# Patient Record
Sex: Male | Born: 1960 | Race: White | Hispanic: No | Marital: Married | Smoking: Former smoker
Health system: Southern US, Community
[De-identification: ages and names within clinical notes are randomized; demographics above are authoritative.]

## PROBLEM LIST (undated history)

## (undated) DIAGNOSIS — M48061 Spinal stenosis, lumbar region without neurogenic claudication: Secondary | ICD-10-CM

## (undated) DIAGNOSIS — I5189 Other ill-defined heart diseases: Secondary | ICD-10-CM

## (undated) DIAGNOSIS — R918 Other nonspecific abnormal finding of lung field: Secondary | ICD-10-CM

## (undated) DIAGNOSIS — I700 Atherosclerosis of aorta: Secondary | ICD-10-CM

## (undated) DIAGNOSIS — J439 Emphysema, unspecified: Secondary | ICD-10-CM

## (undated) DIAGNOSIS — R7303 Prediabetes: Secondary | ICD-10-CM

## (undated) DIAGNOSIS — M47816 Spondylosis without myelopathy or radiculopathy, lumbar region: Secondary | ICD-10-CM

## (undated) DIAGNOSIS — I10 Essential (primary) hypertension: Secondary | ICD-10-CM

## (undated) DIAGNOSIS — E785 Hyperlipidemia, unspecified: Secondary | ICD-10-CM

## (undated) DIAGNOSIS — I2510 Atherosclerotic heart disease of native coronary artery without angina pectoris: Secondary | ICD-10-CM

## (undated) HISTORY — DX: Essential (primary) hypertension: I10

---

## 2009-10-25 ENCOUNTER — Encounter: Payer: Self-pay | Admitting: Cardiovascular Disease

## 2009-11-17 ENCOUNTER — Ambulatory Visit: Payer: Self-pay | Admitting: Cardiovascular Disease

## 2009-11-17 DIAGNOSIS — I10 Essential (primary) hypertension: Secondary | ICD-10-CM | POA: Insufficient documentation

## 2009-11-17 DIAGNOSIS — R011 Cardiac murmur, unspecified: Secondary | ICD-10-CM

## 2010-02-20 NOTE — Letter (Signed)
Summary: [facility]   Imported By: Marylou Mccoy 12/29/2009 12:23:04  _____________________________________________________________________  External Attachment:    Type:   Image     Comment:   External Document

## 2010-02-20 NOTE — Assessment & Plan Note (Signed)
Summary: NP6/AMD   Visit Type:  Initial Consult Primary Provider:  Archinal,Ginette  CC:  Was told has a heart murmur. Denies chest pain or shortness of breath..  History of Present Illness: very pleasant 50 year old gentleman with a long history of smoking for 30 years, hypertension, who presents by referral from Dr. Andrey Spearman for murmur.  He reports that he is active, works at [employer], has no symptoms of shortness of breath or chest discomfort. Growing up, he has had no problems with activities and keeping up with other people. He denies any lower extremity edema, lightheadedness or dizziness.  He has been on blood pressure medication for low has been out of medication for some time. He was recently started on lisinopril 10 mg daily. he reports that his blood pressures at home have been in the 140/80 range. This number tends to drift downward turn the latter part of the day with systolics in the 130s.   EKG shows normal sinus rhythm with rate of 79 beats per minute, no significant ST or T wave changes  Current Medications (verified): 1)  Lisinopril 10 Mg Tabs (Lisinopril) .... One Tablet Once Daily  Allergies (verified): No Known Drug Allergies  Past History:  Past Medical History: Last updated: 11/27/2009 Hypertension  Family History: Last updated: 27-Nov-2009 Father: lung cancer; deceased Mother: emphysema. liver disease; deceased age 24  Siblings: Hypertension  Social History: Last updated: 2009-11-27 Married  Retired--Air Warehouse manager (desk job) Works at [employer] in the [employer]. Tobacco Use - Yes. smokes 1 1/2 PPD, has smoked for 30 to 4 yrs. Alcohol Use - no Drug Use - no Regular Exercise - yes  Risk Factors: Exercise: yes (11-27-2009)  Risk Factors: Smoking Status: current (11-27-2009)  Review of Systems  The patient denies fever, weight loss, weight gain, vision loss, decreased hearing, hoarseness, chest pain, syncope, dyspnea on  exertion, peripheral edema, prolonged cough, abdominal pain, incontinence, muscle weakness, depression, and enlarged lymph nodes.    Vital Signs:  Patient profile:   50 year old male Height:      63 inches Weight:      154 pounds BMI:     27.38 Pulse rate:   79 / minute BP sitting:   161 / 95  (left arm) Cuff size:   regular  Vitals Entered By: Bishop Dublin, CMA (November 17, 2009 2:39 PM)  Physical Exam  General:  Well developed, well nourished, in no acute distress. Head:  normocephalic and atraumatic Neck:  Neck supple, no JVD. No masses, thyromegaly or abnormal cervical nodes. Lungs:  Clear bilaterally to auscultation and percussion. Heart:  Non-displaced PMI, chest non-tender; regular rate and rhythm, S1, S2 with II-III/VI SEM RSB radiating to LSB and carotids, no rubs or gallops. Carotid upstroke normal, no bruit. Normal abdominal aortic size, no bruits. Femorals normal pulses, no bruits. Pedals normal pulses. No edema, no varicosities. Abdomen:  Bowel sounds positive; abdomen soft and non-tender without masses Msk:  Back normal, normal gait. Muscle strength and tone normal. Pulses:  pulses normal in all 4 extremities Extremities:  No clubbing or cyanosis. Neurologic:  Alert and oriented x 3. Skin:  Intact without lesions or rashes. Psych:  Normal affect.   Impression & Recommendations:  Problem # 1:  UNDIAGNOSED CARDIAC MURMURS (ICD-785.2) etiology of murmur is uncertain though characteristic of an aortic valve pathology. Unable to definitively exclude pulmonary valve or tricuspid valve regurgitation.   we have indicated that he may benefit from an echocardiogram to evaluate  his cardiac function, and his valves. He is out of network with [facility] and we had mentioned that we could set him up with an echocardiogram with an alternate group in [place] that is under [employer] called [facility]. He would like to call us back when he would like to have this  scheduled. He is indicated that he has followup with Dr. Andrey Spearman in the first week of November and would like to meet with her first before scheduling any testing. At anypoint we could set up the echocardiogram for him.  The results of the echocardiogram would help determine any further course of management. He is relatively asymptomatic and the echocardiogram can be done at any time.   His updated medication list for this problem includes:    Lisinopril 10 Mg Tabs (Lisinopril) ..... One tablet once daily  Orders: EKG w/ Interpretation (93000)  Problem # 2:  HYPERTENSION, BENIGN (ICD-401.1) Blood pressure is elevated today there he reports improved numbers at home. I suspect he might need an additional dose increased to have asked him to write down his numbers for Dr. Andrey Spearman before his visit with her in 2 weeks' time.  His updated medication list for this problem includes:    Lisinopril 10 Mg Tabs (Lisinopril) ..... One tablet once daily

## 2010-08-07 ENCOUNTER — Encounter: Payer: Self-pay | Admitting: Cardiovascular Disease

## 2011-09-17 ENCOUNTER — Ambulatory Visit: Payer: Self-pay | Admitting: Family Medicine

## 2013-06-11 ENCOUNTER — Ambulatory Visit: Payer: Self-pay | Admitting: Podiatry

## 2013-06-29 ENCOUNTER — Encounter: Payer: Self-pay | Admitting: Podiatry

## 2013-06-29 ENCOUNTER — Ambulatory Visit: Admitting: Podiatry

## 2013-06-29 ENCOUNTER — Encounter (INDEPENDENT_AMBULATORY_CARE_PROVIDER_SITE_OTHER): Payer: Self-pay

## 2013-06-29 VITALS — BP 109/71 | HR 93 | Resp 16 | Ht 63.0 in | Wt 140.0 lb

## 2013-06-29 DIAGNOSIS — B351 Tinea unguium: Secondary | ICD-10-CM

## 2013-06-29 DIAGNOSIS — M79609 Pain in unspecified limb: Secondary | ICD-10-CM

## 2013-06-29 DIAGNOSIS — B079 Viral wart, unspecified: Secondary | ICD-10-CM

## 2013-06-29 MED ORDER — TERBINAFINE HCL 250 MG PO TABS: 250.0000 mg | ORAL_TABLET | Freq: Every day | ORAL | Status: DC

## 2013-06-29 NOTE — Progress Notes (Signed)
Subjective:     Patient ID: Dean Obrien, male   DOB: 1960-07-06, 53 y.o.   MRN: [id number]  Foot Pain   patient presents with severe calluses underneath the right foot and yellow discolored nailbeds 1-5 both feet that can become painful. He states that the lesions are very difficult for him to walk with  and he tries to trim he only gets several weeks of relief   Review of Systems  All other systems reviewed and are negative.      Objective:   Physical Exam  Nursing note and vitals reviewed. Cardiovascular: Intact distal pulses.   Musculoskeletal: Normal range of motion.  Neurological: He is alert.  Skin: Skin is warm.   neurovascular status is intact range of motion adequate of the subtalar and midtarsal joint and no equinus condition is noted. Patient is found to have a severe lesion in the right arch around the right second metatarsal and underneath the left second metatarsal with a 1 in the arch been deepest and most painful. Nailbeds are thick yellow with discomfort upon pressure     Assessment:     Severe lesions which may be verruca in nature or porokeratosis. Mycotic nail infection 1-5 both feet    Plan:     H&P reviewed and deep debridement accomplished of lesions with chemical applied to the right lesion arch. Debridement nailbeds 1-5 both feet and placed on Lamisil 250 mg daily with blood work been ordered for patient

## 2013-06-29 NOTE — Progress Notes (Signed)
   Subjective:    Patient ID: Dean Obrien, male    DOB: [birth year], 53 y.o.   MRN: [id number]  HPI Comments: i have a callus on both of my feet and i need my toenails trimmed. i have had the callus for 2 years. They do hurt. i went to dr cline last year and he scraped the dead skin off and that was it. i have tried shoe inserts, callus pads, and i will pick the skin off. It hurts to walk. My toenails do not hurt. They have yellow fungus. Dr cline would not do anything for my toenails and would not trim them. i trim my toenails.  Foot Pain      Review of Systems  All other systems reviewed and are negative.      Objective:   Physical Exam        Assessment & Plan:

## 2013-06-30 LAB — HEPATIC FUNCTION PANEL
ALBUMIN: 4.3 g/dL (ref 3.5–5.5)
ALT: 13 IU/L (ref 0–44)
AST: 12 IU/L (ref 0–40)
Alkaline Phosphatase: 61 IU/L (ref 39–117)
Bilirubin, Direct: 0.1 mg/dL (ref 0.00–0.40)
TOTAL PROTEIN: 6.7 g/dL (ref 6.0–8.5)
Total Bilirubin: 0.2 mg/dL (ref 0.0–1.2)

## 2013-06-30 LAB — CBC WITH DIFFERENTIAL/PLATELET
BASOS: 0 %
Basophils Absolute: 0 10*3/uL (ref 0.0–0.2)
EOS ABS: 0.3 10*3/uL (ref 0.0–0.4)
Eos: 2 %
HEMATOCRIT: 42.4 % (ref 37.5–51.0)
HEMOGLOBIN: 15 g/dL (ref 12.6–17.7)
Immature Grans (Abs): 0 10*3/uL (ref 0.0–0.1)
Immature Granulocytes: 0 %
LYMPHS ABS: 2.4 10*3/uL (ref 0.7–3.1)
LYMPHS: 20 %
MCH: 31.4 pg (ref 26.6–33.0)
MCHC: 35.4 g/dL (ref 31.5–35.7)
MCV: 89 fL (ref 79–97)
MONOCYTES: 9 %
Monocytes Absolute: 1.1 10*3/uL — ABNORMAL HIGH (ref 0.1–0.9)
NEUTROS ABS: 8.1 10*3/uL — AB (ref 1.4–7.0)
Neutrophils Relative %: 69 %
RBC: 4.77 x10E6/uL (ref 4.14–5.80)
RDW: 13.1 % (ref 12.3–15.4)
WBC: 12 10*3/uL — ABNORMAL HIGH (ref 3.4–10.8)

## 2013-09-10 ENCOUNTER — Ambulatory Visit: Payer: Self-pay | Admitting: Family Medicine

## 2013-12-30 ENCOUNTER — Ambulatory Visit: Payer: Self-pay | Admitting: Gastroenterology

## 2014-08-22 ENCOUNTER — Other Ambulatory Visit: Payer: Self-pay

## 2014-08-23 MED ORDER — LORATADINE 10 MG PO TABS: 10.0000 mg | ORAL_TABLET | Freq: Every day | ORAL | Status: DC

## 2014-08-29 NOTE — Telephone Encounter (Signed)
I have tried calling this patient several times and no response.

## 2018-09-09 DIAGNOSIS — I350 Nonrheumatic aortic (valve) stenosis: Secondary | ICD-10-CM

## 2018-09-09 HISTORY — DX: Nonrheumatic aortic (valve) stenosis: I35.0

## 2019-09-08 ENCOUNTER — Emergency Department

## 2019-09-08 ENCOUNTER — Other Ambulatory Visit: Payer: Self-pay

## 2019-09-08 DIAGNOSIS — I350 Nonrheumatic aortic (valve) stenosis: Secondary | ICD-10-CM | POA: Diagnosis not present

## 2019-09-08 DIAGNOSIS — I214 Non-ST elevation (NSTEMI) myocardial infarction: Secondary | ICD-10-CM | POA: Insufficient documentation

## 2019-09-08 DIAGNOSIS — E876 Hypokalemia: Secondary | ICD-10-CM | POA: Diagnosis not present

## 2019-09-08 DIAGNOSIS — Z79899 Other long term (current) drug therapy: Secondary | ICD-10-CM | POA: Diagnosis not present

## 2019-09-08 DIAGNOSIS — R55 Syncope and collapse: Secondary | ICD-10-CM | POA: Diagnosis present

## 2019-09-08 DIAGNOSIS — F17200 Nicotine dependence, unspecified, uncomplicated: Secondary | ICD-10-CM | POA: Insufficient documentation

## 2019-09-08 DIAGNOSIS — Z20822 Contact with and (suspected) exposure to covid-19: Secondary | ICD-10-CM | POA: Insufficient documentation

## 2019-09-08 DIAGNOSIS — I248 Other forms of acute ischemic heart disease: Secondary | ICD-10-CM | POA: Diagnosis not present

## 2019-09-08 DIAGNOSIS — I10 Essential (primary) hypertension: Secondary | ICD-10-CM | POA: Diagnosis not present

## 2019-09-08 LAB — BASIC METABOLIC PANEL
Anion gap: 11 (ref 5–15)
BUN: 12 mg/dL (ref 6–20)
CO2: 25 mmol/L (ref 22–32)
Calcium: 9.2 mg/dL (ref 8.9–10.3)
Chloride: 101 mmol/L (ref 98–111)
Creatinine, Ser: 0.86 mg/dL (ref 0.61–1.24)
GFR calc Af Amer: >60 mL/min (ref >60)
GFR calc non Af Amer: >60 mL/min (ref >60)
Glucose, Bld: 104 mg/dL — ABNORMAL HIGH (ref 70–99)
Potassium: 3.2 mmol/L — ABNORMAL LOW (ref 3.5–5.1)
Sodium: 137 mmol/L (ref 135–145)

## 2019-09-08 LAB — CBC
HCT: 43.7 % (ref 39.0–52.0)
Hemoglobin: 14.9 g/dL (ref 13.0–17.0)
MCH: 31.3 pg (ref 26.0–34.0)
MCHC: 34.1 g/dL (ref 30.0–36.0)
MCV: 91.8 fL (ref 80.0–100.0)
Platelets: 180 10*3/uL (ref 150–400)
RBC: 4.76 MIL/uL (ref 4.22–5.81)
RDW: 13.1 % (ref 11.5–15.5)
WBC: 13.9 10*3/uL — ABNORMAL HIGH (ref 4.0–10.5)
nRBC: 0 % (ref 0.0–0.2)

## 2019-09-08 LAB — TROPONIN I (HIGH SENSITIVITY): Troponin I (High Sensitivity): 48 ng/L — ABNORMAL HIGH (ref <18)

## 2019-09-08 NOTE — ED Triage Notes (Addendum)
Pt arrives via [facility] from home. Pt reports he was outside in the yard cranking a chainsaw when he started to feel dizzy and nauseous. Pt reports the next thing he remembers is being woke up by EMS. Pt unsure if he fell but states he was on the floor upon awakening. Pt denies CP. Pt in NAD, skin warm and dry, speech clear. Pt arrives with 18 gauge to the [facility]

## 2019-09-09 ENCOUNTER — Observation Stay
Admission: EM | Admit: 2019-09-09 | Discharge: 2019-09-10 | Disposition: A | Discharge status: Home / Self Care | Attending: Internal Medicine | Admitting: Internal Medicine

## 2019-09-09 ENCOUNTER — Observation Stay (HOSPITAL_BASED_OUTPATIENT_CLINIC_OR_DEPARTMENT_OTHER)
Admit: 2019-09-09 | Discharge: 2019-09-09 | Disposition: A | Discharge status: Home / Self Care | Attending: Cardiovascular Disease | Admitting: Cardiovascular Disease

## 2019-09-09 ENCOUNTER — Other Ambulatory Visit: Payer: Self-pay

## 2019-09-09 DIAGNOSIS — Z87898 Personal history of other specified conditions: Secondary | ICD-10-CM | POA: Diagnosis present

## 2019-09-09 DIAGNOSIS — R55 Syncope and collapse: Secondary | ICD-10-CM | POA: Diagnosis not present

## 2019-09-09 DIAGNOSIS — I2489 Other forms of acute ischemic heart disease: Secondary | ICD-10-CM | POA: Diagnosis present

## 2019-09-09 DIAGNOSIS — I350 Nonrheumatic aortic (valve) stenosis: Secondary | ICD-10-CM

## 2019-09-09 DIAGNOSIS — E876 Hypokalemia: Secondary | ICD-10-CM

## 2019-09-09 DIAGNOSIS — I10 Essential (primary) hypertension: Secondary | ICD-10-CM | POA: Diagnosis present

## 2019-09-09 DIAGNOSIS — I248 Other forms of acute ischemic heart disease: Secondary | ICD-10-CM | POA: Diagnosis not present

## 2019-09-09 DIAGNOSIS — F17200 Nicotine dependence, unspecified, uncomplicated: Secondary | ICD-10-CM | POA: Diagnosis present

## 2019-09-09 DIAGNOSIS — I214 Non-ST elevation (NSTEMI) myocardial infarction: Secondary | ICD-10-CM

## 2019-09-09 DIAGNOSIS — I340 Nonrheumatic mitral (valve) insufficiency: Secondary | ICD-10-CM

## 2019-09-09 HISTORY — DX: Nonrheumatic aortic (valve) stenosis: I35.0

## 2019-09-09 HISTORY — DX: Non-ST elevation (NSTEMI) myocardial infarction: I21.4

## 2019-09-09 LAB — BASIC METABOLIC PANEL
Anion gap: 11 (ref 5–15)
BUN: 12 mg/dL (ref 6–20)
CO2: 23 mmol/L (ref 22–32)
Calcium: 9.2 mg/dL (ref 8.9–10.3)
Chloride: 104 mmol/L (ref 98–111)
Creatinine, Ser: 0.6 mg/dL — ABNORMAL LOW (ref 0.61–1.24)
GFR calc Af Amer: >60 mL/min (ref >60)
GFR calc non Af Amer: >60 mL/min (ref >60)
Glucose, Bld: 117 mg/dL — ABNORMAL HIGH (ref 70–99)
Potassium: 3.8 mmol/L (ref 3.5–5.1)
Sodium: 138 mmol/L (ref 135–145)

## 2019-09-09 LAB — ECHOCARDIOGRAM COMPLETE
AR max vel: 0.41 cm2
AV Area VTI: 0.42 cm2
AV Area mean vel: 0.39 cm2
AV Mean grad: 84.7 mmHg
AV Peak grad: 121.6 mmHg
Ao pk vel: 5.51 m/s
Area-P 1/2: 3.13 cm2
S' Lateral: 2.89 cm
Weight: 2081.14 oz

## 2019-09-09 LAB — URINALYSIS, COMPLETE (UACMP) WITH MICROSCOPIC
Bacteria, UA: NONE SEEN
Bilirubin Urine: NEGATIVE
Glucose, UA: NEGATIVE mg/dL
Ketones, ur: NEGATIVE mg/dL
Leukocytes,Ua: NEGATIVE
Nitrite: NEGATIVE
Protein, ur: NEGATIVE mg/dL
Specific Gravity, Urine: 1.004 — ABNORMAL LOW (ref 1.005–1.030)
Squamous Epithelial / HPF: NONE SEEN (ref 0–5)
WBC, UA: NONE SEEN WBC/hpf (ref 0–5)
pH: 7 (ref 5.0–8.0)

## 2019-09-09 LAB — CBC
HCT: 47.2 % (ref 39.0–52.0)
Hemoglobin: 16.2 g/dL (ref 13.0–17.0)
MCH: 31.3 pg (ref 26.0–34.0)
MCHC: 34.3 g/dL (ref 30.0–36.0)
MCV: 91.1 fL (ref 80.0–100.0)
Platelets: 199 10*3/uL (ref 150–400)
RBC: 5.18 MIL/uL (ref 4.22–5.81)
RDW: 13.2 % (ref 11.5–15.5)
WBC: 14.9 10*3/uL — ABNORMAL HIGH (ref 4.0–10.5)
nRBC: 0 % (ref 0.0–0.2)

## 2019-09-09 LAB — LIPID PANEL
Cholesterol: 146 mg/dL (ref 0–200)
HDL: 50 mg/dL (ref >40)
LDL Cholesterol: 87 mg/dL (ref 0–99)
Total CHOL/HDL Ratio: 2.9 RATIO
Triglycerides: 46 mg/dL (ref <150)
VLDL: 9 mg/dL (ref 0–40)

## 2019-09-09 LAB — HEPATIC FUNCTION PANEL
ALT: 15 U/L (ref 0–44)
AST: 16 U/L (ref 15–41)
Albumin: 3 g/dL — ABNORMAL LOW (ref 3.5–5.0)
Alkaline Phosphatase: 37 U/L — ABNORMAL LOW (ref 38–126)
Bilirubin, Direct: 0.1 mg/dL (ref 0.0–0.2)
Indirect Bilirubin: 0.5 mg/dL (ref 0.3–0.9)
Total Bilirubin: 0.6 mg/dL (ref 0.3–1.2)
Total Protein: 5.3 g/dL — ABNORMAL LOW (ref 6.5–8.1)

## 2019-09-09 LAB — CK: Total CK: 100 U/L (ref 49–397)

## 2019-09-09 LAB — SARS CORONAVIRUS 2 BY RT PCR (HOSPITAL ORDER, PERFORMED IN ~~LOC~~ HOSPITAL LAB): SARS Coronavirus 2: NEGATIVE

## 2019-09-09 LAB — TROPONIN I (HIGH SENSITIVITY)
Troponin I (High Sensitivity): 132 ng/L (ref <18)
Troponin I (High Sensitivity): 165 ng/L (ref <18)

## 2019-09-09 LAB — PROTIME-INR
INR: 1.1 (ref 0.8–1.2)
Prothrombin Time: 14 seconds (ref 11.4–15.2)

## 2019-09-09 LAB — TSH: TSH: 1.224 u[IU]/mL (ref 0.350–4.500)

## 2019-09-09 LAB — HEPARIN LEVEL (UNFRACTIONATED)
Heparin Unfractionated: <0.1 IU/mL — ABNORMAL LOW (ref 0.30–0.70)
Heparin Unfractionated: 0.2 IU/mL — ABNORMAL LOW (ref 0.30–0.70)

## 2019-09-09 LAB — HEMOGLOBIN A1C
Hgb A1c MFr Bld: 5.8 % — ABNORMAL HIGH (ref 4.8–5.6)
Mean Plasma Glucose: 119.76 mg/dL

## 2019-09-09 LAB — HIV ANTIBODY (ROUTINE TESTING W REFLEX): HIV Screen 4th Generation wRfx: NONREACTIVE

## 2019-09-09 LAB — APTT: aPTT: 31 seconds (ref 24–36)

## 2019-09-09 LAB — MAGNESIUM: Magnesium: 1.5 mg/dL — ABNORMAL LOW (ref 1.7–2.4)

## 2019-09-09 MED ORDER — HEPARIN BOLUS VIA INFUSION
2000.0000 [IU] | Freq: Once | INTRAVENOUS | Status: AC
  Administered 2019-09-09: 2000 [IU] via INTRAVENOUS
  Filled 2019-09-09: qty 2000

## 2019-09-09 MED ORDER — SODIUM CHLORIDE 0.9 % IV SOLN: 250.0000 mL | INTRAVENOUS | Status: DC | PRN

## 2019-09-09 MED ORDER — METOPROLOL TARTRATE 25 MG PO TABS
12.5000 mg | ORAL_TABLET | Freq: Two times a day (BID) | ORAL | Status: DC
  Administered 2019-09-09 – 2019-09-10 (×4): 12.5 mg via ORAL
  Filled 2019-09-09 (×5): qty 1

## 2019-09-09 MED ORDER — SODIUM CHLORIDE 0.9 % WEIGHT BASED INFUSION: 10.0000 mL/h | INTRAVENOUS | Status: DC

## 2019-09-09 MED ORDER — ONDANSETRON HCL 4 MG/2ML IJ SOLN: 4.0000 mg | Freq: Four times a day (QID) | INTRAMUSCULAR | Status: DC | PRN

## 2019-09-09 MED ORDER — HEPARIN (PORCINE) 25000 UT/250ML-% IV SOLN
1200.0000 [IU]/h | INTRAVENOUS | Status: DC
  Administered 2019-09-09: 800 [IU]/h via INTRAVENOUS
  Administered 2019-09-09: 1050 [IU]/h via INTRAVENOUS
  Filled 2019-09-09: qty 250

## 2019-09-09 MED ORDER — ASPIRIN 81 MG PO CHEW
81.0000 mg | CHEWABLE_TABLET | ORAL | Status: AC
  Administered 2019-09-10: 81 mg via ORAL
  Filled 2019-09-09: qty 1

## 2019-09-09 MED ORDER — HEPARIN (PORCINE) 25000 UT/250ML-% IV SOLN
INTRAVENOUS | Status: AC
  Administered 2019-09-09: 800 [IU]/h
  Filled 2019-09-09: qty 250

## 2019-09-09 MED ORDER — ACETAMINOPHEN 325 MG PO TABS: 650.0000 mg | ORAL_TABLET | ORAL | Status: DC | PRN

## 2019-09-09 MED ORDER — HEPARIN BOLUS VIA INFUSION
2000.0000 [IU] | Freq: Once | INTRAVENOUS | Status: AC
  Administered 2019-09-10: 2000 [IU] via INTRAVENOUS
  Filled 2019-09-09: qty 2000

## 2019-09-09 MED ORDER — SODIUM CHLORIDE 0.9% FLUSH
3.0000 mL | Freq: Two times a day (BID) | INTRAVENOUS | Status: DC
  Administered 2019-09-10: 3 mL via INTRAVENOUS

## 2019-09-09 MED ORDER — SODIUM CHLORIDE 0.9% FLUSH: 3.0000 mL | INTRAVENOUS | Status: DC | PRN

## 2019-09-09 MED ORDER — ASPIRIN 81 MG PO CHEW
CHEWABLE_TABLET | ORAL | Status: AC
  Administered 2019-09-09: 324 mg
  Filled 2019-09-09: qty 4

## 2019-09-09 MED ORDER — NICOTINE 21 MG/24HR TD PT24
21.0000 mg | MEDICATED_PATCH | Freq: Every day | TRANSDERMAL | Status: DC
  Administered 2019-09-09 – 2019-09-10 (×2): 21 mg via TRANSDERMAL
  Filled 2019-09-09 (×2): qty 1

## 2019-09-09 MED ORDER — ATORVASTATIN CALCIUM 20 MG PO TABS
40.0000 mg | ORAL_TABLET | Freq: Every day | ORAL | Status: DC
  Administered 2019-09-09: 40 mg via ORAL
  Filled 2019-09-09: qty 2

## 2019-09-09 MED ORDER — ASPIRIN EC 81 MG PO TBEC: 81.0000 mg | DELAYED_RELEASE_TABLET | Freq: Every day | ORAL | Status: DC

## 2019-09-09 MED ORDER — NITROGLYCERIN 0.4 MG SL SUBL: 0.4000 mg | SUBLINGUAL_TABLET | SUBLINGUAL | Status: DC | PRN

## 2019-09-09 MED ORDER — SODIUM CHLORIDE 0.9 % WEIGHT BASED INFUSION
10.0000 mL/h | INTRAVENOUS | Status: DC
  Administered 2019-09-10: 10 mL/h via INTRAVENOUS

## 2019-09-09 MED ORDER — POTASSIUM CHLORIDE CRYS ER 20 MEQ PO TBCR
40.0000 meq | EXTENDED_RELEASE_TABLET | Freq: Two times a day (BID) | ORAL | Status: AC
  Administered 2019-09-09 (×2): 40 meq via ORAL
  Filled 2019-09-09 (×2): qty 2

## 2019-09-09 MED ORDER — HEPARIN BOLUS VIA INFUSION
4000.0000 [IU] | Freq: Once | INTRAVENOUS | Status: AC
  Administered 2019-09-09: 4000 [IU] via INTRAVENOUS
  Filled 2019-09-09: qty 4000

## 2019-09-09 NOTE — ED Provider Notes (Signed)
-----------------------------------------   3:26 AM on 09/09/2019 -----------------------------------------  Patient was seen during computer downtime.  Please refer to paper H&P.  Heparin bolus and drip were initiated and patient was admitted to hospitalist services.   Paulette Blanch, MD 09/09/19 [phone]

## 2019-09-09 NOTE — H&P (Signed)
History and Physical    Dean Obrien PVV:[id number] DOB: Mar 03, 1960 DOA: 09/09/2019  PCP: Steele Sizer, MD   Patient coming from: Home  I have personally briefly reviewed patient's old medical records in [place]  Chief Complaint: Chest pain  HPI: Dean Obrien is a 59 y.o. male with medical history significant for hypertension and nicotine dependence, presenting to the ED after he developed chest pain while outside doing yard work.  The chest pain resolved with rest but when he went inside he had a syncopal episode, awakening to feel a little dizzy but without recurrence of the chest pain and decided to come in to the emergency room to be evaluated.  The pain was described as retrosternal tightness, nonradiating with no associated nausea vomiting or diaphoresis of low intensity.  Chest pain-free on arrival ED Course: On arrival, vitals were within normal limits.  Initial EKG showed no acute ST-T wave changes.  Troponin 48/132/165.  Blood work otherwise unremarkable except for slightly elevated WBC of 13,000.  Chest x-ray and head CT unremarkable.  Patient started on heparin infusion.  Hospitalist consulted for admission  Review of Systems: As per HPI otherwise all other systems on review of systems negative.    Past Medical History:  Diagnosis Date  . Hypertension     History reviewed. No pertinent surgical history.   reports that he has been smoking. He has a 52.50 pack-year smoking history. He does not have any smokeless tobacco history on file. He reports that he does not drink alcohol and does not use drugs.  Allergies  Allergen Reactions  . Benadryl [Diphenhydramine]     Racing heart, syncope    Family History  Problem Relation Age of Onset  . Emphysema Mother   . Liver disease Mother   . Lung cancer Father   . Hypertension Sister   . Hypertension Brother       Prior to Admission medications   Medication Sig Start Date End Date Taking? Authorizing  Provider  lisinopril (PRINIVIL,ZESTRIL) 10 MG tablet Take 10 mg by mouth daily.   Patient not taking: Reported on 09/09/2019    [provider]  loratadine (CLARITIN) 10 MG tablet Take 1 tablet (10 mg total) by mouth daily. Patient not taking: Reported on 09/09/2019 08/23/14   Steele Sizer, MD  terbinafine (LAMISIL) 250 MG tablet Take 1 tablet (250 mg total) by mouth daily. Patient not taking: Reported on 09/09/2019 06/29/13   Wallene Huh, DPM    Physical Exam: Vitals:   09/09/19 0115 09/09/19 0145 09/09/19 0230 09/09/19 0330  BP:      Pulse: 73 76 67 68  Resp: (!) 26 16 14 16   Temp:      TempSrc:      SpO2: 96% 93% 94% 93%  Weight:      Height:         Vitals:   09/09/19 0115 09/09/19 0145 09/09/19 0230 09/09/19 0330  BP:      Pulse: 73 76 67 68  Resp: (!) 26 16 14 16   Temp:      TempSrc:      SpO2: 96% 93% 94% 93%  Weight:      Height:          Constitutional: Alert and oriented x 3 . Not in any apparent distress HEENT:      Head: Normocephalic and atraumatic.         Eyes: PERLA, EOMI, Conjunctivae are normal. Sclera is non-icteric.  Mouth/Throat: Mucous membranes are moist.       Neck: Supple with no signs of meningismus. Cardiovascular: Regular rate and rhythm. 3/6 murmur, no gallops, or rubs. 2+ symmetrical distal pulses are present . No JVD. No LE edema Respiratory: Respiratory effort normal .Lungs sounds clear bilaterally. No wheezes, crackles, or rhonchi.  Gastrointestinal: Soft, non tender, and non distended with positive bowel sounds. No rebound or guarding. Genitourinary: No CVA tenderness. Musculoskeletal: Nontender with normal range of motion in all extremities. No cyanosis, or erythema of extremities. Neurologic: Normal speech and language. Face is symmetric. Moving all extremities. No gross focal neurologic deficits . Skin: Skin is warm, dry.  No rash or ulcers Psychiatric: Mood and affect are normal Speech and behavior are  normal   Labs on Admission: I have personally reviewed following labs and imaging studies  CBC: Recent Labs  Lab 09/08/19 1755  WBC 13.9*  HGB 14.9  HCT 43.7  MCV 91.8  PLT 277   Basic Metabolic Panel: Recent Labs  Lab 09/08/19 1755  NA 137  K 3.2*  CL 101  CO2 25  GLUCOSE 104*  BUN 12  CREATININE 0.86  CALCIUM 9.2   GFR: Estimated Creatinine Clearance: 75.4 mL/min (by C-G formula based on SCr of 0.86 mg/dL). Liver Function Tests: No results for input(s): AST, ALT, ALKPHOS, BILITOT, PROT, ALBUMIN in the last 168 hours. No results for input(s): LIPASE, AMYLASE in the last 168 hours. No results for input(s): AMMONIA in the last 168 hours. Coagulation Profile: No results for input(s): INR, PROTIME in the last 168 hours. Cardiac Enzymes: Recent Labs  Lab 09/09/19 0018  CKTOTAL 100   BNP (last 3 results) No results for input(s): PROBNP in the last 8760 hours. HbA1C: No results for input(s): HGBA1C in the last 72 hours. CBG: No results for input(s): GLUCAP in the last 168 hours. Lipid Profile: No results for input(s): CHOL, HDL, LDLCALC, TRIG, CHOLHDL, LDLDIRECT in the last 72 hours. Thyroid Function Tests: No results for input(s): TSH, T4TOTAL, FREET4, T3FREE, THYROIDAB in the last 72 hours. Anemia Panel: No results for input(s): VITAMINB12, FOLATE, FERRITIN, TIBC, IRON, RETICCTPCT in the last 72 hours. Urine analysis:    Component Value Date/Time   COLORURINE STRAW (A) 09/09/2019 0010   APPEARANCEUR CLEAR (A) 09/09/2019 0010   LABSPEC 1.004 (L) 09/09/2019 0010   PHURINE 7.0 09/09/2019 0010   GLUCOSEU NEGATIVE 09/09/2019 0010   HGBUR MODERATE (A) 09/09/2019 0010   BILIRUBINUR NEGATIVE 09/09/2019 0010   KETONESUR NEGATIVE 09/09/2019 0010   PROTEINUR NEGATIVE 09/09/2019 0010   NITRITE NEGATIVE 09/09/2019 0010   LEUKOCYTESUR NEGATIVE 09/09/2019 0010    Radiological Exams on Admission: DG Chest 2 View  Result Date: 09/08/2019 CLINICAL DATA:  Syncope.  EXAM: CHEST - 2 VIEW COMPARISON:  September 17, 2011 FINDINGS: Mild, diffuse chronic appearing increased lung markings are seen without evidence of acute infiltrate, pleural effusion or pneumothorax. The heart size and mediastinal contours are within normal limits. The visualized skeletal structures are unremarkable. IMPRESSION: No active cardiopulmonary disease. Electronically Signed   By: Virgina Norfolk M.D.   On: 09/08/2019 18:59   CT Head Wo Contrast  Result Date: 09/09/2019 CLINICAL DATA:  Altered mental status.  Dizziness and nausea. EXAM: CT HEAD WITHOUT CONTRAST TECHNIQUE: Contiguous axial images were obtained from the base of the skull through the vertex without intravenous contrast. COMPARISON:  None. FINDINGS: Brain: No intracranial hemorrhage, mass effect, or midline shift. No hydrocephalus. The basilar cisterns are patent. No evidence of territorial infarct  or acute ischemia. No extra-axial or intracranial fluid collection. Vascular: No hyperdense vessel or unexpected calcification. Skull: No fracture or focal lesion. Sinuses/Orbits: Minimal opacification of a few left ethmoid air cells. The remaining paranasal sinuses are clear. Included orbits are unremarkable. Mastoid air cells are well aerated. Other: None. IMPRESSION: Negative noncontrast head CT. Electronically Signed   By: Keith Rake M.D.   On: 09/09/2019 00:11    EKG: Independently reviewed. Interpretation : Normal sinus rhythm  Assessment/Plan 59 year old male with a history of hypertension and nicotine dependence presenting with brief episode of chest pain followed with syncopal episode.  Troponin elevation consistent with NSTEMI    NSTEMI (non-ST elevated myocardial infarction) (HCC) -Troponin 48>>132>>165.  Initial EKG with no ST-T wave changes with repeat EKG showing minimal anterior ST elevation but with no chest pain at the time -Patient had exertional chest pain followed by a syncopal episode -Received aspirin in the  emergency room -Continue heparin infusion, aspirin, metoprolol and atorvastatin -Cardiology consult    Syncope and collapse -Concern for arrhythmia related to above -Head CT was negative -Cardiac monitoring for arrhythmias.  Neurologic checks -Echocardiogram in the a.m.    HYPERTENSION, BENIGN -Patient started on metoprolol  Nicotine dependence -Nicotine patch    DVT prophylaxis: Heparin infusion Code Status: full code  Family Communication:  none  Disposition Plan: Back to previous home environment Consults called: Cardiology Status: Observation    Athena Masse MD [facility]     09/09/2019, 4:09 AM

## 2019-09-09 NOTE — ED Notes (Signed)
Pt has not reported any chest pain, n/v/d, since LOC. Pt reports he felt a little dizzy immediately after LOC, but has "felt fine since"

## 2019-09-09 NOTE — Progress Notes (Signed)
ANTICOAGULATION CONSULT NOTE -  Pharmacy Consult for Heparin Indication: chest pain/ACS  Allergies  Allergen Reactions   Benadryl [Diphenhydramine]     Racing heart, syncope    Patient Measurements: Height: 5\' 3"  (160 cm) Weight: 59 kg (130 lb 1.1 oz) IBW/kg (Calculated) : 56.9 HEPARIN DW (KG): 65.8  Vital Signs: Temp: 98.2 F (36.8 C) (08/19 2040) Temp Source: Oral (08/19 2040) BP: 129/88 (08/19 2040) Pulse Rate: 73 (08/19 2040)  Labs: Recent Labs    09/08/19 1755 09/09/19 0018 09/09/19 0120 09/09/19 0310 09/09/19 1323 09/09/19 1350 09/09/19 2100  HGB 14.9  --  16.2  --   --   --   --   HCT 43.7  --  47.2  --   --   --   --   PLT 180  --  199  --   --   --   --   APTT  --   --  31  --   --   --   --   LABPROT  --   --   --   --   --  14.0  --   INR  --   --   --   --   --  1.1  --   HEPARINUNFRC  --   --   --   --   --  <0.10* 0.20*  CREATININE 0.86  --   --   --  0.60*  --   --   CKTOTAL  --  100  --   --   --   --   --   TROPONINIHS 48* 132*  --  165*  --   --   --     Estimated Creatinine Clearance: 81 mL/min (A) (by C-G formula based on SCr of 0.6 mg/dL (L)).   Medical History: Past Medical History:  Diagnosis Date   Hypertension     Medications/Assessment: Heparin initiated for ACS.  No anticoagulants per PTA med list.  Baseline labs ordered.  Hepain bolus 4000 units x 1 then infusion at 800 units/hr Check HL ~ 6 hours after heparin started  Goal of Therapy:  Heparin level 0.3-0.7 units/ml Monitor platelets by anticoagulation protocol: Yes   Plan:  0819 2100 HL 0.20 subtherapeutic Will give 2000 unit bolus and increase rate to 1200 units/hr  Will recheck HL in 6 hours per protocol  Ena Dawley, PharmD Clinical Pharmacist 09/09/2019 11:16 PM

## 2019-09-09 NOTE — Progress Notes (Signed)
ANTICOAGULATION CONSULT NOTE - Initial Consult  Pharmacy Consult for Heparin Indication: chest pain/ACS  Allergies  Allergen Reactions  . Benadryl [Diphenhydramine]     Racing heart, syncope    Patient Measurements: Height: 5\' 3"  (160 cm) Weight: 59 kg (130 lb 1.1 oz) IBW/kg (Calculated) : 56.9 HEPARIN DW (KG): 65.8  Vital Signs: Temp: 98.2 F (36.8 C) (08/18 2228) Temp Source: Oral (08/18 1751) BP: 131/86 (08/18 2228) Pulse Rate: 68 (08/19 0330)  Labs: Recent Labs    09/08/19 1755 09/09/19 0018 09/09/19 0120 09/09/19 0310  HGB 14.9  --   --   --   HCT 43.7  --   --   --   PLT 180  --   --   --   APTT  --   --  31  --   CREATININE 0.86  --   --   --   CKTOTAL  --  100  --   --   TROPONINIHS 48* 132*  --  165*    Estimated Creatinine Clearance: 75.4 mL/min (by C-G formula based on SCr of 0.86 mg/dL).   Medical History: Past Medical History:  Diagnosis Date  . Hypertension     Medications:  (Not in a hospital admission)   Assessment: Heparin initiated for ACS.  No anticoagulants per PTA med list.  Baseline labs ordered.  Goal of Therapy:  Heparin level 0.3-0.7 units/ml Monitor platelets by anticoagulation protocol: Yes   Plan:  Hepain bolus 4000 units x 1 then infusion at 800 units/hr Check HL ~ 6 hours after heparin started  Hart Robinsons A 09/09/2019,4:22 AM

## 2019-09-09 NOTE — ED Notes (Signed)
Messaged admitting provider to ask when she wanted another troponin level drawn

## 2019-09-09 NOTE — ED Notes (Signed)
Blue top, purple top, and covid swab sent to lab

## 2019-09-09 NOTE — H&P (View-Only) (Signed)
Cardiology Consultation:   Patient ID: Dean Obrien MRN: [id number]; DOB: December 03, 1960  Admit date: 09/09/2019 Date of Consult: 09/09/2019  Primary Care Provider: Patient, No Pcp Per Primary Cardiologist: [facility], Dr. Rockey Situ Primary Electrophysiologist:  None    Patient Profile:   Dean Obrien is a 59 y.o. male with a hx of HTN, tobacco use (at least 30 years), murmur, and who is being seen today for the evaluation of chest pain with syncopal episode at the request of Dr. Damita Dunnings.  History of Present Illness:   Dean Obrien is a 59 yo male with PMH as above.  He denies any family history of early cardiac death.  He reports a strong family history of hypertension.   He was previously evaluated by [facility] cardiology/Dr. Rockey Situ 11/17/2009 after referral from Dr. Oran Rein for cardiac murmur.  During his clinic visit, a murmur was noted on exam and classified as 0-6/2 systolic ejection murmur in the right upper sternal border and radiating to left sternal border and carotids.  It was thought that this murmur most likely represented aortic valve pathology; however, exam is unable to exclude pulmonary or tricuspid valve disease.  Echo was recommended for further evaluation.    It was noted he was out of network with [facility].  Our clinic attempted to thus set him up with an echo with an alternate group in [place] under [facility], called [facility].  Documentation states he requested to call [facility] back when ready to have this echo scheduled.  He wanted to discuss obtaining an echo with Dr. Oran Rein before proceeding forward.  On review of EMR, it appears as if the echo was never obtained.   At the time of his evaluation in 2011, he denied recent or past symptoms of valvular disease, including SOB/DOE and CP.  He was working on HTN / blood pressure control; however, he has been out of medications for some time.  Clinic BP 161/95 with home SBP 130-140s. He was maintained on  lisinopril 10mg , though it was noted that he would likely need increased dosing and recommended that he monitor his blood pressure at home for now.  Today, 09/09/2019, he presents to [facility] after an episode of chest pain, dyspnea, dizziness, and subsequent syncopal event.  Yesterday, 8/18, he was outside in his yard and doing yard work for some time. He remembers it was warm but otherwise felt fine. He was using his chain saw and reports he was vigorously cranking the chainsaw to start it, which he now believes triggered the subsequent symptoms / event. After several cranks of the chainsaw, he suddenly became extremely dizzy with acute SOB/dyspnea and nauseousness. He reports brief CP that felt like a burning or tightness in the center of his chest. This lasted approximately 10 seconds before he stopped using the chainsaw and came inside.    While inside, he was very short of breath and nauseated but denies further chest pain. Due to the nausea, he remembers coming down to the ground and being on his hands and knees before he lost consciousness. He does not feel that he hit his head, given he was already down on the floor.  The syncopal event was unwitnessed; however, he states his family was in a nearby room.  They peaked in on him soon after the event, he feels, and they noticed that he was unconscious.  His daughter tells him she estimates that he was out for 1 to 2 minutes at the most, based on their knowing the  time he came in from outside.   When he regained consciousness, his family was speaking with EMS.  He felt short of breath/nausea and also noted diaphoresis after regaining consciousness but denies any further CP, dizziness, or nausea and has not had any recurrent symptoms since that time.  He has not had an episode like this in the past. He reports a h/o rare palpitations with rare PACs and PVCs noted on telemetry. He denies any h/o pnd, orthopnea, edema, weight gain, or early satiety.     In the  ED, vitals stable. EKG without acute ST/T changes. Labs showed elevated HS Tn 48  132  165, Cr 0.86, K 3.2, WBC 13.9. UA showed moderate Hgb without bacteria or WBC seen. Head CT and CXR without acute finding. He was started on IV heparin and cardiology consulted.    No current chest pain, dizziness, DOE/SOB, nausea, or diaphoresis at the time of my exam.    Heart Pathway Score:     Past Medical History:  Diagnosis Date  . Hypertension     History reviewed. No pertinent surgical history.   Home Medications:  Prior to Admission medications   Medication Sig Start Date End Date Taking? Authorizing Provider  lisinopril (PRINIVIL,ZESTRIL) 10 MG tablet Take 10 mg by mouth daily.   Patient not taking: Reported on 09/09/2019    [provider]  loratadine (CLARITIN) 10 MG tablet Take 1 tablet (10 mg total) by mouth daily. Patient not taking: Reported on 09/09/2019 08/23/14   Steele Sizer, MD  terbinafine (LAMISIL) 250 MG tablet Take 1 tablet (250 mg total) by mouth daily. Patient not taking: Reported on 09/09/2019 06/29/13   Wallene Huh, DPM    Inpatient Medications: Scheduled Meds: . [START ON 09/10/2019] aspirin EC  81 mg Oral Daily  . atorvastatin  40 mg Oral Daily  . metoprolol tartrate  12.5 mg Oral BID  . nicotine  21 mg Transdermal Daily   Continuous Infusions: . heparin 800 Units/hr (09/09/19 0424)   PRN Meds: acetaminophen, nitroGLYCERIN, ondansetron (ZOFRAN) IV  Allergies:    Allergies  Allergen Reactions  . Benadryl [Diphenhydramine]     Racing heart, syncope    Social History:   Social History   Socioeconomic History  . Marital status: Married    Spouse name: Not on file  . Number of children: Not on file  . Years of education: Not on file  . Highest education level: Not on file  Occupational History  . Occupation: Social research officer, government (desk job)    Comment: Retired  . 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 Smoking status: Current Every  Day Smoker    Packs/day: 1.50    Years: 35.00    Pack years: 52.50  Substance and Sexual Activity  . Alcohol use: No  . Drug use: No  . Sexual activity: Not on file  Other Topics Concern  . Not on file  Social History Narrative   Pt gets regular exercise.   Social Determinants of Health   Financial Resource Strain:   . Difficulty of Paying Living Expenses: Not on file  Food Insecurity:   . Worried About Charity fundraiser in the Last Year: Not on file  . Ran Out of Food in the Last Year: Not on file  Transportation Needs:   . Lack of Transportation (Medical): Not on file  . Lack of Transportation (Non-Medical): Not on file  Physical Activity:   . Days of  Exercise per Week: Not on file  . Minutes of Exercise per Session: Not on file  Stress:   . Feeling of Stress : Not on file  Social Connections:   . Frequency of Communication with Friends and Family: Not on file  . Frequency of Social Gatherings with Friends and Family: Not on file  . Attends Religious Services: Not on file  . Active Member of Clubs or Organizations: Not on file  . Attends Archivist Meetings: Not on file  . Marital Status: Not on file  Intimate Partner Violence:   . Fear of Current or Ex-Partner: Not on file  . Emotionally Abused: Not on file  . Physically Abused: Not on file  . Sexually Abused: Not on file    Family History:    Family History  Problem Relation Age of Onset  . Emphysema Mother   . Liver disease Mother   . Lung cancer Father   . Hypertension Sister   . Hypertension Brother      ROS:  Please see the history of present illness.  Review of Systems  Constitutional: Positive for diaphoresis. Negative for malaise/fatigue.       After regaining consciousness. Briefly noted and now resolved.  Respiratory: Positive for shortness of breath.   Cardiovascular: Positive for chest pain and palpitations. Negative for orthopnea, leg swelling and PND.  Gastrointestinal: Positive  for nausea. Negative for vomiting.  Neurological: Positive for dizziness and loss of consciousness.  All other systems reviewed and are negative.   All other ROS reviewed and negative.     Physical Exam/Data:   Vitals:   09/09/19 0145 09/09/19 0230 09/09/19 0330 09/09/19 0421  BP:      Pulse: 76 67 68   Resp: 16 14 16    Temp:      TempSrc:      SpO2: 93% 94% 93%   Weight:    59 kg  Height:       No intake or output data in the 24 hours ending 09/09/19 0816 Last 3 Weights 09/09/2019 09/08/2019 06/29/2013  Weight (lbs) 130 lb 1.1 oz 145 lb 140 lb  Weight (kg) 59 kg 65.772 kg 63.504 kg     Body mass index is 23.04 kg/m.  General:  Well nourished, well developed, in no acute distress. HEENT: normal Neck: no JVD Vascular: systolic ejection murmur radiating into bilateral carotids; radial pulses 2+ bilaterally Cardiac:  normal S1, S2; RRR; 6-1/6 harsh systolic ejection murmur best appreciated in RUSB and radiating to LUSB and throughout the entire cardiac exam with further radiation up into the carotids Lungs:  clear to auscultation bilaterally, no wheezing, rhonchi or rales  Abd: soft, nontender, no hepatomegaly  Ext: no edema Musculoskeletal:  No deformities, BUE and BLE strength normal and equal Skin: warm and dry  Neuro:  No focal abnormalities noted Psych:  Normal affect   EKG:  The EKG was personally reviewed and demonstrates:  NSR, 92bpm, LVH. Subsequent EKGs NSR with ST elevation in the anterior leads likely 2/2 repolarization v ischemia.  Telemetry:  Telemetry was personally reviewed and demonstrates:  NSR, PACs, PVCs  Relevant CV Studies: Pending echo  Laboratory Data:  High Sensitivity Troponin:   Recent Labs  Lab 09/08/19 1755 09/09/19 0018 09/09/19 0310  TROPONINIHS 48* 132* 165*     Cardiac EnzymesNo results for input(s): TROPONINI in the last 168 hours. No results for input(s): TROPIPOC in the last 168 hours.  Chemistry Recent Labs  Lab 09/08/19 1755  NA 137  K 3.2*  CL 101  CO2 25  GLUCOSE 104*  BUN 12  CREATININE 0.86  CALCIUM 9.2  GFRNONAA >60  GFRAA >60  ANIONGAP 11    No results for input(s): PROT, ALBUMIN, AST, ALT, ALKPHOS, BILITOT in the last 168 hours. Hematology Recent Labs  Lab 09/08/19 1755  WBC 13.9*  RBC 4.76  HGB 14.9  HCT 43.7  MCV 91.8  MCH 31.3  MCHC 34.1  RDW 13.1  PLT 180   BNPNo results for input(s): BNP, PROBNP in the last 168 hours.  DDimer No results for input(s): DDIMER in the last 168 hours.   Radiology/Studies:  DG Chest 2 View  Result Date: 09/08/2019 CLINICAL DATA:  Syncope. EXAM: CHEST - 2 VIEW COMPARISON:  September 17, 2011 FINDINGS: Mild, diffuse chronic appearing increased lung markings are seen without evidence of acute infiltrate, pleural effusion or pneumothorax. The heart size and mediastinal contours are within normal limits. The visualized skeletal structures are unremarkable. IMPRESSION: No active cardiopulmonary disease. Electronically Signed   By: Virgina Norfolk M.D.   On: 09/08/2019 18:59   CT Head Wo Contrast  Result Date: 09/09/2019 CLINICAL DATA:  Altered mental status.  Dizziness and nausea. EXAM: CT HEAD WITHOUT CONTRAST TECHNIQUE: Contiguous axial images were obtained from the base of the skull through the vertex without intravenous contrast. COMPARISON:  None. FINDINGS: Brain: No intracranial hemorrhage, mass effect, or midline shift. No hydrocephalus. The basilar cisterns are patent. No evidence of territorial infarct or acute ischemia. No extra-axial or intracranial fluid collection. Vascular: No hyperdense vessel or unexpected calcification. Skull: No fracture or focal lesion. Sinuses/Orbits: Minimal opacification of a few left ethmoid air cells. The remaining paranasal sinuses are clear. Included orbits are unremarkable. Mastoid air cells are well aerated. Other: None. IMPRESSION: Negative noncontrast head CT. Electronically Signed   By: Keith Rake M.D.   On:  09/09/2019 00:11    Assessment and Plan:   Syncopal event, unwitnessed  --No current symptoms. Syncopal event 09/08/19 after cranking his chainsaw outside. Felt nausea, CP, dizziness, SOB/DOE. He went inside and was on the floor (due to nausea) when LOC for unknown amount of time but estimated as minutes. ED Head CT / CXR unremarkable. Labs significant for elevated HS Tn and hypokalemia.  --STAT echo recommended, due to harsh SEM appreciated on exam. Suspect etiology of syncope likely 2/2 aortic valve pathology, though also considered is acute cardiac ischemic event, arrhythmia, electrolyte abnormality, and/or vasovagal etiology. Continue to cycle HS Tn as below. Monitor on telemetry. Replete K+ with goal 4.0. --Further recommendations pending echo. If valvular dz has worsened, recommend consider R/LHC this admission to evaluate further for possible AVR/TAVR.  Harsh Systolic Ejection Murmur --No current CP/SOB. As above and 3-4/6, best appreciated RUSB.  Mummur first appreciated in 2011 and asx at that time. Echo not obtained due to being out of network. Suspect aortic valve dz that has worsened since that time given his syncopal event and sx as above. --Recommend stat echo. Pending echo, possible R/LHC to further evaluate his valvular disease and cors if indicated by echo findings at that time.   CP with elevated HS Tn --No current CP. Reports a central burning in his chest while cranking his chainsaw. He denies sx like this before in the past. CP has not recurred. --Etiology of CP unknown but suspect at least in part due to worsening valvular pathology. Cannot rule out cardiac ischemic event at this time and will continue to cycle HS  Tn until peaked and down-trending. Does have risk factors for CAD, including smoker, male, and HTN.  --Will obtain echo to further evaluate for valvular dz and assess his EF / rule out any acute structural abnormalities. Will order labs for further risk factor  stratification.  --Continue IV heparin and NPO status at this time with further recommendations regarding further ischemic evaluation if indicated pending echo. Continue current medications.  Hypokalemia --Replete with goal 4.0. Check Mg with goal 2.0.  Tobacco use --Cessation recommended.   HTN --Continue current medications.   Hemoglobin in UA --Defer to IM.   For questions or updates, please contact [place] Please consult [email or web address] for contact info under     Signed, Arvil Chaco, PA-C  09/09/2019 8:16 AM

## 2019-09-09 NOTE — ED Notes (Signed)
Pt is requesting breakfast - messaged admitting provider to see if pt can have dietary order

## 2019-09-09 NOTE — Progress Notes (Signed)
*  PRELIMINARY RESULTS* Echocardiogram 2D Echocardiogram has been performed.  Sherrie Sport 09/09/2019, 11:18 AM

## 2019-09-09 NOTE — Progress Notes (Signed)
ANTICOAGULATION CONSULT NOTE - Initial Consult  Pharmacy Consult for Heparin Indication: chest pain/ACS  Allergies  Allergen Reactions  . Benadryl [Diphenhydramine]     Racing heart, syncope    Patient Measurements: Height: 5\' 3"  (160 cm) Weight: 59 kg (130 lb 1.1 oz) IBW/kg (Calculated) : 56.9 HEPARIN DW (KG): 65.8  Vital Signs: BP: 120/72 (08/19 0822) Pulse Rate: 78 (08/19 0822)  Labs: Recent Labs    09/08/19 1755 09/09/19 0018 09/09/19 0120 09/09/19 0310  HGB 14.9  --   --   --   HCT 43.7  --   --   --   PLT 180  --   --   --   APTT  --   --  31  --   CREATININE 0.86  --   --   --   CKTOTAL  --  100  --   --   TROPONINIHS 48* 132*  --  165*    Estimated Creatinine Clearance: 75.4 mL/min (by C-G formula based on SCr of 0.86 mg/dL).   Medical History: Past Medical History:  Diagnosis Date  . Hypertension     Medications/Assessment: Heparin initiated for ACS.  No anticoagulants per PTA med list.  Baseline labs ordered.  Hepain bolus 4000 units x 1 then infusion at 800 units/hr Check HL ~ 6 hours after heparin started  Goal of Therapy:  Heparin level 0.3-0.7 units/ml Monitor platelets by anticoagulation protocol: Yes   Plan:  0819 1350 HL < 0.10 Will give 2000 unit bolus and increase rate to 1050 units/hr  Will recheck HL in 6 hours per protocol  Lu Duffel, PharmD, BCPS Clinical Pharmacist 09/09/2019 2:50 PM

## 2019-09-09 NOTE — Consult Note (Signed)
Cardiology Consultation:   Patient ID: Dean Obrien MRN: [id number]; DOB: 1960/11/08  Admit date: 09/09/2019 Date of Consult: 09/09/2019  Primary Care Provider: Patient, No Pcp Per Primary Cardiologist: [facility], Dr. Rockey Situ Primary Electrophysiologist:  None    Patient Profile:   Dean Obrien is a 59 y.o. male with a hx of HTN, tobacco use (at least 30 years), murmur, and who is being seen today for the evaluation of chest pain with syncopal episode at the request of Dr. Damita Dunnings.  History of Present Illness:   Dean Obrien is a 59 yo male with PMH as above.  He denies any family history of early cardiac death.  He reports a strong family history of hypertension.   He was previously evaluated by [facility] cardiology/Dr. Rockey Situ 11/17/2009 after referral from Dr. Oran Rein for cardiac murmur.  During his clinic visit, a murmur was noted on exam and classified as 4-0/9 systolic ejection murmur in the right upper sternal border and radiating to left sternal border and carotids.  It was thought that this murmur most likely represented aortic valve pathology; however, exam is unable to exclude pulmonary or tricuspid valve disease.  Echo was recommended for further evaluation.    It was noted he was out of network with [facility].  Our clinic attempted to thus set him up with an echo with an alternate group in [place] under [facility], called [facility].  Documentation states he requested to call [facility] back when ready to have this echo scheduled.  He wanted to discuss obtaining an echo with Dr. Oran Rein before proceeding forward.  On review of EMR, it appears as if the echo was never obtained.   At the time of his evaluation in 2011, he denied recent or past symptoms of valvular disease, including SOB/DOE and CP.  He was working on HTN / blood pressure control; however, he has been out of medications for some time.  Clinic BP 161/95 with home SBP 130-140s. He was maintained on  lisinopril 10mg , though it was noted that he would likely need increased dosing and recommended that he monitor his blood pressure at home for now.  Today, 09/09/2019, he presents to [facility] after an episode of chest pain, dyspnea, dizziness, and subsequent syncopal event.  Yesterday, 8/18, he was outside in his yard and doing yard work for some time. He remembers it was warm but otherwise felt fine. He was using his chain saw and reports he was vigorously cranking the chainsaw to start it, which he now believes triggered the subsequent symptoms / event. After several cranks of the chainsaw, he suddenly became extremely dizzy with acute SOB/dyspnea and nauseousness. He reports brief CP that felt like a burning or tightness in the center of his chest. This lasted approximately 10 seconds before he stopped using the chainsaw and came inside.    While inside, he was very short of breath and nauseated but denies further chest pain. Due to the nausea, he remembers coming down to the ground and being on his hands and knees before he lost consciousness. He does not feel that he hit his head, given he was already down on the floor.  The syncopal event was unwitnessed; however, he states his family was in a nearby room.  They peaked in on him soon after the event, he feels, and they noticed that he was unconscious.  His daughter tells him she estimates that he was out for 1 to 2 minutes at the most, based on their knowing the  time he came in from outside.   When he regained consciousness, his family was speaking with EMS.  He felt short of breath/nausea and also noted diaphoresis after regaining consciousness but denies any further CP, dizziness, or nausea and has not had any recurrent symptoms since that time.  He has not had an episode like this in the past. He reports a h/o rare palpitations with rare PACs and PVCs noted on telemetry. He denies any h/o pnd, orthopnea, edema, weight gain, or early satiety.     In the  ED, vitals stable. EKG without acute ST/T changes. Labs showed elevated HS Tn 48  132  165, Cr 0.86, K 3.2, WBC 13.9. UA showed moderate Hgb without bacteria or WBC seen. Head CT and CXR without acute finding. He was started on IV heparin and cardiology consulted.    No current chest pain, dizziness, DOE/SOB, nausea, or diaphoresis at the time of my exam.    Heart Pathway Score:     Past Medical History:  Diagnosis Date   Hypertension     History reviewed. No pertinent surgical history.   Home Medications:  Prior to Admission medications   Medication Sig Start Date End Date Taking? Authorizing Provider  lisinopril (PRINIVIL,ZESTRIL) 10 MG tablet Take 10 mg by mouth daily.   Patient not taking: Reported on 09/09/2019    [provider]  loratadine (CLARITIN) 10 MG tablet Take 1 tablet (10 mg total) by mouth daily. Patient not taking: Reported on 09/09/2019 08/23/14   Steele Sizer, MD  terbinafine (LAMISIL) 250 MG tablet Take 1 tablet (250 mg total) by mouth daily. Patient not taking: Reported on 09/09/2019 06/29/13   Wallene Huh, DPM    Inpatient Medications: Scheduled Meds:  [START ON 09/10/2019] aspirin EC  81 mg Oral Daily   atorvastatin  40 mg Oral Daily   metoprolol tartrate  12.5 mg Oral BID   nicotine  21 mg Transdermal Daily   Continuous Infusions:  heparin 800 Units/hr (09/09/19 0424)   PRN Meds: acetaminophen, nitroGLYCERIN, ondansetron (ZOFRAN) IV  Allergies:    Allergies  Allergen Reactions   Benadryl [Diphenhydramine]     Racing heart, syncope    Social History:   Social History   Socioeconomic History   Marital status: Married    Spouse name: Not on file   Number of children: Not on file   Years of education: Not on file   Highest education level: Not on file  Occupational History   Occupation: Social research officer, government (desk job)    Comment: Retired   Occupation: Public house manager in the [employer]  Tobacco Use   Smoking status: Current Every  Day Smoker    Packs/day: 1.50    Years: 35.00    Pack years: 52.50  Substance and Sexual Activity   Alcohol use: No   Drug use: No   Sexual activity: Not on file  Other Topics Concern   Not on file  Social History Narrative   Pt gets regular exercise.   Social Determinants of Health   Financial Resource Strain:    Difficulty of Paying Living Expenses: Not on file  Food Insecurity:    Worried About Charity fundraiser in the Last Year: Not on file   [employer] of Food in the Last Year: Not on file  Transportation Needs:    Lack of Transportation (Medical): Not on file   Lack of Transportation (Non-Medical): Not on file  Physical Activity:    Days of  Exercise per Week: Not on file   Minutes of Exercise per Session: Not on file  Stress:    Feeling of Stress : Not on file  Social Connections:    Frequency of Communication with Friends and Family: Not on file   Frequency of Social Gatherings with Friends and Family: Not on file   Attends Religious Services: Not on file   Active Member of Clubs or Organizations: Not on file   Attends Archivist Meetings: Not on file   Marital Status: Not on file  Intimate Partner Violence:    Fear of Current or Ex-Partner: Not on file   Emotionally Abused: Not on file   Physically Abused: Not on file   Sexually Abused: Not on file    Family History:    Family History  Problem Relation Age of Onset   Emphysema Mother    Liver disease Mother    Lung cancer Father    Hypertension Sister    Hypertension Brother      ROS:  Please see the history of present illness.  Review of Systems  Constitutional: Positive for diaphoresis. Negative for malaise/fatigue.       After regaining consciousness. Briefly noted and now resolved.  Respiratory: Positive for shortness of breath.   Cardiovascular: Positive for chest pain and palpitations. Negative for orthopnea, leg swelling and PND.  Gastrointestinal: Positive  for nausea. Negative for vomiting.  Neurological: Positive for dizziness and loss of consciousness.  All other systems reviewed and are negative.   All other ROS reviewed and negative.     Physical Exam/Data:   Vitals:   09/09/19 0145 09/09/19 0230 09/09/19 0330 09/09/19 0421  BP:      Pulse: 76 67 68   Resp: 16 14 16    Temp:      TempSrc:      SpO2: 93% 94% 93%   Weight:    59 kg  Height:       No intake or output data in the 24 hours ending 09/09/19 0816 Last 3 Weights 09/09/2019 09/08/2019 06/29/2013  Weight (lbs) 130 lb 1.1 oz 145 lb 140 lb  Weight (kg) 59 kg 65.772 kg 63.504 kg     Body mass index is 23.04 kg/m.  General:  Well nourished, well developed, in no acute distress. HEENT: normal Neck: no JVD Vascular: systolic ejection murmur radiating into bilateral carotids; radial pulses 2+ bilaterally Cardiac:  normal S1, S2; RRR; 3-8/7 harsh systolic ejection murmur best appreciated in RUSB and radiating to LUSB and throughout the entire cardiac exam with further radiation up into the carotids Lungs:  clear to auscultation bilaterally, no wheezing, rhonchi or rales  Abd: soft, nontender, no hepatomegaly  Ext: no edema Musculoskeletal:  No deformities, BUE and BLE strength normal and equal Skin: warm and dry  Neuro:  No focal abnormalities noted Psych:  Normal affect   EKG:  The EKG was personally reviewed and demonstrates:  NSR, 92bpm, LVH. Subsequent EKGs NSR with ST elevation in the anterior leads likely 2/2 repolarization v ischemia.  Telemetry:  Telemetry was personally reviewed and demonstrates:  NSR, PACs, PVCs  Relevant CV Studies: Pending echo  Laboratory Data:  High Sensitivity Troponin:   Recent Labs  Lab 09/08/19 1755 09/09/19 0018 09/09/19 0310  TROPONINIHS 48* 132* 165*     Cardiac EnzymesNo results for input(s): TROPONINI in the last 168 hours. No results for input(s): TROPIPOC in the last 168 hours.  Chemistry Recent Labs  Lab 09/08/19 1755  NA 137  K 3.2*  CL 101  CO2 25  GLUCOSE 104*  BUN 12  CREATININE 0.86  CALCIUM 9.2  GFRNONAA >60  GFRAA >60  ANIONGAP 11    No results for input(s): PROT, ALBUMIN, AST, ALT, ALKPHOS, BILITOT in the last 168 hours. Hematology Recent Labs  Lab 09/08/19 1755  WBC 13.9*  RBC 4.76  HGB 14.9  HCT 43.7  MCV 91.8  MCH 31.3  MCHC 34.1  RDW 13.1  PLT 180   BNPNo results for input(s): BNP, PROBNP in the last 168 hours.  DDimer No results for input(s): DDIMER in the last 168 hours.   Radiology/Studies:  DG Chest 2 View  Result Date: 09/08/2019 CLINICAL DATA:  Syncope. EXAM: CHEST - 2 VIEW COMPARISON:  September 17, 2011 FINDINGS: Mild, diffuse chronic appearing increased lung markings are seen without evidence of acute infiltrate, pleural effusion or pneumothorax. The heart size and mediastinal contours are within normal limits. The visualized skeletal structures are unremarkable. IMPRESSION: No active cardiopulmonary disease. Electronically Signed   By: Virgina Norfolk M.D.   On: 09/08/2019 18:59   CT Head Wo Contrast  Result Date: 09/09/2019 CLINICAL DATA:  Altered mental status.  Dizziness and nausea. EXAM: CT HEAD WITHOUT CONTRAST TECHNIQUE: Contiguous axial images were obtained from the base of the skull through the vertex without intravenous contrast. COMPARISON:  None. FINDINGS: Brain: No intracranial hemorrhage, mass effect, or midline shift. No hydrocephalus. The basilar cisterns are patent. No evidence of territorial infarct or acute ischemia. No extra-axial or intracranial fluid collection. Vascular: No hyperdense vessel or unexpected calcification. Skull: No fracture or focal lesion. Sinuses/Orbits: Minimal opacification of a few left ethmoid air cells. The remaining paranasal sinuses are clear. Included orbits are unremarkable. Mastoid air cells are well aerated. Other: None. IMPRESSION: Negative noncontrast head CT. Electronically Signed   By: Keith Rake M.D.   On:  09/09/2019 00:11    Assessment and Plan:   Syncopal event, unwitnessed  --No current symptoms. Syncopal event 09/08/19 after cranking his chainsaw outside. Felt nausea, CP, dizziness, SOB/DOE. He went inside and was on the floor (due to nausea) when LOC for unknown amount of time but estimated as minutes. ED Head CT / CXR unremarkable. Labs significant for elevated HS Tn and hypokalemia.  --STAT echo recommended, due to harsh SEM appreciated on exam. Suspect etiology of syncope likely 2/2 aortic valve pathology, though also considered is acute cardiac ischemic event, arrhythmia, electrolyte abnormality, and/or vasovagal etiology. Continue to cycle HS Tn as below. Monitor on telemetry. Replete K+ with goal 4.0. --Further recommendations pending echo. If valvular dz has worsened, recommend consider R/LHC this admission to evaluate further for possible AVR/TAVR.  Harsh Systolic Ejection Murmur --No current CP/SOB. As above and 3-4/6, best appreciated RUSB.  Mummur first appreciated in 2011 and asx at that time. Echo not obtained due to being out of network. Suspect aortic valve dz that has worsened since that time given his syncopal event and sx as above. --Recommend stat echo. Pending echo, possible R/LHC to further evaluate his valvular disease and cors if indicated by echo findings at that time.   CP with elevated HS Tn --No current CP. Reports a central burning in his chest while cranking his chainsaw. He denies sx like this before in the past. CP has not recurred. --Etiology of CP unknown but suspect at least in part due to worsening valvular pathology. Cannot rule out cardiac ischemic event at this time and will continue to cycle HS  Tn until peaked and down-trending. Does have risk factors for CAD, including smoker, male, and HTN.  --Will obtain echo to further evaluate for valvular dz and assess his EF / rule out any acute structural abnormalities. Will order labs for further risk factor  stratification.  --Continue IV heparin and NPO status at this time with further recommendations regarding further ischemic evaluation if indicated pending echo. Continue current medications.  Hypokalemia --Replete with goal 4.0. Check Mg with goal 2.0.  Tobacco use --Cessation recommended.   HTN --Continue current medications.   Hemoglobin in UA --Defer to IM.   For questions or updates, please contact [place] Please consult [email or web address] for contact info under     Signed, Arvil Chaco, PA-C  09/09/2019 8:16 AM

## 2019-09-09 NOTE — ED Notes (Signed)
Pt given meal tray from dietary

## 2019-09-10 ENCOUNTER — Other Ambulatory Visit: Payer: Self-pay

## 2019-09-10 ENCOUNTER — Encounter: Payer: Self-pay | Admitting: Internal Medicine

## 2019-09-10 ENCOUNTER — Encounter: Payer: Self-pay | Admitting: Cardiology

## 2019-09-10 ENCOUNTER — Encounter
Admission: EM | Disposition: A | Discharge status: Home / Self Care | Payer: Self-pay | Source: Home / Self Care | Attending: Emergency Medicine

## 2019-09-10 ENCOUNTER — Inpatient Hospital Stay (HOSPITAL_COMMUNITY)
Admission: AD | Admit: 2019-09-10 | Discharge: 2019-09-19 | DRG: 266 | Disposition: A | Discharge status: Home / Self Care | Source: Other Acute Inpatient Hospital | Attending: Cardiothoracic Surgery | Admitting: Cardiothoracic Surgery

## 2019-09-10 DIAGNOSIS — R110 Nausea: Secondary | ICD-10-CM | POA: Diagnosis not present

## 2019-09-10 DIAGNOSIS — Z79899 Other long term (current) drug therapy: Secondary | ICD-10-CM

## 2019-09-10 DIAGNOSIS — Z8249 Family history of ischemic heart disease and other diseases of the circulatory system: Secondary | ICD-10-CM

## 2019-09-10 DIAGNOSIS — E8770 Fluid overload, unspecified: Secondary | ICD-10-CM | POA: Diagnosis not present

## 2019-09-10 DIAGNOSIS — I4891 Unspecified atrial fibrillation: Secondary | ICD-10-CM | POA: Diagnosis not present

## 2019-09-10 DIAGNOSIS — Z888 Allergy status to other drugs, medicaments and biological substances status: Secondary | ICD-10-CM | POA: Diagnosis not present

## 2019-09-10 DIAGNOSIS — Z953 Presence of xenogenic heart valve: Secondary | ICD-10-CM

## 2019-09-10 DIAGNOSIS — R011 Cardiac murmur, unspecified: Secondary | ICD-10-CM | POA: Diagnosis present

## 2019-09-10 DIAGNOSIS — I350 Nonrheumatic aortic (valve) stenosis: Secondary | ICD-10-CM | POA: Diagnosis not present

## 2019-09-10 DIAGNOSIS — F17210 Nicotine dependence, cigarettes, uncomplicated: Secondary | ICD-10-CM | POA: Diagnosis present

## 2019-09-10 DIAGNOSIS — Z720 Tobacco use: Secondary | ICD-10-CM

## 2019-09-10 DIAGNOSIS — R911 Solitary pulmonary nodule: Secondary | ICD-10-CM | POA: Diagnosis present

## 2019-09-10 DIAGNOSIS — Z951 Presence of aortocoronary bypass graft: Secondary | ICD-10-CM

## 2019-09-10 DIAGNOSIS — I2510 Atherosclerotic heart disease of native coronary artery without angina pectoris: Secondary | ICD-10-CM | POA: Diagnosis present

## 2019-09-10 DIAGNOSIS — E785 Hyperlipidemia, unspecified: Secondary | ICD-10-CM

## 2019-09-10 DIAGNOSIS — I10 Essential (primary) hypertension: Secondary | ICD-10-CM | POA: Diagnosis present

## 2019-09-10 DIAGNOSIS — Z01810 Encounter for preprocedural cardiovascular examination: Secondary | ICD-10-CM | POA: Diagnosis not present

## 2019-09-10 DIAGNOSIS — I214 Non-ST elevation (NSTEMI) myocardial infarction: Secondary | ICD-10-CM | POA: Diagnosis present

## 2019-09-10 DIAGNOSIS — Z825 Family history of asthma and other chronic lower respiratory diseases: Secondary | ICD-10-CM

## 2019-09-10 DIAGNOSIS — Z9889 Other specified postprocedural states: Secondary | ICD-10-CM

## 2019-09-10 DIAGNOSIS — Z7982 Long term (current) use of aspirin: Secondary | ICD-10-CM | POA: Diagnosis not present

## 2019-09-10 DIAGNOSIS — J939 Pneumothorax, unspecified: Secondary | ICD-10-CM

## 2019-09-10 DIAGNOSIS — R55 Syncope and collapse: Secondary | ICD-10-CM | POA: Diagnosis present

## 2019-09-10 DIAGNOSIS — E876 Hypokalemia: Secondary | ICD-10-CM | POA: Diagnosis present

## 2019-09-10 DIAGNOSIS — I083 Combined rheumatic disorders of mitral, aortic and tricuspid valves: Secondary | ICD-10-CM | POA: Diagnosis present

## 2019-09-10 DIAGNOSIS — R7309 Other abnormal glucose: Secondary | ICD-10-CM | POA: Insufficient documentation

## 2019-09-10 DIAGNOSIS — R7303 Prediabetes: Secondary | ICD-10-CM | POA: Insufficient documentation

## 2019-09-10 DIAGNOSIS — F17200 Nicotine dependence, unspecified, uncomplicated: Secondary | ICD-10-CM | POA: Diagnosis present

## 2019-09-10 DIAGNOSIS — E119 Type 2 diabetes mellitus without complications: Secondary | ICD-10-CM | POA: Diagnosis present

## 2019-09-10 DIAGNOSIS — Z87898 Personal history of other specified conditions: Secondary | ICD-10-CM

## 2019-09-10 DIAGNOSIS — D62 Acute posthemorrhagic anemia: Secondary | ICD-10-CM | POA: Diagnosis not present

## 2019-09-10 DIAGNOSIS — J90 Pleural effusion, not elsewhere classified: Secondary | ICD-10-CM

## 2019-09-10 DIAGNOSIS — Z006 Encounter for examination for normal comparison and control in clinical research program: Secondary | ICD-10-CM

## 2019-09-10 HISTORY — PX: RIGHT/LEFT HEART CATH AND CORONARY ANGIOGRAPHY: CATH118266

## 2019-09-10 HISTORY — DX: Hyperlipidemia, unspecified: E78.5

## 2019-09-10 HISTORY — DX: Other ill-defined heart diseases: I51.89

## 2019-09-10 HISTORY — DX: Prediabetes: R73.03

## 2019-09-10 HISTORY — DX: Atherosclerotic heart disease of native coronary artery without angina pectoris: I25.10

## 2019-09-10 LAB — HEPARIN LEVEL (UNFRACTIONATED): Heparin Unfractionated: 0.5 IU/mL (ref 0.30–0.70)

## 2019-09-10 LAB — GLUCOSE, CAPILLARY: Glucose-Capillary: 83 mg/dL (ref 70–99)

## 2019-09-10 LAB — APTT: aPTT: 50 seconds — ABNORMAL HIGH (ref 24–36)

## 2019-09-10 SURGERY — RIGHT/LEFT HEART CATH AND CORONARY ANGIOGRAPHY
Anesthesia: Moderate Sedation

## 2019-09-10 MED ORDER — NITROGLYCERIN 0.4 MG SL SUBL: 0.4000 mg | SUBLINGUAL_TABLET | SUBLINGUAL | 12 refills | Status: DC | PRN

## 2019-09-10 MED ORDER — SODIUM CHLORIDE 0.9 % IV SOLN: 250.0000 mL | INTRAVENOUS | Status: DC | PRN

## 2019-09-10 MED ORDER — SODIUM CHLORIDE 0.9% FLUSH: 3.0000 mL | Freq: Two times a day (BID) | INTRAVENOUS | Status: DC

## 2019-09-10 MED ORDER — ATORVASTATIN CALCIUM 40 MG PO TABS
40.0000 mg | ORAL_TABLET | Freq: Every day | ORAL | 0 refills | Status: DC
Start: 2019-09-10 — End: 2019-10-01

## 2019-09-10 MED ORDER — HEPARIN SODIUM (PORCINE) 1000 UNIT/ML IJ SOLN
INTRAMUSCULAR | Status: AC
  Filled 2019-09-10: qty 1

## 2019-09-10 MED ORDER — MAGNESIUM SULFATE 2 GM/50ML IV SOLN
2.0000 g | Freq: Once | INTRAVENOUS | Status: AC
  Administered 2019-09-10: 2 g via INTRAVENOUS
  Filled 2019-09-10: qty 50

## 2019-09-10 MED ORDER — ACETAMINOPHEN 325 MG PO TABS: 650.0000 mg | ORAL_TABLET | ORAL | Status: DC | PRN

## 2019-09-10 MED ORDER — VERAPAMIL HCL 2.5 MG/ML IV SOLN
INTRAVENOUS | Status: AC
  Filled 2019-09-10: qty 2

## 2019-09-10 MED ORDER — ASPIRIN EC 81 MG PO TBEC
81.0000 mg | DELAYED_RELEASE_TABLET | Freq: Every day | ORAL | Status: DC
  Administered 2019-09-11 – 2019-09-13 (×3): 81 mg via ORAL
  Filled 2019-09-10 (×3): qty 1

## 2019-09-10 MED ORDER — MIDAZOLAM HCL 2 MG/2ML IJ SOLN
INTRAMUSCULAR | Status: AC
  Filled 2019-09-10: qty 2

## 2019-09-10 MED ORDER — METOPROLOL TARTRATE 25 MG PO TABS
12.5000 mg | ORAL_TABLET | Freq: Two times a day (BID) | ORAL | 0 refills | Status: DC
Start: 2019-09-10 — End: 2019-09-19

## 2019-09-10 MED ORDER — LIDOCAINE HCL (PF) 1 % IJ SOLN
INTRAMUSCULAR | Status: DC | PRN
  Administered 2019-09-10: 2 mL

## 2019-09-10 MED ORDER — HEPARIN (PORCINE) 25000 UT/250ML-% IV SOLN
1300.0000 [IU]/h | INTRAVENOUS | Status: DC
  Administered 2019-09-11 – 2019-09-13 (×3): 1300 [IU]/h via INTRAVENOUS
  Filled 2019-09-10 (×4): qty 250

## 2019-09-10 MED ORDER — HEPARIN SODIUM (PORCINE) 1000 UNIT/ML IJ SOLN
INTRAMUSCULAR | Status: DC | PRN
  Administered 2019-09-10: 3500 [IU] via INTRAVENOUS

## 2019-09-10 MED ORDER — ASPIRIN 81 MG PO TBEC: 81.0000 mg | DELAYED_RELEASE_TABLET | Freq: Every day | ORAL | 11 refills | Status: AC

## 2019-09-10 MED ORDER — MIDAZOLAM HCL 2 MG/2ML IJ SOLN
INTRAMUSCULAR | Status: DC | PRN
  Administered 2019-09-10: 1 mg via INTRAVENOUS

## 2019-09-10 MED ORDER — SODIUM CHLORIDE 0.9 % IV SOLN: INTRAVENOUS | Status: AC

## 2019-09-10 MED ORDER — NITROGLYCERIN 0.4 MG SL SUBL: 0.4000 mg | SUBLINGUAL_TABLET | SUBLINGUAL | Status: DC | PRN

## 2019-09-10 MED ORDER — VERAPAMIL HCL 2.5 MG/ML IV SOLN
INTRAVENOUS | Status: DC | PRN
  Administered 2019-09-10: 2.5 mg via INTRAVENOUS

## 2019-09-10 MED ORDER — HEPARIN (PORCINE) 25000 UT/250ML-% IV SOLN
1200.0000 [IU]/h | INTRAVENOUS | Status: DC
  Administered 2019-09-10: 1200 [IU]/h via INTRAVENOUS
  Filled 2019-09-10: qty 250

## 2019-09-10 MED ORDER — NICOTINE 21 MG/24HR TD PT24: 21.0000 mg | MEDICATED_PATCH | Freq: Every day | TRANSDERMAL | 0 refills | Status: DC

## 2019-09-10 MED ORDER — HEPARIN (PORCINE) IN NACL 1000-0.9 UT/500ML-% IV SOLN
INTRAVENOUS | Status: AC
  Filled 2019-09-10: qty 1000

## 2019-09-10 MED ORDER — FENTANYL CITRATE (PF) 100 MCG/2ML IJ SOLN
INTRAMUSCULAR | Status: AC
  Filled 2019-09-10: qty 2

## 2019-09-10 MED ORDER — IOHEXOL 300 MG/ML  SOLN
INTRAMUSCULAR | Status: DC | PRN
  Administered 2019-09-10: 50 mL

## 2019-09-10 MED ORDER — HEPARIN (PORCINE) IN NACL 1000-0.9 UT/500ML-% IV SOLN
INTRAVENOUS | Status: DC | PRN
  Administered 2019-09-10: 500 mL

## 2019-09-10 MED ORDER — ATORVASTATIN CALCIUM 40 MG PO TABS
40.0000 mg | ORAL_TABLET | Freq: Every day | ORAL | Status: DC
  Administered 2019-09-11 – 2019-09-19 (×8): 40 mg via ORAL
  Filled 2019-09-10 (×8): qty 1

## 2019-09-10 MED ORDER — SODIUM CHLORIDE 0.9% FLUSH: 3.0000 mL | INTRAVENOUS | Status: DC | PRN

## 2019-09-10 MED ORDER — LIDOCAINE HCL (PF) 1 % IJ SOLN
INTRAMUSCULAR | Status: AC
  Filled 2019-09-10: qty 30

## 2019-09-10 MED ORDER — FENTANYL CITRATE (PF) 100 MCG/2ML IJ SOLN
INTRAMUSCULAR | Status: DC | PRN
  Administered 2019-09-10: 25 ug via INTRAVENOUS

## 2019-09-10 MED ORDER — METOPROLOL TARTRATE 12.5 MG HALF TABLET
12.5000 mg | ORAL_TABLET | Freq: Two times a day (BID) | ORAL | Status: DC
  Administered 2019-09-11 – 2019-09-14 (×7): 12.5 mg via ORAL
  Filled 2019-09-10 (×6): qty 1

## 2019-09-10 MED ORDER — NICOTINE 21 MG/24HR TD PT24
21.0000 mg | MEDICATED_PATCH | Freq: Every day | TRANSDERMAL | Status: DC
  Administered 2019-09-11 – 2019-09-19 (×8): 21 mg via TRANSDERMAL
  Filled 2019-09-10 (×8): qty 1

## 2019-09-10 MED ORDER — HEPARIN (PORCINE) 25000 UT/250ML-% IV SOLN: 1200.0000 [IU]/h | INTRAVENOUS | Status: DC

## 2019-09-10 MED ORDER — ONDANSETRON HCL 4 MG/2ML IJ SOLN: 4.0000 mg | Freq: Four times a day (QID) | INTRAMUSCULAR | Status: DC | PRN

## 2019-09-10 SURGICAL SUPPLY — 10 items
CATH INFINITI 5FR JK (CATHETERS) ×2 IMPLANT
CATH INFINITI JR4 5F (CATHETERS) ×2 IMPLANT
CATH SWANZ 7F THERMO (CATHETERS) ×2 IMPLANT
DEVICE RAD TR BAND REGULAR (VASCULAR PRODUCTS) ×2 IMPLANT
GLIDESHEATH SLEND SS 6F .021 (SHEATH) ×2 IMPLANT
GLIDESHEATH SLENDER 7FR .021G (SHEATH) ×2 IMPLANT
GUIDEWIRE INQWIRE 1.5J.035X260 (WIRE) IMPLANT
INQWIRE 1.5J .035X260CM (WIRE) ×3
KIT MANI 3VAL PERCEP (MISCELLANEOUS) ×3 IMPLANT
PACK CARDIAC CATH (CUSTOM PROCEDURE TRAY) ×3 IMPLANT

## 2019-09-10 NOTE — Progress Notes (Signed)
Interval history  Patient seen and examined Arrived for single vessel CABG and AVR  Briefly,  Mr Gladden is a 59 y/o male with PMH HTN, HLD, pre DM, tobacco use noted to have critical/very severe AS (AVA 0.42cm^2, Vmax 5.5 m/sMG 33mm Hg) with L/RHC today showing normal CO, normal PA#, wedge, LHC showed pRCA 90%s, pLCx 20%s, mLAD 50%s.  He is hemodynamically stable, asymptomatic now H&P as noted today We will resume his medications- aspirin, statin, metoprolol and IV heparin (given NSTEMI) Will tentatively keep him NPO after midnight. Will confirm surgical plans with the team tomorrow am (tomorrow vs on Monday or Tuesday).  Renae Fickle, MD Cardiology fellow

## 2019-09-10 NOTE — Progress Notes (Signed)
Progress Note  Patient Name: Dean Obrien Date of Encounter: 09/10/2019  [facility] Cardiologist: NEW ([place])  Subjective   No chest pain, shortness of breath or palpitations.  Inpatient Medications    Scheduled Meds: . [MAR Hold] aspirin EC  81 mg Oral Daily  . [MAR Hold] atorvastatin  40 mg Oral Daily  . [MAR Hold] metoprolol tartrate  12.5 mg Oral BID  . [MAR Hold] nicotine  21 mg Transdermal Daily  . [MAR Hold] sodium chloride flush  3 mL Intravenous Q12H   Continuous Infusions: . sodium chloride    . sodium chloride 10 mL/hr (09/10/19 8315)  . heparin     PRN Meds: sodium chloride, [MAR Hold] acetaminophen, [MAR Hold] nitroGLYCERIN, [MAR Hold] ondansetron (ZOFRAN) IV, sodium chloride flush   Vital Signs    Vitals:   09/10/19 0945 09/10/19 1002 09/10/19 1015 09/10/19 1031  BP: 111/74 108/76 118/75 111/73  Pulse:  72 74 70  Resp: 16 18 20 20   Temp:      TempSrc:      SpO2: 94% 94% 95% 96%  Weight:      Height:        Intake/Output Summary (Last 24 hours) at 09/10/2019 1048 Last data filed at 09/10/2019 0751 Gross per 24 hour  Intake 300.78 ml  Output --  Net 300.78 ml   Last 3 Weights 09/10/2019 09/09/2019 09/08/2019  Weight (lbs) 143 lb 14.4 oz 130 lb 1.1 oz 145 lb  Weight (kg) 65.273 kg 59 kg 65.772 kg      Telemetry    Normal sinus rhythm with no significant arrhythmia.- Personally Reviewed  ECG    Not performed today- Personally Reviewed  Physical Exam   GEN: No acute distress.   Neck: No JVD Cardiac: RRR, no rubs, or gallops.  3 out of 6 crescendo decrescendo systolic murmur in the aortic area which is late peaking with very diminished S2. Respiratory: Clear to auscultation bilaterally. GI: Soft, nontender, non-distended  MS: No edema; No deformity. Neuro:  Nonfocal  Psych: Normal affect   Labs    High Sensitivity Troponin:   Recent Labs  Lab 09/08/19 1755 09/09/19 0018 09/09/19 0310  TROPONINIHS 48* 132* 165*       Chemistry Recent Labs  Lab 09/08/19 1755 09/09/19 0120 09/09/19 1323  NA 137  --  138  K 3.2*  --  3.8  CL 101  --  104  CO2 25  --  23  GLUCOSE 104*  --  117*  BUN 12  --  12  CREATININE 0.86  --  0.60*  CALCIUM 9.2  --  9.2  PROT  --  5.3*  --   ALBUMIN  --  3.0*  --   AST  --  16  --   ALT  --  15  --   ALKPHOS  --  37*  --   BILITOT  --  0.6  --   GFRNONAA >60  --  >60  GFRAA >60  --  >60  ANIONGAP 11  --  11     Hematology Recent Labs  Lab 09/08/19 1755 09/09/19 0120  WBC 13.9* 14.9*  RBC 4.76 5.18  HGB 14.9 16.2  HCT 43.7 47.2  MCV 91.8 91.1  MCH 31.3 31.3  MCHC 34.1 34.3  RDW 13.1 13.2  PLT 180 199    BNPNo results for input(s): BNP, PROBNP in the last 168 hours.   DDimer No results for input(s): DDIMER in the last  168 hours.   Radiology    DG Chest 2 View  Result Date: 09/08/2019 CLINICAL DATA:  Syncope. EXAM: CHEST - 2 VIEW COMPARISON:  September 17, 2011 FINDINGS: Mild, diffuse chronic appearing increased lung markings are seen without evidence of acute infiltrate, pleural effusion or pneumothorax. The heart size and mediastinal contours are within normal limits. The visualized skeletal structures are unremarkable. IMPRESSION: No active cardiopulmonary disease. Electronically Signed   By: Virgina Norfolk M.D.   On: 09/08/2019 18:59   CT Head Wo Contrast  Result Date: 09/09/2019 CLINICAL DATA:  Altered mental status.  Dizziness and nausea. EXAM: CT HEAD WITHOUT CONTRAST TECHNIQUE: Contiguous axial images were obtained from the base of the skull through the vertex without intravenous contrast. COMPARISON:  None. FINDINGS: Brain: No intracranial hemorrhage, mass effect, or midline shift. No hydrocephalus. The basilar cisterns are patent. No evidence of territorial infarct or acute ischemia. No extra-axial or intracranial fluid collection. Vascular: No hyperdense vessel or unexpected calcification. Skull: No fracture or focal lesion. Sinuses/Orbits:  Minimal opacification of a few left ethmoid air cells. The remaining paranasal sinuses are clear. Included orbits are unremarkable. Mastoid air cells are well aerated. Other: None. IMPRESSION: Negative noncontrast head CT. Electronically Signed   By: Keith Rake M.D.   On: 09/09/2019 00:11   CARDIAC CATHETERIZATION  Result Date: 09/10/2019  Prox Cx lesion is 20% stenosed.  Mid LAD lesion is 50% stenosed.  Prox RCA lesion is 90% stenosed.  1.  Significant one-vessel coronary artery disease with 90% proximal stenosis in the right coronary artery.  Moderate mid LAD stenosis.  Moderate to heavy calcifications are noted. 2.  Severely calcified aortic valve with restricted opening.  I did not attempt to cross the aortic valve. 3.  Right heart catheterization showed normal filling pressures, normal pulmonary pressure and normal cardiac output. Recommendations: The patient has critical aortic stenosis and one-vessel coronary artery disease with associated syncope.  Given the patient's relatively young age, I suspect that he has bicuspid aortic valve. Recommend transfer to [facility] for evaluation of AVR and one-vessel CABG. Resume heparin 8 hours after sheath pull.   ECHOCARDIOGRAM COMPLETE  Result Date: 09/09/2019    ECHOCARDIOGRAM REPORT   Patient Name:   Dean Obrien Date of Exam: 09/09/2019 Medical Rec #:  [id number]       Height:       63.0 in Accession #:    [id number]      Weight:       130.1 lb Date of Birth:  Jul 10, 1960        BSA:          1.611 m Patient Age:    59 years        BP:           120/72 mmHg Patient Gender: M               HR:           78 bpm. Exam Location:  [facility] Procedure: 2D Echo, Cardiac Doppler and Color Doppler Indications:     Syncope 780.2 severe aortic valve stenosis  History:         Patient has no prior history of Echocardiogram examinations.                  Risk Factors:Hypertension.  Sonographer:     Sherrie Sport RDCS (AE) Referring Phys:  [place] Diagnosing Phys:  Ida Rogue MD IMPRESSIONS  1. Left ventricular ejection fraction, by  estimation, is 55 to 60%. The left ventricle has normal function. The left ventricle has no regional wall motion abnormalities. There is moderate left ventricular hypertrophy. Left ventricular diastolic parameters are consistent with Grade I diastolic dysfunction (impaired relaxation).  2. Right ventricular systolic function is normal. The right ventricular size is normal. There is normal pulmonary artery systolic pressure. The estimated right ventricular systolic pressure is 19.4 mmHg.  3. Mild mitral valve regurgitation.  4. The aortic valve was not well visualized. Appears heavily calcified. Aortic valve regurgitation is not visualized. Critical aortic valve stenosis. Aortic valve area, by VTI measures 0.42 cm. Aortic valve mean gradient measures 84.7 mmHg. Aortic valve Vmax measures 5.51 m/s. FINDINGS  Left Ventricle: Left ventricular ejection fraction, by estimation, is 55 to 60%. The left ventricle has normal function. The left ventricle has no regional wall motion abnormalities. The left ventricular internal cavity size was normal in size. There is  moderate left ventricular hypertrophy. Left ventricular diastolic parameters are consistent with Grade I diastolic dysfunction (impaired relaxation). Right Ventricle: The right ventricular size is normal. No increase in right ventricular wall thickness. Right ventricular systolic function is normal. There is normal pulmonary artery systolic pressure. The tricuspid regurgitant velocity is 2.19 m/s, and  with an assumed right atrial pressure of 10 mmHg, the estimated right ventricular systolic pressure is 17.4 mmHg. Left Atrium: Left atrial size was normal in size. Right Atrium: Right atrial size was normal in size. Pericardium: There is no evidence of pericardial effusion. Mitral Valve: The mitral valve is normal in structure. Normal mobility of the mitral valve leaflets. Mild mitral valve  regurgitation. No evidence of mitral valve stenosis. Tricuspid Valve: The tricuspid valve is normal in structure. Tricuspid valve regurgitation is trivial. No evidence of tricuspid stenosis. Aortic Valve: The aortic valve was not well visualized. Aortic valve regurgitation is not visualized. Severe aortic stenosis is present. Aortic valve mean gradient measures 84.7 mmHg. Aortic valve peak gradient measures 121.6 mmHg. Aortic valve area, by VTI measures 0.42 cm. Pulmonic Valve: The pulmonic valve was normal in structure. Pulmonic valve regurgitation is not visualized. No evidence of pulmonic stenosis. Aorta: The aortic root is normal in size and structure. Venous: The inferior vena cava is normal in size with greater than 50% respiratory variability, suggesting right atrial pressure of 3 mmHg. IAS/Shunts: No atrial level shunt detected by color flow Doppler.  LEFT VENTRICLE PLAX 2D LVIDd:         4.22 cm  Diastology LVIDs:         2.89 cm  LV e' lateral:   4.79 cm/s LV PW:         1.27 cm  LV E/e' lateral: 15.8 LV IVS:        1.18 cm  LV e' medial:    5.55 cm/s LVOT diam:     2.00 cm  LV E/e' medial:  13.7 LV SV:         56 LV SV Index:   35 LVOT Area:     3.14 cm  RIGHT VENTRICLE RV Basal diam:  2.92 cm RV S prime:     16.50 cm/s TAPSE (M-mode): 4.4 cm LEFT ATRIUM             Index       RIGHT ATRIUM           Index LA diam:        3.00 cm 1.86 cm/m  RA Area:     14.20 cm LA  Vol [facility]):   51.9 ml 32.23 ml/m RA Volume:   33.90 ml  21.05 ml/m LA Vol (A4C):   62.1 ml 38.56 ml/m LA Biplane Vol: 56.5 ml 35.08 ml/m  AORTIC VALVE                    PULMONIC VALVE AV Area (Vmax):    0.41 cm     PV Vmax:        0.76 m/s AV Area (Vmean):   0.39 cm     PV Peak grad:   2.3 mmHg AV Area (VTI):     0.42 cm     RVOT Peak grad: 5 mmHg AV Vmax:           551.33 cm/s AV Vmean:          441.333 cm/s AV VTI:            1.330 m AV Peak Grad:      121.6 mmHg AV Mean Grad:      84.7 mmHg LVOT Vmax:         71.80 cm/s LVOT  Vmean:        54.200 cm/s LVOT VTI:          0.179 m LVOT/AV VTI ratio: 0.13  AORTA Ao Root diam: 2.80 cm MITRAL VALVE                TRICUSPID VALVE MV Area (PHT): 3.13 cm     TR Peak grad:   19.2 mmHg MV Decel Time: 242 msec     TR Vmax:        219.00 cm/s MV E velocity: 75.80 cm/s MV A velocity: 102.00 cm/s  SHUNTS MV E/A ratio:  0.74         Systemic VTI:  0.18 m                             Systemic Diam: 2.00 cm Ida Rogue MD Electronically signed by Ida Rogue MD Signature Date/Time: 09/09/2019/1:24:57 PM    Final     Cardiac Studies   Echocardiogram was done yesterday and was personally reviewed by me: 1. Left ventricular ejection fraction, by estimation, is 55 to 60%. The  left ventricle has normal function. The left ventricle has no regional  wall motion abnormalities. There is moderate left ventricular hypertrophy.  Left ventricular diastolic  parameters are consistent with Grade I diastolic dysfunction (impaired  relaxation).  2. Right ventricular systolic function is normal. The right ventricular  size is normal. There is normal pulmonary artery systolic pressure. The  estimated right ventricular systolic pressure is 08.6 mmHg.  3. Mild mitral valve regurgitation.  4. The aortic valve was not well visualized. Appears heavily calcified.  Aortic valve regurgitation is not visualized. Critical aortic valve  stenosis. Aortic valve area, by VTI measures 0.42 cm. Aortic valve mean  gradient measures 84.7 mmHg. Aortic  valve Vmax measures 5.51 m/s.   Right heart catheterization: Angiography was performed by me today:  1.  Significant one-vessel coronary artery disease with 90% proximal stenosis in the right coronary artery.  Moderate mid LAD stenosis.  Moderate to heavy calcifications are noted. 2.  Severely calcified aortic valve with restricted opening.  I did not attempt to cross the aortic valve. 3.  Right heart catheterization showed normal filling pressures, normal  pulmonary pressure and normal cardiac output.  Recommendations: The patient has critical aortic stenosis and one-vessel coronary  artery disease with associated syncope.  Given the patient's relatively young age, I suspect that he has bicuspid aortic valve. Recommend transfer to [facility] for evaluation of AVR and one-vessel CABG. Resume heparin 8 hours after sheath pull.   Patient Profile     59 y.o. 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  Troponin was mildly elevated.  Assessment & Plan    1.  Critical aortic stenosis: Valve area of 0.42 with a mean gradient of 84 mmHg.  This is the likely culprit for his syncopal episode although arrhythmia cannot be completely excluded. Given his relatively young age, he likely has bicuspid aortic valve. Cardiac catheterization was done today which showed normal filling pressures, normal pulmonary pressures and normal cardiac output.  Coronary angiography showed moderate mid LAD stenosis and significant proximal RCA stenosis. I think his best option of management is aortic valve replacement and one-vessel CABG.  This has to be done while the patient is hospitalized as he is at high risk for sudden cardiac death. Thus, we will transfer the patient to [facility] for further management of this.  The patient was accepted by our team (Dr. Meda Coffee) at [facility].  2.  Small non-ST elevation myocardial infarction: Could be due to supply demand ischemia but there is also a hazy appearing lesion in the proximal RCA which is also calcified.  Recommend resuming heparin 8 hours after sheath pull and the patient will need one-vessel CABG.  3.  Tobacco use: Smoking cessation is recommended.       For questions or updates, please contact [place] Please consult [email or web address] for contact info under        Signed, Kathlyn [place], MD    09/10/2019, 10:48 AM

## 2019-09-10 NOTE — Progress Notes (Signed)
ANTICOAGULATION CONSULT NOTE -  Pharmacy Consult for Heparin Indication: chest pain/ACS  Allergies  Allergen Reactions  . Benadryl [Diphenhydramine]     Racing heart, syncope    Vital Signs: Temp: 98.4 F (36.9 C) (08/20 2151) Temp Source: Oral (08/20 2151) BP: 141/97 (08/20 2151) Pulse Rate: 86 (08/20 2151)  Labs: Recent Labs    09/08/19 1755 09/09/19 0018 09/09/19 0120 09/09/19 0310 09/09/19 1323 09/09/19 1350 09/09/19 2100 09/10/19 0653  HGB 14.9  --  16.2  --   --   --   --   --   HCT 43.7  --  47.2  --   --   --   --   --   PLT 180  --  199  --   --   --   --   --   APTT  --   --  31  --   --   --   --   --   LABPROT  --   --   --   --   --  14.0  --   --   INR  --   --   --   --   --  1.1  --   --   HEPARINUNFRC  --   --   --   --   --  <0.10* 0.20* 0.50  CREATININE 0.86  --   --   --  0.60*  --   --   --   CKTOTAL  --  100  --   --   --   --   --   --   TROPONINIHS 48* 132*  --  165*  --   --   --   --     Estimated Creatinine Clearance: 81 mL/min (A) (by C-G formula based on SCr of 0.6 mg/dL (L)).   Medical History: Past Medical History:  Diagnosis Date  . Aortic stenosis, severe 09/09/2018  . CAD (coronary artery disease)   . Hypertension     Medications/Assessment: Heparin initiated for ACS.  No anticoagulants per PTA med list.  Trop 65 > 165. May transfer to [facility] for expedited surgery for work up on valve surgery. Plan for RHC and LHC 8/20.   8/19 2100 HL 0.2 subtherapeutic @ 1050 units/hr gave heparin bolus 2000 units + increase to 1200 units/hr.  8/20 0653 HL 0.5 therapeutic @ 1200 units.   Patient now transferred to [facility] for surgery next week, will continue heparin at current rate.   Goal of Therapy:  Heparin level 0.3-0.7 units/ml Monitor platelets by anticoagulation protocol: Yes   Plan:  Continue heparin at 1200 units/hr Recheck heparin level with am labs. CBC daily while on heparin.   Erin Hearing PharmD., BCPS Clinical  Pharmacist 09/10/2019 10:43 PM

## 2019-09-10 NOTE — Discharge Summary (Signed)
Physician Discharge Summary  Patient ID: Dean Obrien MRN: [id number] DOB/AGE: Jun 26, 1960 59 y.o.  Admit date: 09/09/2019 Discharge date: 09/10/2019  Admission Diagnoses: Syncope and collapse                                         Non-ST elevation MI                                         Hypertension                                         Nicotine dependence  Discharge Diagnoses:  Principal Problem:   Syncope and collapse Active Problems:   HYPERTENSION, BENIGN   NSTEMI (non-ST elevated myocardial infarction) ([place])   Nicotine dependence   Critical aortic valve stenosis   Demand ischemia [facility])   Discharged Condition: stable  Hospital Course: Dean Obrien is a 59 year old male with medical history significant for hypertension and nicotine dependence, who presented to the ED after he developed chest pain while outside doing yard work.  The chest pain resolved with rest but when he went inside he had a syncopal episode, awakening to feeling a little dizzy but without recurrence of the chest pain and decided to come in to the emergency room to be evaluated.  The pain was described as retrosternal tightness, nonradiating with no associated nausea vomiting or diaphoresis of low intensity.  Chest pain-free on arrival.  Patient is to have a bump in his troponin levels and was started on a heparin drip for possible non-ST elevation MI.  Patient was seen in consultation by cardiology and had a 2D echocardiogram which showed critical aortic valve stenosis, aortic valve area measures 0.42 cm2.patient noted to have an LVEF of 55 to 60% with no regional wall motion abnormalities.  Patient is status post cardiac catheterization which showed Significant one-vessel coronary artery disease with 90% proximal stenosis in the right coronary artery.  Moderate mid LAD stenosis. Given the fact that patient has critical aortic stenosis, one-vessel coronary artery disease with associated syncope, cardiologist  has recommended transfer to [facility] for evaluation of AVR and one-vessel CABG. Patient was seen and examined at bedside postprocedure and is agreeable to the plan you are. He will be transferred to [facility] to the services of Dr. Meda Coffee once bed becomes available.    Consults: cardiology  Significant Diagnostic Studies: angiography:   Treatments: anticoagulation: LMW heparin  Discharge Exam: Blood pressure 126/86, pulse 72, temperature 97.9 F (36.6 C), temperature source Oral, resp. rate 20, height 5\' 3"  (1.6 m), weight 65.3 kg, SpO2 97 %. General appearance: appears stated age Head: Normocephalic, without obvious abnormality, atraumatic Neck: no adenopathy, no carotid bruit, no JVD, supple, symmetrical, trachea midline and thyroid not enlarged, symmetric, no tenderness/mass/nodules Resp: clear to auscultation bilaterally GI: soft, non-tender; bowel sounds normal; no masses,  no organomegaly Extremities: extremities normal, atraumatic, no cyanosis or edema  Disposition: Discharge disposition: [place] Not Defined       Discharge Instructions    Diet - low sodium heart healthy   Complete by: As directed    Discharge instructions   Complete by: As directed    Patient is  being transferred to [facility] to the services of Dr Meda Coffee for further evaluation and treatment   Increase activity slowly   Complete by: As directed      Allergies as of 09/10/2019      Reactions   Benadryl [diphenhydramine]    Racing heart, syncope      Medication List    STOP taking these medications   lisinopril 10 MG tablet Commonly known as: ZESTRIL   loratadine 10 MG tablet Commonly known as: CLARITIN   terbinafine 250 MG tablet Commonly known as: LamISIL     TAKE these medications   aspirin 81 MG EC tablet Take 1 tablet (81 mg total) by mouth daily. Swallow whole. Start taking on: September 11, 2019   atorvastatin 40 MG tablet Commonly known as:  LIPITOR Take 1 tablet (40 mg total) by mouth daily.   metoprolol tartrate 25 MG tablet Commonly known as: LOPRESSOR Take 0.5 tablets (12.5 mg total) by mouth 2 (two) times daily.   nicotine 21 mg/24hr patch Commonly known as: NICODERM CQ - dosed in mg/24 hours Place 1 patch (21 mg total) onto the skin daily.   nitroGLYCERIN 0.4 MG SL tablet Commonly known as: NITROSTAT Place 1 tablet (0.4 mg total) under the tongue every 5 (five) minutes x 3 doses as needed for chest pain.        Signed: Gennie Dib 09/10/2019, 12:28 PM

## 2019-09-10 NOTE — H&P (Addendum)
Cardiology Admission History and Physical:   Patient ID: Dean Obrien MRN: [id number]; DOB: [birth year]   Admission date: (Not on file)  Primary Care Provider: Patient, No Pcp Per Primary Cardiologist: [facility], Dr. Rockey Situ Primary Electrophysiologist:  None   Chief Complaint: Critical Aortic Stenosis, Severe 1v CAD   Patient Profile:   Dean Obrien is a 59 y.o. male with h/o critical aortic stenosis (by 8/19 echo and [facility] 09/10/19) with suspicion for bicuspid aortic valve, 1-vessel CAD (R/LHC 09/10/2019: pRCA 90%s, pLCx 20%s, mLAD 50%s), syncope 09/10/19, hypertension, HLD, newly dx prediabetes, long history of tobacco use ~30 years), and transfer from [facility] to [facility] for evaluation of AVR and 1v CABG.  History of Present Illness:   Dean Obrien is a 59 yo male with PMH as above.    He denies any family history of early cardiac death.  He reports a strong family history of hypertension.    He was previously evaluated by [facility] cardiology/Dr. Rockey Situ 11/17/2009 after referral from Dr. Oran Rein for cardiac murmur.  During his 2011 clinic visit,  he denied recent or past symptoms of valvular disease, including SOB/DOE and CP.  He was working on HTN / blood pressure control. Clinic BP 161/95 (though, in the setting of running out of his lisinopril 10mg ) with home SBP 130-140s on his ACE.  A  2-3/6 SEM was noted in his RUSB and radiating to his LUSB & b/l carotids.  It was thought that this murmur most likely represented aortic valve pathology.  Echo was recommended for further evaluation but echo was never obtained as pt was out of network and also declined the offer to schedule him with another in - network group at that time and pending discussion with PCP.   Since that time, he reports that he has been doing well. He reports a h/o rare palpitations. No reported h/o CP, syncope, dizziness, SOB/DOE, pnd, orthopnea, edema, weight gain, or early satiety.  He reports he is usually  fairly active and eats well but continues to smoke.  On 8/18, he was outside in his yard. It was warm outside but he otherwise felt fine until he he started using his chain saw. He was vigorously cranking the chainsaw to start it when he suddenly became extremely dizzy with acute SOB/dyspnea and nauseousness. He then noted transient 10 second CP that felt like a burning or tightness in the center of his chest and self-resolved. He immediately went inside, where he was still very short of breath and nauseated, so got on his hands and knees on the ground when he believes he lost consciousness (unwitnessed). He is unsure if he hit his head at that time but does note he was low to the ground. Estimated length of LOC unknown; however, he states his family was in a nearby room, and his daughter reportedly told him she estimated LOC ~1 -2 minutes, based on when he came in from outside. When he regained consciousness, his family was already speaking with EMS that had arrived. He remembers he still felt SOB/nausea with some diaphoresis at that time but denied further CP, dizziness, or nausea.    In the [facility] ED 09/09/19, vitals were stable. EKG without acute ST/T changes. Labs showed mildly elevated HS Tn 48  132  165, stable renal function with Cr 0.86, hypokalemia with K 3.2, and leukocytosis with WBC 13.9. UA showed moderate Hgb without bacteria / WBC. Head CT /CXR unremarkable. He was started on IV heparin. When seen for  cardiology consultation, he denied recurrent sx, including CP.  K+ repletion initiated. Given the harsh 3/6 systolic murmur heard on exam and suspicion that syncopal event likely 2/2 worsening aortic valve dz, STAT echocardiogram was ordered to assess his aortic valve.   Echo 09/09/19 showed critical aortic stenosis with aortic valve area by VTI measured 0.42 cm.  Aortic valve mean gradient 84.7 mmHg.  Aortic valve V-max measured 5.51 m/s.  LVEF 55 to 60%, NRWMA, G1DD, PASP 29.2 mmHg, mild MR. Given  his critical aortic stenosis, he was scheduled for [facility] 09/10/2019 to evaluate candidacy for AVR versus TAVR.   R/LHC 09/10/19 at [facility] showed significant 1v CAD with pRCA 90%s, pLCx 20%s, and moderate mLAD 50%s.  He had a severely calcified aortic valve with restricted opening.  Crossing of the aortic valve was not attempted.  RHC showed normal filling pressures, normal pulmonary pressure, and normal cardiac output.    Given the critical aortic stenosis and 1v CAD with associated syncope, as well as the patient's relatively young age, bicuspid aortic valve was suspected.  Recommendation was to transfer to [facility] for evaluation of AVR and 1V CABG.  [facility] Cardiology team, [facility], and [facility] were notified of the plan for transfer.   Plan is to resume IV heparin 8 hours s/p sheath pull or approximately 6 PM tonight. Transfer orders placed and pended for [facility]. During [facility] admission, labs performed for further risk factor stratification noted elevated A1c 5.8 with recommendations for strict glycemic control and follow-up per PCP as below. Started on statin for elevated LDL and recommendation for follow-up labs as below.  Pending BMET today for reassessment of hypokalemia noted at presentation.   Past Medical History:  Diagnosis Date  . Aortic stenosis, severe 09/09/2018  . CAD (coronary artery disease)   . Hypertension     Past Surgical History:  Procedure Laterality Date  . RIGHT/LEFT HEART CATH AND CORONARY ANGIOGRAPHY N/A 09/10/2019   Procedure: RIGHT/LEFT HEART CATH AND CORONARY ANGIOGRAPHY;  Surgeon: Wellington Hampshire, MD;  Location: [place] CV LAB;  Service: Cardiovascular;  Laterality: N/A;     Medications Prior to Admission: Prior to Admission medications   Medication Sig Start Date End Date Taking? Authorizing Provider  aspirin EC 81 MG EC tablet Take 1 tablet (81 mg total) by mouth daily. Swallow whole. 09/11/19   Agbata, Royce Macadamia, MD  atorvastatin (LIPITOR) 40 MG  tablet Take 1 tablet (40 mg total) by mouth daily. 09/10/19 10/10/19  Agbata, Royce Macadamia, MD  lisinopril (PRINIVIL,ZESTRIL) 10 MG tablet Take 10 mg by mouth daily.   Patient not taking: Reported on 09/09/2019    [provider]  loratadine (CLARITIN) 10 MG tablet Take 1 tablet (10 mg total) by mouth daily. Patient not taking: Reported on 09/09/2019 08/23/14   Steele Sizer, MD  metoprolol tartrate (LOPRESSOR) 25 MG tablet Take 0.5 tablets (12.5 mg total) by mouth 2 (two) times daily. 09/10/19 10/10/19  Agbata, Royce Macadamia, MD  nicotine (NICODERM CQ - DOSED IN MG/24 HOURS) 21 mg/24hr patch Place 1 patch (21 mg total) onto the skin daily. 09/10/19   Collier Bullock, MD  nitroGLYCERIN (NITROSTAT) 0.4 MG SL tablet Place 1 tablet (0.4 mg total) under the tongue every 5 (five) minutes x 3 doses as needed for chest pain. 09/10/19 10/10/19  Agbata, Royce Macadamia, MD  terbinafine (LAMISIL) 250 MG tablet Take 1 tablet (250 mg total) by mouth daily. Patient not taking: Reported on 09/09/2019 06/29/13   Wallene Huh, DPM  Allergies:    Allergies  Allergen Reactions  . Benadryl [Diphenhydramine]     Racing heart, syncope    Social History:   Social History   Socioeconomic History  . Marital status: Married    Spouse name: Not on file  . Number of children: Not on file  . Years of education: Not on file  . Highest education level: Not on file  Occupational History  . Occupation: Social research officer, government (desk job)    Comment: Retired  . 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 Smoking status: Current Every Day Smoker    Packs/day: 1.50    Years: 35.00    Pack years: 52.50  . Smokeless tobacco: Never Used  Vaping Use  . Vaping Use: Never used  Substance and Sexual Activity  . Alcohol use: No  . Drug use: No  . Sexual activity: Yes    Partners: Female    Birth control/protection: None  Other Topics Concern  . Not on file  Social History Narrative   Pt gets regular exercise.   Social  Determinants of Health   Financial Resource Strain:   . Difficulty of Paying Living Expenses: Not on file  Food Insecurity:   . Worried About Charity fundraiser in the Last Year: Not on file  . Ran Out of Food in the Last Year: Not on file  Transportation Needs:   . Lack of Transportation (Medical): Not on file  . Lack of Transportation (Non-Medical): Not on file  Physical Activity:   . Days of Exercise per Week: Not on file  . Minutes of Exercise per Session: Not on file  Stress:   . Feeling of Stress : Not on file  Social Connections:   . Frequency of Communication with Friends and Family: Not on file  . Frequency of Social Gatherings with Friends and Family: Not on file  . Attends Religious Services: Not on file  . Active Member of Clubs or Organizations: Not on file  . Attends Archivist Meetings: Not on file  . Marital Status: Not on file  Intimate Partner Violence:   . Fear of Current or Ex-Partner: Not on file  . Emotionally Abused: Not on file  . Physically Abused: Not on file  . Sexually Abused: Not on file    Family History:   The patient's family history includes Emphysema in his mother; Hypertension in his brother and sister; Liver disease in his mother; Lung cancer in his father.    He denies any family history of early cardiac death.  He reports a strong family history of hypertension.    ROS:  Please see the history of present illness.   Today, he denies current chest pain, palpitations, dyspnea, pnd, orthopnea, n, v, dizziness, syncope, edema, weight gain, or early satiety.   Physical Exam/Data:  There were no vitals filed for this visit. No intake or output data in the 24 hours ending 09/10/19 1739 Last 3 Weights 09/10/2019 09/09/2019 09/08/2019  Weight (lbs) 143 lb 14.4 oz 130 lb 1.1 oz 145 lb  Weight (kg) 65.273 kg 59 kg 65.772 kg     There is no height or weight on file to calculate BMI.  Please refer to MD rounding / progress note from today  for physical exam.  EKG:  No new tracings. The ECG that was done 09/09/19 was personally reviewed and demonstrates NSR, 92bpm, LVH, no acute ST/T changes.  Relevant CV Studies:  Echocardiogram  09/09/19 1. Left ventricular ejection fraction, by estimation, is 55 to 60%. The  left ventricle has normal function. The left ventricle has no regional  wall motion abnormalities. There is moderate left ventricular hypertrophy.  Left ventricular diastolic  parameters are consistent with Grade I diastolic dysfunction (impaired  relaxation).  2. Right ventricular systolic function is normal. The right ventricular  size is normal. There is normal pulmonary artery systolic pressure. The  estimated right ventricular systolic pressure is 02.7 mmHg.  3. Mild mitral valve regurgitation.  4. The aortic valve was not well visualized. Appears heavily calcified.  Aortic valve regurgitation is not visualized. Critical aortic valve  stenosis. Aortic valve area, by VTI measures 0.42 cm. Aortic valve mean  gradient measures 84.7 mmHg. Aortic  valve Vmax measures 5.51 m/s.   Right / Left heart catheterization 09/10/19 1. Significant one-vessel coronary artery disease with 90% proximal stenosis in the right coronary artery. Moderate mid LAD stenosis. Moderate to heavy calcifications are noted. 2. Severely calcified aortic valve with restricted opening. I did not attempt to cross the aortic valve. 3. Right heart catheterization showed normal filling pressures, normal pulmonary pressure and normal cardiac output. --Recommendations: The patient has critical aortic stenosis and one-vessel coronary artery disease with associated syncope. Given the patient's relatively young age, I suspect that he has bicuspid aortic valve. Recommend transfer to [facility] for evaluation of AVR and one-vessel CABG. Resume heparin 8 hours after sheath pull.  Laboratory Data:  High Sensitivity Troponin:   Recent Labs  Lab  09/08/19 1755 09/09/19 0018 09/09/19 0310  TROPONINIHS 48* 132* 165*      Cardiac EnzymesNo results for input(s): TROPONINI in the last 168 hours. No results for input(s): TROPIPOC in the last 168 hours.  Chemistry Recent Labs  Lab 09/08/19 1755 09/09/19 1323  NA 137 138  K 3.2* 3.8  CL 101 104  CO2 25 23  GLUCOSE 104* 117*  BUN 12 12  CREATININE 0.86 0.60*  CALCIUM 9.2 9.2  GFRNONAA >60 >60  GFRAA >60 >60  ANIONGAP 11 11    Recent Labs  Lab 09/09/19 0120  PROT 5.3*  ALBUMIN 3.0*  AST 16  ALT 15  ALKPHOS 37*  BILITOT 0.6   Hematology Recent Labs  Lab 09/08/19 1755 09/09/19 0120  WBC 13.9* 14.9*  RBC 4.76 5.18  HGB 14.9 16.2  HCT 43.7 47.2  MCV 91.8 91.1  MCH 31.3 31.3  MCHC 34.1 34.3  RDW 13.1 13.2  PLT 180 199   BNPNo results for input(s): BNP, PROBNP in the last 168 hours.  DDimer No results for input(s): DDIMER in the last 168 hours.   Radiology/Studies:  CARDIAC CATHETERIZATION  Result Date: 09/10/2019  Prox Cx lesion is 20% stenosed.  Mid LAD lesion is 50% stenosed.  Prox RCA lesion is 90% stenosed.  1.  Significant one-vessel coronary artery disease with 90% proximal stenosis in the right coronary artery.  Moderate mid LAD stenosis.  Moderate to heavy calcifications are noted. 2.  Severely calcified aortic valve with restricted opening.  I did not attempt to cross the aortic valve. 3.  Right heart catheterization showed normal filling pressures, normal pulmonary pressure and normal cardiac output. Recommendations: The patient has critical aortic stenosis and one-vessel coronary artery disease with associated syncope.  Given the patient's relatively young age, I suspect that he has bicuspid aortic valve. Recommend transfer to [facility] for evaluation of AVR and one-vessel CABG. Resume heparin 8 hours after sheath pull.    Assessment and  Plan:   Critical Aortic Valve Stenosis --No current CP/SOB. SEM first appreciated in 2011. Asx at that time.  No echo  performed in 2011. Admitted to [facility] after 09/09/19 syncope as above. 8/19 echo with valve area of 0.42 with mean gradient 84 mmHg. 09/10/2019 L/RHC with nl filling and pulmonary pressures, normal CO.  Angiography with moderate mLAD stenosis and significant RCA stenosis. Given his young age /severity of AS, bicuspid aortic valve suspected with further evaluation during AVR and CABG.  --Transfer to [facility] recommended for AVR and one-vessel CABG. Recommendation was that AVR and CABG be performed this hospitalization given he is at high risk of sudden cardiac death. [facility] team at [facility] contacted and agreeable to plan with [facility] contacted for transfer. NPO after midnight with plan for AVR / 1v CAD tomorrow.   09/09/19 Syncopal Event --As in HPI and occurred 8/19. No h/o previous syncopal event. No recurrent syncope since 8/19. Echo / catheterization as above at [facility] and showing severe 1v CAD and critical AS.    --Critical AS thought likely etiology of 8/19 syncopal episode, though arrhythmia cannot completely be excluded. Continue to monitor electrolytes with K goal 4.0 and Mg goal 2.0. Recommend ongoing telemetry monitoring during pt hospitalization +/- outpatient cardiac monitoring with 2 week Zio-AT if no significant arrhythmia or ectopy seen this hospitalization.   Non-STEMI  Severe 1v CAD (09/10/19: pRCA 90%s, pLCx 20%s, mLAD 50%s) --No current CP. CP on 8/19 as in HPI and has not recurred.  --EKG NSR without acute ST/T changes.  --HS Tn elevated but not cycled until peaked with last Tn value 165. --Angiography with pRCA 90%s, pLCx 20%s, mLAD 50%s. --Etiology of HS Tn elevation likely 2/2 supply demand ischemia but cannot rule out elevation 2/2 cardiac ischemia given 8/20 angiography with hazy appearing lesion in the RCA, which was also noted as calcified. --Resume heparin 8 hours after sheath pull today (~6PM).  --Daily BMET, CBC.  --NPO after midnight tonight.  --Plan for 1v CABG as above.  Continue ASA, BB, statin.  Escalate GDMT as tolerated. Consider ACE/ARB before discharge. Ongoing risk factor modification with LDL goal <70 and control/tx of elevated A1c / monitoring per PCP.  HLD, LDL goal <70 --Total cholesterol 146 with LDL 87.  Baseline LFTs WNL.   --Started on atorvastatin 40 mg this admission. --Goal LDL below 70. Follow-up lipid and liver function labs recommended in 6 to 8 weeks.  Elevated A1C, pre DM2 --Hemoglobin A1c elevated 09/09/2019 at 5.8.  He denies a history of DM2 before this time.   --Recommend strict glycemic control for risk factor modification with follow-up treatment and management per PCP.  Given comorbid HTN, consider ACE/ARB before discharge per GDMT if Cr allows.  Hypokalemia --Most recent K 3.8. Noel Rodier order BMET for today as not yet obtained. Recommend maintain K goal 4.0, Mg goal 2.0 to reduce risk for arrhythmia.  Tobacco use --Complete cessation recommended. Continue current nicotine patch.  HTN --Continue current medications. BP currently well controlled.  Severity of Illness: The appropriate patient status for this patient is INPATIENT. Inpatient status is judged to be reasonable and necessary in order to provide the required intensity of service to ensure the patient's safety. The patient's presenting symptoms, physical exam findings, and initial radiographic and laboratory data in the context of their chronic comorbidities is felt to place them at high risk for further clinical deterioration. Furthermore, it is not anticipated that the patient Keesha Pellum be medically stable for discharge from the hospital within  2 midnights of admission. The following factors support the patient status of inpatient.   " The patient's presenting symptoms include critical aortic stenosis with recent syncopal event, severe one-vessel CAD with elevated troponin. " The worrisome physical exam findings include critical aortic stenosis, severe 1 vessel CAD. " The  initial radiographic and laboratory data are worrisome because of critical aortic stenosis, severe 1 vessel CAD. " The chronic co-morbidities include newly diagnosed pre DM 2/elevated A1c, hypertension, tobacco use, critical AS, severe one-vessel CAD.   * I certify that at the point of admission it is my clinical judgment that the patient Cyrena Kuchenbecker require inpatient hospital care spanning beyond 2 midnights from the point of admission due to high intensity of service, high risk for further deterioration and high frequency of surveillance required.*    For questions or updates, please contact [place] Please consult [email or web address] for contact info under        Signed, Arvil Chaco, PA-C  09/10/2019 5:39 PM    I have seen and examined this patient with Marrianne Mood.  Agree with above, note added to reflect my findings.  On exam, RRR, 6-2/9 systolic murmur.  Patient admitted to [place] after having dizziness, shortness of breath and nausea when cranking a chainsaw.  He also had chest pain.  He was found to have a mildly elevated troponin.  Echo showed severe aortic stenosis with a valve area of 0.42 cm.  His mean gradient was 84 mmHg.  He was also found to have single-vessel coronary artery disease.  He was is transferred to [facility] for AVR CABG.  We [facility] alert cardiac surgery to the transfer.  We Cardarius Senat continue on heparin.  Renezmae Canlas M. Adeena Bernabe MD 09/11/2019 9:31 AM

## 2019-09-10 NOTE — Progress Notes (Addendum)
ANTICOAGULATION CONSULT NOTE -  Pharmacy Consult for Heparin Indication: chest pain/ACS  Allergies  Allergen Reactions  . Benadryl [Diphenhydramine]     Racing heart, syncope    Patient Measurements: Height: 5\' 3"  (160 cm) Weight: 65.3 kg (143 lb 14.4 oz) (he had blue jeans on) IBW/kg (Calculated) : 56.9 HEPARIN DW (KG): 65.3  Vital Signs: Temp: 98.2 F (36.8 C) (08/20 0813) Temp Source: Oral (08/20 0813) BP: 111/74 (08/20 0945) Pulse Rate: 71 (08/20 0813)  Labs: Recent Labs    09/08/19 1755 09/09/19 0018 09/09/19 0120 09/09/19 0310 09/09/19 1323 09/09/19 1350 09/09/19 2100 09/10/19 0653  HGB 14.9  --  16.2  --   --   --   --   --   HCT 43.7  --  47.2  --   --   --   --   --   PLT 180  --  199  --   --   --   --   --   APTT  --   --  31  --   --   --   --   --   LABPROT  --   --   --   --   --  14.0  --   --   INR  --   --   --   --   --  1.1  --   --   HEPARINUNFRC  --   --   --   --   --  <0.10* 0.20* 0.50  CREATININE 0.86  --   --   --  0.60*  --   --   --   CKTOTAL  --  100  --   --   --   --   --   --   TROPONINIHS 48* 132*  --  165*  --   --   --   --     Estimated Creatinine Clearance: 81 mL/min (A) (by C-G formula based on SCr of 0.6 mg/dL (L)).   Medical History: Past Medical History:  Diagnosis Date  . Hypertension     Medications/Assessment: Heparin initiated for ACS.  No anticoagulants per PTA med list.  Trop 65 > 165. May transfer to [facility] for expedited surgery for work up on valve surgery. Plan for RHC and LHC 8/20.   8/19 2100 HL 0.2 subtherapeutic @ 1050 units/hr gave heparin bolus 2000 units + increase to 1200 units/hr.  8/20 0653 HL 0.5 therapeutic @ 1200 units.   Goal of Therapy:  Heparin level 0.3-0.7 units/ml Monitor platelets by anticoagulation protocol: Yes   Plan:  Heparin level was therapeutic. Plan to restart heparin infusion 8 hours post sheath removal.  Will continue rate at 1200 units/hr starting at 1730. Recheck  heparin level in 6 hours. CBC daily while on heparin. Cards recommend transfer to [facility] for evaluation of AVR and one-vessel CABG   Oswald Hillock, PharmD, BCPS Clinical Pharmacist 09/10/2019 10:01 AM

## 2019-09-10 NOTE — Progress Notes (Signed)
Patient returned from procedure  Radial and brachial site WNL-  Small amount of numbness noted.  Denies pain, 2+ pulse and cap refill.  Will continue to monitor numbness.   Instructed patient not to use right arm for anything heavy or for lifting

## 2019-09-10 NOTE — Interval H&P Note (Signed)
Cath Lab Visit (complete for each Cath Lab visit)  Clinical Evaluation Leading to the Procedure:   ACS: Yes.    Non-ACS:  n/a   History and Physical Interval Note:  09/10/2019 9:05 AM  Dean Obrien  has presented today for surgery, with the diagnosis of Right and left heart cardiac catheterization and coronary angiography with possible percutaneous intervention.  The various methods of treatment have been discussed with the patient and family. After consideration of risks, benefits and other options for treatment, the patient has consented to  Procedure(s): RIGHT/LEFT HEART CATH AND CORONARY ANGIOGRAPHY (N/A) as a surgical intervention.  The patient's history has been reviewed, patient examined, no change in status, stable for surgery.  I have reviewed the patient's chart and labs.  Questions were answered to the patient's satisfaction.     Kathlyn Sacramento

## 2019-09-10 NOTE — Progress Notes (Signed)
Patient has arrived from [facility], patient denies chest pain, shortness of breath and palpitations. Patient on heparin drip. Paged Cardiology awaiting for orders.

## 2019-09-10 NOTE — Progress Notes (Signed)
ANTICOAGULATION CONSULT NOTE -  Pharmacy Consult for Heparin Indication: chest pain/ACS  Allergies  Allergen Reactions  . Benadryl [Diphenhydramine]     Racing heart, syncope    Patient Measurements: Height: 5\' 3"  (160 cm) Weight: 65.3 kg (143 lb 14.4 oz) (he had blue jeans on) IBW/kg (Calculated) : 56.9 HEPARIN DW (KG): 65.3  Vital Signs: Temp: 98 F (36.7 C) (08/20 0515) Temp Source: Oral (08/20 0515) BP: 127/84 (08/20 0515) Pulse Rate: 76 (08/20 0515)  Labs: Recent Labs    09/08/19 1755 09/09/19 0018 09/09/19 0120 09/09/19 0310 09/09/19 1323 09/09/19 1350 09/09/19 2100 09/10/19 0653  HGB 14.9  --  16.2  --   --   --   --   --   HCT 43.7  --  47.2  --   --   --   --   --   PLT 180  --  199  --   --   --   --   --   APTT  --   --  31  --   --   --   --   --   LABPROT  --   --   --   --   --  14.0  --   --   INR  --   --   --   --   --  1.1  --   --   HEPARINUNFRC  --   --   --   --   --  <0.10* 0.20* 0.50  CREATININE 0.86  --   --   --  0.60*  --   --   --   CKTOTAL  --  100  --   --   --   --   --   --   TROPONINIHS 48* 132*  --  165*  --   --   --   --     Estimated Creatinine Clearance: 81 mL/min (A) (by C-G formula based on SCr of 0.6 mg/dL (L)).   Medical History: Past Medical History:  Diagnosis Date  . Hypertension     Medications/Assessment: Heparin initiated for ACS.  No anticoagulants per PTA med list.  Trop 65 > 165. May transfer to [facility] for expedited surgery for work up on valve surgery. Plan for [facility]HC and LHC 8/20.   8/19 2100 HL 0.2 subtherapeutic @ 1050 units/hr gave heparin bolus 2000 units + increase to 1200 units/hr.  8/20 0653 HL 0.5 therapeutic @ 1200 units.   Goal of Therapy:  Heparin level 0.3-0.7 units/ml Monitor platelets by anticoagulation protocol: Yes   Plan:  Heparin level is therapeutic. Will continue rate at 1200 units/hr. Recheck heparin level in 6 hours. CBC daily while on heparin.   Will recheck HL in 6 hours per  protocol  Oswald Hillock, PharmD, BCPS Clinical Pharmacist 09/10/2019 7:48 AM

## 2019-09-11 ENCOUNTER — Inpatient Hospital Stay (HOSPITAL_COMMUNITY)

## 2019-09-11 DIAGNOSIS — I350 Nonrheumatic aortic (valve) stenosis: Secondary | ICD-10-CM

## 2019-09-11 DIAGNOSIS — I2510 Atherosclerotic heart disease of native coronary artery without angina pectoris: Secondary | ICD-10-CM

## 2019-09-11 LAB — BASIC METABOLIC PANEL
Anion gap: 11 (ref 5–15)
BUN: 14 mg/dL (ref 6–20)
CO2: 22 mmol/L (ref 22–32)
Calcium: 9.1 mg/dL (ref 8.9–10.3)
Chloride: 102 mmol/L (ref 98–111)
Creatinine, Ser: 0.71 mg/dL (ref 0.61–1.24)
GFR calc Af Amer: >60 mL/min (ref >60)
GFR calc non Af Amer: >60 mL/min (ref >60)
Glucose, Bld: 95 mg/dL (ref 70–99)
Potassium: 3.7 mmol/L (ref 3.5–5.1)
Sodium: 135 mmol/L (ref 135–145)

## 2019-09-11 LAB — CBC
HCT: 45.6 % (ref 39.0–52.0)
Hemoglobin: 15.5 g/dL (ref 13.0–17.0)
MCH: 31.4 pg (ref 26.0–34.0)
MCHC: 34 g/dL (ref 30.0–36.0)
MCV: 92.5 fL (ref 80.0–100.0)
Platelets: 168 10*3/uL (ref 150–400)
RBC: 4.93 MIL/uL (ref 4.22–5.81)
RDW: 12.9 % (ref 11.5–15.5)
WBC: 12.7 10*3/uL — ABNORMAL HIGH (ref 4.0–10.5)
nRBC: 0 % (ref 0.0–0.2)

## 2019-09-11 LAB — HEPARIN LEVEL (UNFRACTIONATED): Heparin Unfractionated: 0.29 IU/mL — ABNORMAL LOW (ref 0.30–0.70)

## 2019-09-11 LAB — PROTIME-INR
INR: 1.1 (ref 0.8–1.2)
Prothrombin Time: 13.7 seconds (ref 11.4–15.2)

## 2019-09-11 NOTE — Plan of Care (Signed)

## 2019-09-11 NOTE — Consult Note (Signed)
[address]       [place] [address]             [phone]      Cardiothoracic Surgery Consultation  Reason for Consult: Critical aortic stenosis and two-vessel coronary disease Referring Physician: Dr. Allegra Lai  Dean Obrien is an 59 y.o. male.  HPI:   The patient is a 59 year old gentleman with history of hypertension, hyperlipidemia, prediabetes, and a long history of heavy tobacco abuse who currently smokes about 1/2 pack of cigarettes per day who has a history of a systolic heart murmur that was felt to be due to aortic valve disease but the patient never had an echocardiogram due to being out of the insurance network for a physician he was seeing.  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  On 09/08/2019 he was outside in his yard vigorously cracking a chainsaw when he suddenly became very dizzy and acutely short of breath and nauseous.  He had some transient chest discomfort that he described as a burning tightness and immediately went inside where he was very short of breath.  He got down on his hands and knees on the ground and believes he lost consciousness.  His daughter is with him today and said that he passed out for about 1 to 2 minutes.  He was taken to [facility] where electrocardiogram showed no acute ST-T changes.  He had mildly elevated high-sensitivity troponin at 48>132>165.  Head CT was unremarkable.  He was seen for cardiology consultation and underwent a echocardiogram showing critical aortic stenosis with a peak velocity of 5.5 m/s.  Aortic valve mean gradient was 85.  Left ventricular ejection fraction was 55 to 60%.  He underwent cardiac catheterization which showed 90% proximal RCA stenosis.  The LAD had a 50% mid vessel calcified stenosis over a couple centimeters.  The left circumflex had minimal proximal narrowing.  He was  transferred to [facility] for surgical consultation.  There is no family history of aortic valve disease, aortic aneurysm, or connective tissue disorder.  The patient is here today with his daughter.  He still smokes about 1/2 pack of cigarettes per day.  He said that he has not been to the dentist in a couple years but has had several extractions about teeth in the past.  He denies any current dental problems.  Past Medical History:  Diagnosis Date  . Aortic stenosis, severe 09/09/2018   RHC 09/10/19 Heavily calcified AV. VTI 0.42 cm. AV mean gradient 84.7 mmHg. Vmax measures 5.51 m/s. nl filling pressures, nl pulmonary pressure, nl CO.  Marland Kitchen CAD (coronary artery disease)    LHC 09/10/2019: pRCA 90%s, pLCx 20%s, mLAD 50%s  . Diastolic dysfunction    5/78/4696  . Hyperlipidemia LDL goal <70    8/19 LDL 87  . Hypertension   . Prediabetes     Past Surgical History:  Procedure Laterality Date  . RIGHT/LEFT HEART CATH AND CORONARY ANGIOGRAPHY N/A 09/10/2019   Procedure: RIGHT/LEFT HEART CATH AND CORONARY ANGIOGRAPHY;  Surgeon: Wellington Hampshire, MD;  Location: [facility];  Service: Cardiovascular;  Laterality: N/A;    Family History  Problem Relation Age of Onset  . Emphysema Mother   . Liver disease Mother   . Lung cancer Father   . Hypertension Sister   . Hypertension Brother     Social History:  reports that he has been smoking. He has a 52.50 pack-year smoking history. He has never used smokeless tobacco. He reports that he does not drink alcohol and does not use drugs.  Allergies:  Allergies  Allergen Reactions  . Benadryl [Diphenhydramine]     Racing heart, syncope    Medications:  I have reviewed the patient's current medications. Prior to Admission:  Medications Prior to Admission  Medication Sig Dispense Refill Last Dose  . aspirin EC 81 MG EC tablet Take 1 tablet (81 mg total) by mouth daily. Swallow whole. 30 tablet 11   . atorvastatin (LIPITOR) 40 MG  tablet Take 1 tablet (40 mg total) by mouth daily. 30 tablet 0   . metoprolol tartrate (LOPRESSOR) 25 MG tablet Take 0.5 tablets (12.5 mg total) by mouth 2 (two) times daily. 30 tablet 0   . nicotine (NICODERM CQ - DOSED IN MG/24 HOURS) 21 mg/24hr patch Place 1 patch (21 mg total) onto the skin daily. 28 patch 0   . nitroGLYCERIN (NITROSTAT) 0.4 MG SL tablet Place 1 tablet (0.4 mg total) under the tongue every 5 (five) minutes x 3 doses as needed for chest pain. 30 tablet 12    Scheduled: . aspirin EC  81 mg Oral Daily  . atorvastatin  40 mg Oral Daily  . metoprolol tartrate  12.5 mg Oral BID  . nicotine  21 mg Transdermal Daily   Continuous: . heparin 1,300 Units/hr (09/11/19 1503)   WNU:[id number], nitroGLYCERIN, ondansetron (ZOFRAN) IV  Results for orders placed or performed during the hospital encounter of 09/10/19 (from the past 48 hour(s))  Glucose, capillary     Status: None   Collection Time: 09/10/19  9:59 PM  Result Value Ref Range   Glucose-Capillary 83 70 - 99 mg/dL    Comment: Glucose reference range applies only to samples taken after fasting for at least 8 hours.  APTT     Status: Abnormal   Collection Time: 09/10/19 10:20 PM  Result Value Ref Range   aPTT 50 (H) 24 - 36 seconds    Comment:        IF BASELINE aPTT IS ELEVATED, SUGGEST PATIENT RISK ASSESSMENT BE USED TO DETERMINE APPROPRIATE ANTICOAGULANT THERAPY. Performed at [facility], [place] [address] [place], [place] [address]   Basic metabolic panel     Status: None   Collection Time: 09/11/19  2:18 AM  Result Value Ref Range   Sodium 135 135 - 145 mmol/L   Potassium 3.7 3.5 - 5.1 mmol/L   Chloride 102 98 - 111 mmol/L   CO2 22 22 - 32 mmol/L   Glucose, Bld 95 70 - 99 mg/dL    Comment: Glucose reference range applies only to samples taken after fasting for at least 8 hours.   BUN 14 6 - 20 mg/dL   Creatinine, Ser 0.71 0.61 - 1.24 mg/dL   Calcium 9.1 8.9 - 10.3 mg/dL   GFR calc non Af Amer >60  >60 mL/min   GFR calc Af Amer >60 >60 mL/min   Anion gap 11 5 - 15    Comment: Performed at [facility] [address] [place], [place] [address]  Protime-INR     Status: None   Collection Time: 09/11/19  2:18 AM  Result Value Ref Range   Prothrombin Time 13.7 11.4 - 15.2 seconds   INR 1.1 0.8 - 1.2    Comment: (NOTE) INR goal varies based on device and disease states. Performed at  [facility], [place] [address], [place], [place] [address]   CBC     Status: Abnormal   Collection Time: 09/11/19  2:18 AM  Result Value Ref Range   WBC 12.7 (H) 4.0 - 10.5 K/uL   RBC 4.93 4.22 - 5.81 MIL/uL   Hemoglobin 15.5 13.0 - 17.0 g/dL   HCT 45.6 39 - 52 %   MCV 92.5 80.0 - 100.0 fL   MCH 31.4 26.0 - 34.0 pg   MCHC 34.0 30.0 - 36.0 g/dL   RDW 12.9 11.5 - 15.5 %   Platelets 168 150 - 400 K/uL   nRBC 0.0 0.0 - 0.2 %    Comment: Performed at [facility], [place] [address] [place], [place] [address]  Heparin level (unfractionated)     Status: Abnormal   Collection Time: 09/11/19  2:18 AM  Result Value Ref Range   Heparin Unfractionated 0.29 (L) 0.30 - 0.70 IU/mL    Comment: (NOTE) If heparin results are below expected values, and patient dosage has  been confirmed, suggest follow up testing of antithrombin III levels. Performed at [facility], [place] [address] [place], [place] [address]     CARDIAC CATHETERIZATION  Result Date: 09/10/2019  Prox Cx lesion is 20% stenosed.  Mid LAD lesion is 50% stenosed.  Prox RCA lesion is 90% stenosed.  1.  Significant one-vessel coronary artery disease with 90% proximal stenosis in the right coronary artery.  Moderate mid LAD stenosis.  Moderate to heavy calcifications are noted. 2.  Severely calcified aortic valve with restricted opening.  I did not attempt to cross the aortic valve. 3.  Right heart catheterization showed normal filling pressures, normal pulmonary pressure and normal cardiac output. Recommendations: The patient has  critical aortic stenosis and one-vessel coronary artery disease with associated syncope.  Given the patient's relatively young age, I suspect that he has bicuspid aortic valve. Recommend transfer to [facility] for evaluation of AVR and one-vessel CABG. Resume heparin 8 hours after sheath pull.    Review of Systems  Constitutional: Negative for activity change, appetite change and fatigue.  HENT: Negative.   Eyes: Negative.   Respiratory: Positive for chest tightness and shortness of breath.   Cardiovascular: Positive for chest pain. Negative for palpitations and leg swelling.  Gastrointestinal: Positive for nausea.  Endocrine: Negative.   Genitourinary: Negative.   Musculoskeletal: Negative.   Skin: Negative.   Allergic/Immunologic: Negative.   Neurological: Positive for dizziness, syncope and headaches.  Hematological: Negative.   Psychiatric/Behavioral: Negative.    Blood pressure 111/73, pulse 72, temperature 98.5 F (36.9 C), temperature source Oral, resp. rate 18, height 5\' 3"  (1.6 m), weight 62.8 kg, SpO2 98 %. Physical Exam Constitutional:      Appearance: Normal appearance. He is normal weight.  HENT:     Head: Normocephalic and atraumatic.     Nose: Nose normal.     Mouth/Throat:     Mouth: Mucous membranes are dry.     Comments: Teeth in fair condition Eyes:     Extraocular Movements: Extraocular movements intact.     Conjunctiva/sclera: Conjunctivae normal.     Pupils: Pupils are equal, round, and reactive to light.  Neck:     Comments: Bilateral cervical bruit or transmitted murmur Cardiovascular:     Rate and Rhythm: Normal rate and regular rhythm.     Pulses: Normal pulses.     Heart sounds: Murmur heard.      Comments: 3/6 systolic murmur  along the right sternal border Pulmonary:     Effort: Pulmonary effort is normal.     Breath sounds: Normal breath sounds.  Abdominal:     General: Abdomen is flat.     Palpations: Abdomen is soft.     Tenderness: There is no  abdominal tenderness.  Musculoskeletal:        General: No swelling. Normal range of motion.     Cervical back: Normal range of motion and neck supple.  Skin:    General: Skin is warm and dry.  Neurological:     General: No focal deficit present.     Mental Status: He is alert and oriented to person, place, and time.  Psychiatric:        Mood and Affect: Mood normal.        Behavior: Behavior normal.     Patient Name:  BAILEN GEFFRE Date of Exam: 09/09/2019  Medical Rec #: [id number]    Height:    63.0 in  Accession #:  [id number]   Weight:    130.1 lb  Date of Birth: Feb 09, 1960    BSA:     1.611 m  Patient Age:  80 years    BP:      120/72 mmHg  Patient Gender: M        HR:      78 bpm.  Exam Location: [facility]   Procedure: 2D Echo, Cardiac Doppler and Color Doppler   Indications:   Syncope 780.2 severe aortic valve stenosis    History:     Patient has no prior history of Echocardiogram  examinations.          Risk Factors:Hypertension.    Sonographer:   Sherrie Sport RDCS (AE)  Referring Phys: [place]  Diagnosing Phys: Ida Rogue MD   IMPRESSIONS    1. Left ventricular ejection fraction, by estimation, is 55 to 60%. The  left ventricle has normal function. The left ventricle has no regional  wall motion abnormalities. There is moderate left ventricular hypertrophy.  Left ventricular diastolic  parameters are consistent with Grade I diastolic dysfunction (impaired  relaxation).  2. Right ventricular systolic function is normal. The right ventricular  size is normal. There is normal pulmonary artery systolic pressure. The  estimated right ventricular systolic pressure is 98.9 mmHg.  3. Mild mitral valve regurgitation.  4. The aortic valve was not well visualized. Appears heavily calcified.  Aortic valve regurgitation is not visualized. Critical aortic valve  stenosis. Aortic valve  area, by VTI measures 0.42 cm. Aortic valve mean  gradient measures 84.7 mmHg. Aortic  valve Vmax measures 5.51 m/s.   FINDINGS  Left Ventricle: Left ventricular ejection fraction, by estimation, is 55  to 60%. The left ventricle has normal function. The left ventricle has no  regional wall motion abnormalities. The left ventricular internal cavity  size was normal in size. There is  moderate left ventricular hypertrophy. Left ventricular diastolic  parameters are consistent with Grade I diastolic dysfunction (impaired  relaxation).   Right Ventricle: The right ventricular size is normal. No increase in  right ventricular wall thickness. Right ventricular systolic function is  normal. There is normal pulmonary artery systolic pressure. The tricuspid  regurgitant velocity is 2.19 m/s, and  with an assumed right atrial pressure of 10 mmHg, the estimated right  ventricular systolic pressure is 21.1 mmHg.   Left Atrium: Left atrial size was normal in size.   Right Atrium: Right atrial size was normal  in size.   Pericardium: There is no evidence of pericardial effusion.   Mitral Valve: The mitral valve is normal in structure. Normal mobility of  the mitral valve leaflets. Mild mitral valve regurgitation. No evidence of  mitral valve stenosis.   Tricuspid Valve: The tricuspid valve is normal in structure. Tricuspid  valve regurgitation is trivial. No evidence of tricuspid stenosis.   Aortic Valve: The aortic valve was not well visualized. Aortic valve  regurgitation is not visualized. Severe aortic stenosis is present. Aortic  valve mean gradient measures 84.7 mmHg. Aortic valve peak gradient  measures 121.6 mmHg. Aortic valve area, by  VTI measures 0.42 cm.   Pulmonic Valve: The pulmonic valve was normal in structure. Pulmonic valve  regurgitation is not visualized. No evidence of pulmonic stenosis.   Aorta: The aortic root is normal in size and structure.   Venous: The  inferior vena cava is normal in size with greater than 50%  respiratory variability, suggesting right atrial pressure of 3 mmHg.   IAS/Shunts: No atrial level shunt detected by color flow Doppler.     LEFT VENTRICLE  PLAX 2D  LVIDd:     4.22 cm Diastology  LVIDs:     2.89 cm LV e' lateral:  4.79 cm/s  LV PW:     1.27 cm LV E/e' lateral: 15.8  LV IVS:    1.18 cm LV e' medial:  5.55 cm/s  LVOT diam:   2.00 cm LV E/e' medial: 13.7  LV SV:     56  LV SV Index:  35  LVOT Area:   3.14 cm     RIGHT VENTRICLE  RV Basal diam: 2.92 cm  RV S prime:   16.50 cm/s  TAPSE (M-mode): 4.4 cm   LEFT ATRIUM       Index    RIGHT ATRIUM      Index  LA diam:    3.00 cm 1.86 cm/m RA Area:   14.20 cm  LA Vol (A2C):  51.9 ml 32.23 ml/m RA Volume:  33.90 ml 21.05 ml/m  LA Vol (A4C):  62.1 ml 38.56 ml/m  LA Biplane Vol: 56.5 ml 35.08 ml/m  AORTIC VALVE          PULMONIC VALVE  AV Area (Vmax):  0.41 cm   PV Vmax:    0.76 m/s  AV Area (Vmean):  0.39 cm   PV Peak grad:  2.3 mmHg  AV Area (VTI):   0.42 cm   RVOT Peak grad: 5 mmHg  AV Vmax:      551.33 cm/s  AV Vmean:     441.333 cm/s  AV VTI:      1.330 m  AV Peak Grad:   121.6 mmHg  AV Mean Grad:   84.7 mmHg  LVOT Vmax:     71.80 cm/s  LVOT Vmean:    54.200 cm/s  LVOT VTI:     0.179 m  LVOT/AV VTI ratio: 0.13    AORTA  Ao Root diam: 2.80 cm   MITRAL VALVE        TRICUSPID VALVE  MV Area (PHT): 3.13 cm   TR Peak grad:  19.2 mmHg  MV Decel Time: 242 msec   TR Vmax:    219.00 cm/s  MV E velocity: 75.80 cm/s  MV A velocity: 102.00 cm/s SHUNTS  MV E/A ratio: 0.74     Systemic VTI: 0.18 m  Systemic Diam: 2.00 cm   Ida Rogue MD  Electronically signed by Ida Rogue MD  Signature Date/Time: 09/09/2019/1:24:57 PM     Physicians  Panel Physicians  Referring Physician Case Authorizing Physician  Wellington Hampshire, MD (Primary)  Marrianne Mood D, PA-C  Procedures  RIGHT/LEFT HEART CATH AND CORONARY ANGIOGRAPHY  Conclusion    Prox Cx lesion is 20% stenosed.  Mid LAD lesion is 50% stenosed.  Prox RCA lesion is 90% stenosed.   1.  Significant one-vessel coronary artery disease with 90% proximal stenosis in the right coronary artery.  Moderate mid LAD stenosis.  Moderate to heavy calcifications are noted. 2.  Severely calcified aortic valve with restricted opening.  I did not attempt to cross the aortic valve. 3.  Right heart catheterization showed normal filling pressures, normal pulmonary pressure and normal cardiac output.  Recommendations: The patient has critical aortic stenosis and one-vessel coronary artery disease with associated syncope.  Given the patient's relatively young age, I suspect that he has bicuspid aortic valve. Recommend transfer to [facility] for evaluation of AVR and one-vessel CABG. Resume heparin 8 hours after sheath pull.  Indications  Critical aortic valve stenosis [I35.0 (ICD-10-CM)]  Non-ST elevation (NSTEMI) myocardial infarction ([place]) [I21.4 (ICD-10-CM)]  Procedural Details  Technical Details Procedural Details: The pre-existing IV in the right antecubital vein was exchanged under sterile fashion to a slender sheath. The right wrist was prepped, draped, and anesthetized with 1% lidocaine. Using the modified Seldinger technique, a 5 French sheath was introduced into the right radial artery. 2.5 mg of verapamil was administered through the sheath, weight-based unfractionated heparin was administered intravenously. Right heart catheterization was performed using a 7 French Swan-Ganz catheter. Cardiac output was calculated by the Fick method. A Jackie catheter was used for selective coronary angiography.  A JR4 catheter was used to engage the right coronary artery.  I did not attempt to cross the aortic  valve.   Catheter exchanges were performed over an exchange length guidewire. There were no immediate procedural complications. A TR band was used for radial hemostasis at the completion of the procedure.  The patient was transferred to the post catheterization recovery area for further monitoring.  Estimated blood loss <50 mL.   During this procedure medications were administered to achieve and maintain moderate conscious sedation while the patient's heart rate, blood pressure, and oxygen saturation were continuously monitored and I was present face-to-face 100% of this time.  Medications (Filter: Administrations occurring from 0856 to 0945 on 09/10/19) (important) Continuous medications are totaled by the amount administered until 09/10/19 0945.  Heparin (Porcine) in NaCl 1000-0.9 UT/500ML-% SOLN (mL) Total volume:  500 mL Date/Time  Rate/Dose/Volume Action  09/10/19 0859  500 mL Given    fentaNYL (SUBLIMAZE) injection (mcg) Total dose:  25 mcg Date/Time  Rate/Dose/Volume Action  09/10/19 0903  25 mcg Given    midazolam (VERSED) injection (mg) Total dose:  1 mg Date/Time  Rate/Dose/Volume Action  09/10/19 0903  1 mg Given    lidocaine (PF) (XYLOCAINE) 1 % injection (mL) Total volume:  2 mL Date/Time  Rate/Dose/Volume Action  09/10/19 0911  2 mL Given    heparin sodium (porcine) injection (Units) Total dose:  3,500 Units Date/Time  Rate/Dose/Volume Action  09/10/19 0922  3,500 Units Given    iohexol (OMNIPAQUE) 300 MG/ML solution (mL) Total volume:  50 mL Date/Time  Rate/Dose/Volume Action  09/10/19 0935  50 mL Given    verapamil (ISOPTIN) injection (mg) Total dose:  2.5 mg Date/Time  Rate/Dose/Volume Action  09/10/19 0914  2.5 mg Given    acetaminophen (TYLENOL) tablet 650 mg (mg) Total dose:  Cannot be calculated* Dosing weight:  65.8 *Administration dose not documented Date/Time  Rate/Dose/Volume Action  09/10/19 0856  *Not included in total MAR Hold    aspirin  EC tablet 81 mg (mg) Total dose:  Cannot be calculated* Dosing weight:  65.8 *Administration dose not documented Date/Time  Rate/Dose/Volume Action  09/10/19 0856  *Not included in total MAR Hold    atorvastatin (LIPITOR) tablet 40 mg (mg) Total dose:  Cannot be calculated* Dosing weight:  65.8 *Administration dose not documented Date/Time  Rate/Dose/Volume Action  09/10/19 0856  *Not included in total MAR Hold    metoprolol tartrate (LOPRESSOR) tablet 12.5 mg (mg) Total dose:  Cannot be calculated* Dosing weight:  65.8 *Administration dose not documented Date/Time  Rate/Dose/Volume Action  09/10/19 0856  *Not included in total MAR Hold    nicotine (NICODERM CQ - dosed in mg/24 hours) patch 21 mg (mg) Total dose:  Cannot be calculated* Dosing weight:  59 *Administration dose not documented Date/Time  Rate/Dose/Volume Action  09/10/19 0856  *Not included in total MAR Hold    nitroGLYCERIN (NITROSTAT) SL tablet 0.4 mg (mg) Total dose:  Cannot be calculated* Dosing weight:  65.8 *Administration dose not documented Date/Time  Rate/Dose/Volume Action  09/10/19 0856  *Not included in total MAR Hold    ondansetron (ZOFRAN) injection 4 mg (mg) Total dose:  Cannot be calculated* Dosing weight:  65.8 *Administration dose not documented Date/Time  Rate/Dose/Volume Action  09/10/19 0856  *Not included in total MAR Hold    sodium chloride flush (NS) 0.9 % injection 3 mL (mL) Total dose:  Cannot be calculated* Dosing weight:  59 *Administration dose not documented Date/Time  Rate/Dose/Volume Action  09/10/19 0856  *Not included in total MAR Hold    Sedation Time  Sedation Time Physician-1: -53 minutes 37 seconds  Contrast  Medication Name Total Dose  iohexol (OMNIPAQUE) 300 MG/ML solution 50 mL    Radiation/Fluoro  Fluoro time: 4.6 (min) DAP: 17.1 (Gycm2) Cumulative Air Kerma: 258 (mGy)  Coronary Findings  Diagnostic Dominance: Right Left Anterior Descending  Mid LAD  lesion is 50% stenosed. The lesion is eccentric. The lesion is severely calcified.  Left Circumflex  Prox Cx lesion is 20% stenosed. The lesion is moderately calcified.  Right Coronary Artery  Prox RCA lesion is 90% stenosed. The lesion is moderately calcified.  Intervention  No interventions have been documented. Coronary Diagrams  Diagnostic Dominance: Right  Intervention  Implants   No implant documentation for this case.  Syngo Images  Show images for CARDIAC CATHETERIZATION Images on Long Term Storage  Show images for [place], Shepard to Procedure Log  Procedure Log    Hemo Data (last 2 days) before discharge   AO Systolic Cath Pressure  AO Diastolic Cath Pressure  AO Mean Cath Pressure  PA Systolic Cath Pressure  PA Diastolic Cath Pressure  PA Mean Cath Pressure  RA Wedge A Wave  RA Wedge V Wave  RV Systolic Cath Pressure  RV Diastolic Cath Pressure  RV End Diastolic  RV Systolic  RV End Diastolic  RV dP/dt  PCW A Wave  PCW V Wave  PCW Mean  AO O2 Sat  PA O2 Sat  AO O2 Sat  Fick C.O.  Fick C.I.   --  --  --  --  --  --  3 mmHg  -1 mmHg  --  --  --  33 mmHg  4 mmHg  480 mmHg/sec  13 mmHg  10 mmHg  8 mmHg  --  --  --  5.12 L/min  3.05 L/min/m2   --  --  --  --  --  --  --  --  33 mmHg  0 mmHg  4 mmHg  --  --  --  --  --  --  --  --  --  --  --   --  --  --  26 mmHg  9 mmHg  16 mmHg  --  --  --  --  --  --  --  --  --  --  --  --  --  --  --  --   112  69 mmHg  87 mmHg  --  --  --  --  --  --  --  --  --  --  --  --  --  --  --  --  --  --  --   --  --  --  --  --  --  --  --  --  --  --  --  --  --  --  --  --  99.3 %  --  SA  --  --   --  --  --  --  --  --  --  --  --                              Assessment/Plan:  This 58 year old gentleman has stage D, critical, symptomatic aortic stenosis with baseline New York Heart Association class 1 symptoms until he had an acute decompensation while performing heavy exertion associated with chest discomfort, shortness of  breath, nausea, dizziness and syncope.  His 2D echocardiogram shows critical aortic stenosis with a heavily calcified aortic valve with a mean gradient of 85 mmHg and a valve area by VTI of 0.42 cm.  It is not possible to tell if this is a bicuspid aortic valve but given his age it is highly likely.  Left ventricular ejection fraction is normal with moderate LVH and grade 1 diastolic dysfunction.  His cardiac catheterization shows a high-grade right coronary stenosis and a moderate 50% calcified mid LAD stenosis.  I agree that coronary bypass graft surgery and aortic valve replacement is indicated in this patient for relief of his symptoms and to prevent sudden death and left ventricular deterioration.  I reviewed the echocardiogram and cardiac catheterization findings with him and his daughter and answered their questions.  I discussed the alternatives of mechanical and bioprosthetic valves.  Since there was some question about a bicuspid aortic valve we will obtain a CTA of the chest to evaluate his aorta.  This was not visible on echocardiogram.  We will also obtain an orthopantogram to evaluate his teeth since he has not been to the dentist in a few years.  His teeth appear visibly in fair condition.  I told him that Dr. [name] will review his case and discuss surgery with him on Monday.  His surgery has tentatively been planned for Tuesday.  I spent 40 minutes performing this consultation and > 50% of this time was spent face to face counseling and coordinating the care of this patient's critical aortic stenosis and two-vessel coronary disease.  Gaye Pollack 09/11/2019, 3:45 PM

## 2019-09-11 NOTE — Progress Notes (Signed)
Cardiac surgery videos [phone] and #[phone] shown to patient and daughter.

## 2019-09-11 NOTE — Progress Notes (Addendum)
ANTICOAGULATION CONSULT NOTE -  Pharmacy Consult for Heparin Indication: CAD - for CABG + AVR  Allergies  Allergen Reactions  . Benadryl [Diphenhydramine]     Racing heart, syncope    Vital Signs: Temp: 98.4 F (36.9 C) (08/20 2151) Temp Source: Oral (08/20 2151) BP: 141/97 (08/20 2151) Pulse Rate: 86 (08/20 2151)  Labs: Recent Labs    09/08/19 1755 09/08/19 1755 09/09/19 0018 09/09/19 0120 09/09/19 0310 09/09/19 1323 09/09/19 1350 09/09/19 1350 09/09/19 2100 09/10/19 0653 09/10/19 2220 09/11/19 0218  HGB 14.9   < >  --  16.2  --   --   --   --   --   --   --  15.5  HCT 43.7  --   --  47.2  --   --   --   --   --   --   --  45.6  PLT 180  --   --  199  --   --   --   --   --   --   --  168  APTT  --   --   --  31  --   --   --   --   --   --  50*  --   LABPROT  --   --   --   --   --   --  14.0  --   --   --   --  13.7  INR  --   --   --   --   --   --  1.1  --   --   --   --  1.1  HEPARINUNFRC  --   --   --   --   --   --  <0.10*   < > 0.20* 0.50  --  0.29*  CREATININE 0.86  --   --   --   --  0.60*  --   --   --   --   --  0.71  CKTOTAL  --   --  100  --   --   --   --   --   --   --   --   --   TROPONINIHS 48*  --  132*  --  165*  --   --   --   --   --   --   --    < > = values in this interval not displayed.    Estimated Creatinine Clearance: 81 mL/min (by C-G formula based on SCr of 0.71 mg/dL).  Medications/Assessment: Heparin initiated for ACS.  No anticoagulants per PTA med list.  Trop 65 > 165. S/p cath, now planning CABG + AVR.  Heparin level is just below goal at 0.29 on 1200 units/hr. No problems with infusion per RN. No bleeding noted.  Goal of Therapy:  Heparin level 0.3-0.7 units/ml Monitor platelets by anticoagulation protocol: Yes   Plan:  Increase heparin drip to 1300 units/hr Daily heparin level, CBC Monitor for s/sx of bleeding  Thank you for involving pharmacy in this patient's care.  Renold Genta, PharmD, BCPS Clinical  Pharmacist 09/11/2019 3:43 AM  **Pharmacist phone directory can be found on [email or web address] listed under [facility]**

## 2019-09-12 ENCOUNTER — Inpatient Hospital Stay (HOSPITAL_COMMUNITY)

## 2019-09-12 LAB — COMPREHENSIVE METABOLIC PANEL
ALT: 22 U/L (ref 0–44)
AST: 18 U/L (ref 15–41)
Albumin: 3.7 g/dL (ref 3.5–5.0)
Alkaline Phosphatase: 52 U/L (ref 38–126)
Anion gap: 9 (ref 5–15)
BUN: 11 mg/dL (ref 6–20)
CO2: 24 mmol/L (ref 22–32)
Calcium: 9.2 mg/dL (ref 8.9–10.3)
Chloride: 104 mmol/L (ref 98–111)
Creatinine, Ser: 0.66 mg/dL (ref 0.61–1.24)
GFR calc Af Amer: >60 mL/min (ref >60)
GFR calc non Af Amer: >60 mL/min (ref >60)
Glucose, Bld: 107 mg/dL — ABNORMAL HIGH (ref 70–99)
Potassium: 4 mmol/L (ref 3.5–5.1)
Sodium: 137 mmol/L (ref 135–145)
Total Bilirubin: 1 mg/dL (ref 0.3–1.2)
Total Protein: 6.9 g/dL (ref 6.5–8.1)

## 2019-09-12 LAB — CBC
HCT: 44.9 % (ref 39.0–52.0)
Hemoglobin: 15.1 g/dL (ref 13.0–17.0)
MCH: 30.9 pg (ref 26.0–34.0)
MCHC: 33.6 g/dL (ref 30.0–36.0)
MCV: 92 fL (ref 80.0–100.0)
Platelets: 168 10*3/uL (ref 150–400)
RBC: 4.88 MIL/uL (ref 4.22–5.81)
RDW: 12.9 % (ref 11.5–15.5)
WBC: 11.3 10*3/uL — ABNORMAL HIGH (ref 4.0–10.5)
nRBC: 0 % (ref 0.0–0.2)

## 2019-09-12 LAB — HEPARIN LEVEL (UNFRACTIONATED): Heparin Unfractionated: 0.44 IU/mL (ref 0.30–0.70)

## 2019-09-12 MED ORDER — IOHEXOL 350 MG/ML SOLN
75.0000 mL | Freq: Once | INTRAVENOUS | Status: AC | PRN
  Administered 2019-09-12: 75 mL via INTRAVENOUS

## 2019-09-12 NOTE — Progress Notes (Signed)
[facility] for Heparin Indication: CAD - for CABG + AVR  Vital Signs: Temp: 98 F (36.7 C) (08/22 0455) Temp Source: Oral (08/22 0455) BP: 127/78 (08/22 0455) Pulse Rate: 72 (08/22 0455)  Labs: Recent Labs    09/09/19 1323 09/09/19 1350 09/09/19 2100 09/10/19 5631 09/10/19 2220 09/11/19 0218 09/12/19 0510 09/12/19 0756  HGB  --   --   --   --   --  15.5 15.1  --   HCT  --   --   --   --   --  45.6 44.9  --   PLT  --   --   --   --   --  168 168  --   APTT  --   --   --   --  50*  --   --   --   LABPROT  --  14.0  --   --   --  13.7  --   --   INR  --  1.1  --   --   --  1.1  --   --   HEPARINUNFRC  --  <0.10*   < > 0.50  --  0.29* 0.44  --   CREATININE 0.60*  --   --   --   --  0.71  --  0.66   < > = values in this interval not displayed.    Estimated Creatinine Clearance: 81 mL/min (by C-G formula based on SCr of 0.66 mg/dL).  Medications/Assessment: Heparin initiated for ACS.  No anticoagulants per PTA med list.  Trop 65 > 165. S/p cath, now planning CABG + AVR.  Heparin level is at goal this morning after rate increase last night to 1300 units/hr. No bleeding noted.  Goal of Therapy:  Heparin level 0.3-0.7 units/ml Monitor platelets by anticoagulation protocol: Yes   Plan:  Continue heparin 1300 units/hr Daily heparin level, CBC Monitor for s/sx of bleeding  Thank you for involving pharmacy in this patient's care.  Rebbeca Paul, PharmD PGY1 Pharmacy Resident 09/12/2019 9:02 AM  Please check [email or web address] for unit-specific pharmacy phone numbers.

## 2019-09-12 NOTE — Progress Notes (Signed)
Progress Note  Patient Name: Dean Obrien Date of Encounter: 09/12/2019  [facility] Cardiologist: Dean Rogue, MD   Subjective   Currently feeling well without chest pain or shortness of breath.  Was seen by cardiac surgery yesterday with plans for AVR one-vessel CABG tentatively on Tuesday.  Inpatient Medications    Scheduled Meds: . aspirin EC  81 mg Oral Daily  . atorvastatin  40 mg Oral Daily  . metoprolol tartrate  12.5 mg Oral BID  . nicotine  21 mg Transdermal Daily   Continuous Infusions: . heparin 1,300 Units/hr (09/11/19 1503)   PRN Meds: acetaminophen, nitroGLYCERIN, ondansetron (ZOFRAN) IV   Vital Signs    Vitals:   09/11/19 1108 09/11/19 1507 09/11/19 2056 09/12/19 0455  BP: 107/67 111/73 123/76 127/78  Pulse: 84 72 78 72  Resp:   19 17  Temp:  98.5 F (36.9 C) 98.1 F (36.7 C) 98 F (36.7 C)  TempSrc:  Oral Oral Oral  SpO2:  98% 97% 98%  Weight:    62.6 kg  Height:        Intake/Output Summary (Last 24 hours) at 09/12/2019 1027 Last data filed at 09/11/2019 2100 Gross per 24 hour  Intake 465.12 ml  Output 800 ml  Net -334.88 ml   Last 3 Weights 09/12/2019 09/11/2019 09/10/2019  Weight (lbs) 137 lb 15.8 oz 138 lb 8 oz 138 lb 8 oz  Weight (kg) 62.59 kg 62.823 kg 62.823 kg      Telemetry    Sinus rhythm- Personally Reviewed  ECG    Sinus rhythm- Personally Reviewed  Physical Exam   GEN: No acute distress.   Neck: No JVD Cardiac: RRR, 2 out of 6 to 3 out of 6 systolic murmur at the base, no rubs, or gallops.  Respiratory: Clear to auscultation bilaterally. GI: Soft, nontender, non-distended  MS: No edema; No deformity. Neuro:  Nonfocal  Psych: Normal affect   Labs    High Sensitivity Troponin:   Recent Labs  Lab 09/08/19 1755 09/09/19 0018 09/09/19 0310  TROPONINIHS 48* 132* 165*      Chemistry Recent Labs  Lab 09/08/19 1755 09/09/19 0120 09/09/19 1323 09/11/19 0218 09/12/19 0756  NA   < >  --  138 135 137    K   < >  --  3.8 3.7 4.0  CL   < >  --  104 102 104  CO2   < >  --  23 22 24   GLUCOSE   < >  --  117* 95 107*  BUN   < >  --  12 14 11   CREATININE   < >  --  0.60* 0.71 0.66  CALCIUM   < >  --  9.2 9.1 9.2  PROT  --  5.3*  --   --  6.9  ALBUMIN  --  3.0*  --   --  3.7  AST  --  16  --   --  18  ALT  --  15  --   --  22  ALKPHOS  --  37*  --   --  52  BILITOT  --  0.6  --   --  1.0  GFRNONAA   < >  --  >60 >60 >60  GFRAA   < >  --  >60 >60 >60  ANIONGAP   < >  --  11 11 9    < > = values in this interval not displayed.  Hematology Recent Labs  Lab 09/09/19 0120 09/11/19 0218 09/12/19 0510  WBC 14.9* 12.7* 11.3*  RBC 5.18 4.93 4.88  HGB 16.2 15.5 15.1  HCT 47.2 45.6 44.9  MCV 91.1 92.5 92.0  MCH 31.3 31.4 30.9  MCHC 34.3 34.0 33.6  RDW 13.2 12.9 12.9  PLT 199 168 168    BNPNo results for input(s): BNP, PROBNP in the last 168 hours.   DDimer No results for input(s): DDIMER in the last 168 hours.   Radiology    DG Orthopantogram  Result Date: 09/11/2019 CLINICAL DATA:  Aortic stenosis, preoperative evaluation EXAM: ORTHOPANTOGRAM/PANORAMIC COMPARISON:  None. FINDINGS: Panorex view of the mandible demonstrates no acute or destructive bony lesions. Good dentition. Sinuses are clear. IMPRESSION: 1. Unremarkable mandible. Electronically Signed   By: Randa Ngo M.D.   On: 09/11/2019 18:49    Cardiac Studies   LHC 09/10/19  Prox Cx lesion is 20% stenosed.  Mid LAD lesion is 50% stenosed.  Prox RCA lesion is 90% stenosed.  TTE 09/09/19 1. Left ventricular ejection fraction, by estimation, is 55 to 60%. The  left ventricle has normal function. The left ventricle has no regional  wall motion abnormalities. There is moderate left ventricular hypertrophy.  Left ventricular diastolic  parameters are consistent with Grade I diastolic dysfunction (impaired  relaxation).  2. Right ventricular systolic function is normal. The right ventricular  size is normal.  There is normal pulmonary artery systolic pressure. The  estimated right ventricular systolic pressure is 92.9 mmHg.  3. Mild mitral valve regurgitation.  4. The aortic valve was not well visualized. Appears heavily calcified.  Aortic valve regurgitation is not visualized. Critical aortic valve  stenosis. Aortic valve area, by VTI measures 0.42 cm. Aortic valve mean  gradient measures 84.7 mmHg. Aortic  valve Vmax measures 5.51 m/s.   Patient Profile     59 y.o. male with a history of severe aortic stenosis, coronary artery disease, syncope, hypertension, hyperlipidemia, prediabetes, and tobacco abuse presented to [facility] and now transferred to [facility] for evaluation of AVR and single-vessel CABG.  Assessment & Plan    1.  Severe aortic stenosis: Was seen by cardiac surgery.  Plan for AVR single-vessel CABG on Tuesday.  No chest pain or shortness of breath.  2.  Coronary artery disease with non-STEMI: Left heart catheterization showed a 90% RCA lesion.  Currently chest pain-free on heparin.  We Dean Obrien plan for single-vessel CABG on Tuesday.  3.  Syncope: Occurred 09/09/2019.  Could be a complication of severe AS.  4.  Hyperlipidemia: Continue atorvastatin 40 mg.  5.  Tobacco abuse: Needs complete cessation.  For questions or updates, please contact Dean Obrien Please consult [email or web address] for contact info under        Signed, Dean Arenz Meredith Leeds, MD  09/12/2019, 10:27 AM

## 2019-09-12 NOTE — Progress Notes (Signed)
TCTS Subjective:  No chest pain, shortness of breath or dizziness.  Objective: Vital signs in last 24 hours: Temp:  [98 F (36.7 C)-98.5 F (36.9 C)] 98 F (36.7 C) (08/22 0455) Pulse Rate:  [72-78] 75 (08/22 1029) Cardiac Rhythm: Normal sinus rhythm (08/22 0701) Resp:  [17-19] 17 (08/22 0455) BP: (111-127)/(72-78) 114/72 (08/22 1029) SpO2:  [97 %-98 %] 98 % (08/22 0455) Weight:  [62.6 kg] 62.6 kg (08/22 0455)  Hemodynamic parameters for last 24 hours:    Intake/Output from previous day: 08/21 0701 - 08/22 0700 In: 765.1 [P.O.:540; I.V.:225.1] Out: 800 [Urine:800] Intake/Output this shift: No intake/output data recorded.  General appearance: alert and cooperative Heart: regular rate and rhythm, S1, S2 normal, systolic murmur Lungs: clear to auscultation bilaterally  Lab Results: Recent Labs    09/11/19 0218 09/12/19 0510  WBC 12.7* 11.3*  HGB 15.5 15.1  HCT 45.6 44.9  PLT 168 168   BMET:  Recent Labs    09/11/19 0218 09/12/19 0756  NA 135 137  K 3.7 4.0  CL 102 104  CO2 22 24  GLUCOSE 95 107*  BUN 14 11  CREATININE 0.71 0.66  CALCIUM 9.1 9.2    PT/INR:  Recent Labs    09/11/19 0218  LABPROT 13.7  INR 1.1   ABG No results found for: PHART, HCO3, TCO2, ACIDBASEDEF, O2SAT CBG (last 3)  Recent Labs    09/10/19 2159  GLUCAP 83   CLINICAL DATA:  Aortic stenosis, preoperative evaluation  EXAM: ORTHOPANTOGRAM/PANORAMIC  COMPARISON:  None.  FINDINGS: Panorex view of the mandible demonstrates no acute or destructive bony lesions. Good dentition. Sinuses are clear.  IMPRESSION: 1. Unremarkable mandible.   Electronically Signed   By: Randa Ngo M.D.   On: 09/11/2019 18:49  CLINICAL DATA:  Aortic disease.  Aortic valve stenosis.  EXAM: CT ANGIOGRAPHY CHEST WITH CONTRAST  TECHNIQUE: Multidetector CT imaging of the chest was performed using the standard protocol during bolus administration of intravenous contrast.  Multiplanar CT image reconstructions and MIPs were obtained to evaluate the vascular anatomy.  CONTRAST:  55mL OMNIPAQUE IOHEXOL 350 MG/ML SOLN  COMPARISON:  None.  FINDINGS: Cardiovascular: Atherosclerotic changes are seen in the thoracic and upper abdominal aorta. The ascending thoracic aorta measures up to 3.7 cm on series 7, image 199. The proximal aortic arch measures 3.4 cm in dimension on series 5, image 50. The heart size is normal. The main pulmonary artery is normal in caliber. No pulmonary emboli identified.  Mediastinum/Nodes: No enlarged mediastinal, hilar, or axillary lymph nodes. Thyroid gland, trachea, and esophagus demonstrate no significant findings.  Lungs/Pleura: There is a 2.5 cm bulla in the medial left apex. There is a nodule in the left apex best seen on axial image 26 of series 6 and sagittal image 110. This nodule measures 8 by 6 x 6 mm with a mean diameter of 7 mm. There is a small nodule in the left base on series 6, image 120 measuring up to 3 mm. There is a nodule in the anterior right lung on series 6, image 102 measuring up to 4 5 mm. A tiny nodule seen in the posterior right lower lobe on series 6, image 111. No suspicious infiltrates. There is a calcified nodule in the medial right upper lobe on series 6, image 41 of no significance.  Upper Abdomen: No acute abnormality.  Musculoskeletal: No chest wall abnormality. No acute or significant osseous findings.  Review of the MIP images confirms the above findings.  IMPRESSION: 1. No pulmonary emboli. 2. Atherosclerosis in the thoracic and upper abdominal aorta. The ascending thoracic aorta is nonaneurysmal. The proximal aortic arch measures 3.4 cm which is mildly aneurysmal. Recommend annual imaging followup by CTA or MRA. This recommendation follows 2010 ACCF/AHA/AATS/ACR/ASA/SCA/SCAI/SIR/STS/SVM Guidelines for the Diagnosis and Management of Patients with Thoracic Aortic  Disease. Circulation.2010; 121: U889-V694. Aortic aneurysm NOS (ICD10-I71.9) 3. Pulmonary nodules as described above. The largest is seen in the left apex with a mean diameter 7 mm. Non-contrast chest CT at 6-12 months is recommended. If the nodule is stable at time of repeat CT, then future CT at 18-24 months (from today's scan) is considered optional for low-risk patients, but is recommended for high-risk patients. This recommendation follows the consensus statement: Guidelines for Management of Incidental Pulmonary Nodules Detected on CT Images: From the Fleischner Society 2017; Radiology 2017; 284:228-243. 4. No other abnormalities.  Aortic Atherosclerosis (ICD10-I70.0). Aortic aneurysm NOS (ICD10-I71.9).   Electronically Signed   By: Dorise Bullion III M.D   On: 09/12/2019 12:05   Assessment/Plan:  Critical AS  Orthopantogram shows no dental problems at this time.  CTA reviewed and shows probable bicuspid AV. The ascending aorta is mildly enlarged at 3.7 cm tapering to 3.4 cm in arch. Does not require replacement. There is also a 7 mm nodule in LUL that will require a followup CT in 6 months in this heavy smoker. I discussed the results with the patient and daughter and answered their questions. Dr. [name] will see tomorrow for surgery Tuesday.  LOS: 2 days    Gaye Pollack 09/12/2019

## 2019-09-13 ENCOUNTER — Inpatient Hospital Stay (HOSPITAL_COMMUNITY)

## 2019-09-13 DIAGNOSIS — Z01810 Encounter for preprocedural cardiovascular examination: Secondary | ICD-10-CM

## 2019-09-13 LAB — CBC
HCT: 43.2 % (ref 39.0–52.0)
Hemoglobin: 14.3 g/dL (ref 13.0–17.0)
MCH: 30.8 pg (ref 26.0–34.0)
MCHC: 33.1 g/dL (ref 30.0–36.0)
MCV: 92.9 fL (ref 80.0–100.0)
Platelets: 158 10*3/uL (ref 150–400)
RBC: 4.65 MIL/uL (ref 4.22–5.81)
RDW: 13.1 % (ref 11.5–15.5)
WBC: 10.9 10*3/uL — ABNORMAL HIGH (ref 4.0–10.5)
nRBC: 0 % (ref 0.0–0.2)

## 2019-09-13 LAB — BLOOD GAS, ARTERIAL
Acid-Base Excess: 0.7 mmol/L (ref 0.0–2.0)
Bicarbonate: 24.8 mmol/L (ref 20.0–28.0)
Drawn by: 28099
FIO2: 21
O2 Saturation: 96.8 %
Patient temperature: 37.1
pCO2 arterial: 39.5 mmHg (ref 32.0–48.0)
pH, Arterial: 7.414 (ref 7.350–7.450)
pO2, Arterial: 85 mmHg (ref 83.0–108.0)

## 2019-09-13 LAB — URINALYSIS, ROUTINE W REFLEX MICROSCOPIC
Bacteria, UA: NONE SEEN
Bilirubin Urine: NEGATIVE
Glucose, UA: NEGATIVE mg/dL
Ketones, ur: NEGATIVE mg/dL
Leukocytes,Ua: NEGATIVE
Nitrite: NEGATIVE
Protein, ur: NEGATIVE mg/dL
Specific Gravity, Urine: 1.011 (ref 1.005–1.030)
pH: 7 (ref 5.0–8.0)

## 2019-09-13 LAB — PULMONARY FUNCTION TEST
FEF 25-75 Pre: 2.14 L/sec
FEF2575-%Pred-Pre: 87 %
FEV1-%Pred-Pre: 91 %
FEV1-Pre: 2.58 L
FEV1FVC-%Pred-Pre: 99 %
FEV6-%Pred-Pre: 93 %
FEV6-Pre: 3.3 L
FEV6FVC-%Pred-Pre: 102 %
FVC-%Pred-Pre: 91 %
FVC-Pre: 3.4 L
Pre FEV1/FVC ratio: 76 %
Pre FEV6/FVC Ratio: 98 %

## 2019-09-13 LAB — SURGICAL PCR SCREEN
MRSA, PCR: NEGATIVE
Staphylococcus aureus: POSITIVE — AB

## 2019-09-13 LAB — APTT: aPTT: 109 seconds — ABNORMAL HIGH (ref 24–36)

## 2019-09-13 LAB — PROTIME-INR
INR: 1.1 (ref 0.8–1.2)
Prothrombin Time: 14.2 seconds (ref 11.4–15.2)

## 2019-09-13 LAB — TYPE AND SCREEN
ABO/RH(D): A POS
Antibody Screen: NEGATIVE

## 2019-09-13 LAB — HEPARIN LEVEL (UNFRACTIONATED): Heparin Unfractionated: 0.55 IU/mL (ref 0.30–0.70)

## 2019-09-13 LAB — ABO/RH: ABO/RH(D): A POS

## 2019-09-13 MED ORDER — MILRINONE LACTATE IN DEXTROSE 20-5 MG/100ML-% IV SOLN
0.3000 ug/kg/min | INTRAVENOUS | Status: DC
  Filled 2019-09-13: qty 100

## 2019-09-13 MED ORDER — SODIUM CHLORIDE 0.9 % IV SOLN
750.0000 mg | INTRAVENOUS | Status: AC
  Administered 2019-09-14: 750 mg via INTRAVENOUS
  Filled 2019-09-13: qty 750

## 2019-09-13 MED ORDER — DEXMEDETOMIDINE HCL IN NACL 400 MCG/100ML IV SOLN
0.1000 ug/kg/h | INTRAVENOUS | Status: AC
  Administered 2019-09-14: .3 ug/kg/h via INTRAVENOUS
  Filled 2019-09-13: qty 100

## 2019-09-13 MED ORDER — VANCOMYCIN HCL 1250 MG/250ML IV SOLN
1250.0000 mg | INTRAVENOUS | Status: AC
  Administered 2019-09-14: 1250 mg via INTRAVENOUS
  Filled 2019-09-13: qty 250

## 2019-09-13 MED ORDER — TRANEXAMIC ACID (OHS) BOLUS VIA INFUSION
15.0000 mg/kg | INTRAVENOUS | Status: AC
  Administered 2019-09-14: 937.5 mg via INTRAVENOUS
  Filled 2019-09-13: qty 938

## 2019-09-13 MED ORDER — SODIUM CHLORIDE 0.9 % IV SOLN
INTRAVENOUS | Status: DC
  Filled 2019-09-13: qty 30

## 2019-09-13 MED ORDER — METOPROLOL TARTRATE 12.5 MG HALF TABLET
12.5000 mg | ORAL_TABLET | Freq: Once | ORAL | Status: AC
  Filled 2019-09-13: qty 1

## 2019-09-13 MED ORDER — NITROGLYCERIN IN D5W 200-5 MCG/ML-% IV SOLN
2.0000 ug/min | INTRAVENOUS | Status: DC
  Filled 2019-09-13: qty 250

## 2019-09-13 MED ORDER — BISACODYL 5 MG PO TBEC
5.0000 mg | DELAYED_RELEASE_TABLET | Freq: Once | ORAL | Status: AC
  Administered 2019-09-13: 5 mg via ORAL
  Filled 2019-09-13: qty 1

## 2019-09-13 MED ORDER — PLASMA-LYTE 148 IV SOLN
INTRAVENOUS | Status: DC
  Filled 2019-09-13: qty 2.5

## 2019-09-13 MED ORDER — TRANEXAMIC ACID 1000 MG/10ML IV SOLN
1.5000 mg/kg/h | INTRAVENOUS | Status: AC
  Administered 2019-09-14: 1.5 mg/kg/h via INTRAVENOUS
  Filled 2019-09-13: qty 25

## 2019-09-13 MED ORDER — CHLORHEXIDINE GLUCONATE 0.12 % MT SOLN
15.0000 mL | Freq: Once | OROMUCOSAL | Status: AC
  Administered 2019-09-14: 15 mL via OROMUCOSAL
  Filled 2019-09-13: qty 15

## 2019-09-13 MED ORDER — NOREPINEPHRINE 4 MG/250ML-% IV SOLN
0.0000 ug/min | INTRAVENOUS | Status: DC
  Filled 2019-09-13: qty 250

## 2019-09-13 MED ORDER — EPINEPHRINE HCL 5 MG/250ML IV SOLN IN NS
0.0000 ug/min | INTRAVENOUS | Status: DC
  Filled 2019-09-13: qty 250

## 2019-09-13 MED ORDER — TRANEXAMIC ACID (OHS) PUMP PRIME SOLUTION
2.0000 mg/kg | INTRAVENOUS | Status: DC
  Filled 2019-09-13: qty 1.25

## 2019-09-13 MED ORDER — POTASSIUM CHLORIDE 2 MEQ/ML IV SOLN
80.0000 meq | INTRAVENOUS | Status: DC
  Filled 2019-09-13: qty 40

## 2019-09-13 MED ORDER — CHLORHEXIDINE GLUCONATE CLOTH 2 % EX PADS
6.0000 | MEDICATED_PAD | Freq: Once | CUTANEOUS | Status: AC
  Administered 2019-09-14: 6 via TOPICAL

## 2019-09-13 MED ORDER — SODIUM CHLORIDE 0.9 % IV SOLN
1.5000 g | INTRAVENOUS | Status: AC
  Administered 2019-09-14: 1.5 g via INTRAVENOUS
  Filled 2019-09-13: qty 1.5

## 2019-09-13 MED ORDER — ~~LOC~~ CARDIAC SURGERY, PATIENT & FAMILY EDUCATION
Freq: Once | Status: DC
  Filled 2019-09-13 (×2): qty 1

## 2019-09-13 MED ORDER — CHLORHEXIDINE GLUCONATE CLOTH 2 % EX PADS: 6.0000 | MEDICATED_PAD | Freq: Once | CUTANEOUS | Status: DC

## 2019-09-13 MED ORDER — TEMAZEPAM 15 MG PO CAPS
15.0000 mg | ORAL_CAPSULE | Freq: Once | ORAL | Status: AC | PRN
  Administered 2019-09-13: 15 mg via ORAL
  Filled 2019-09-13: qty 1

## 2019-09-13 MED ORDER — MUPIROCIN 2 % EX OINT
1.0000 "application " | TOPICAL_OINTMENT | Freq: Two times a day (BID) | CUTANEOUS | Status: AC
  Administered 2019-09-13 – 2019-09-18 (×9): 1 via NASAL
  Filled 2019-09-13 (×3): qty 22

## 2019-09-13 MED ORDER — CHLORHEXIDINE GLUCONATE CLOTH 2 % EX PADS
6.0000 | MEDICATED_PAD | Freq: Once | CUTANEOUS | Status: AC
  Administered 2019-09-13: 6 via TOPICAL

## 2019-09-13 MED ORDER — PHENYLEPHRINE HCL-NACL 20-0.9 MG/250ML-% IV SOLN
30.0000 ug/min | INTRAVENOUS | Status: AC
  Administered 2019-09-14: 50 ug/min via INTRAVENOUS
  Filled 2019-09-13: qty 250

## 2019-09-13 MED ORDER — CHLORHEXIDINE GLUCONATE CLOTH 2 % EX PADS
6.0000 | MEDICATED_PAD | Freq: Every day | CUTANEOUS | Status: DC
  Administered 2019-09-14: 6 via TOPICAL

## 2019-09-13 MED ORDER — MAGNESIUM SULFATE 50 % IJ SOLN
40.0000 meq | INTRAMUSCULAR | Status: DC
  Filled 2019-09-13: qty 9.85

## 2019-09-13 MED ORDER — INSULIN REGULAR(HUMAN) IN NACL 100-0.9 UT/100ML-% IV SOLN
INTRAVENOUS | Status: AC
  Administered 2019-09-14: .7 [IU]/h via INTRAVENOUS
  Filled 2019-09-13: qty 100

## 2019-09-13 NOTE — Hospital Course (Addendum)
HPI: The patient is a 59 year old gentleman with history of hypertension, hyperlipidemia, prediabetes, and a long history of heavy tobacco abuse who currently smokes about 1/2 pack of cigarettes per day who has a history of a systolic heart murmur that was felt to be due to aortic valve disease but the patient never had an echocardiogram due to being out of the insurance network for a physician he was seeing.  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  On 09/08/2019 he was outside in his yard vigorously cracking a chainsaw when he suddenly became very dizzy and acutely short of breath and nauseous.  He had some transient chest discomfort that he described as a burning tightness and immediately went inside where he was very short of breath.  He got down on his hands and knees on the ground and believes he lost consciousness.  His daughter is with him today and said that he passed out for about 1 to 2 minutes.  He was taken to [facility] where electrocardiogram showed no acute ST-T changes.  He had mildly elevated high-sensitivity troponin at 48>132>165.  Head CT was unremarkable.  He was seen for cardiology consultation and underwent a echocardiogram showing critical aortic stenosis with a peak velocity of 5.5 m/s.  Aortic valve mean gradient was 85.  Left ventricular ejection fraction was 55 to 60%.  He underwent cardiac catheterization which showed 90% proximal RCA stenosis.  The LAD had a 50% mid vessel calcified stenosis over a couple centimeters.  The left circumflex had minimal proximal narrowing.  He was transferred to [facility] for surgical consultation.   There is no family history of aortic valve disease, aortic aneurysm, or connective tissue disorder.  The patient is here today with his daughter.  He still smokes about 1/2 pack of cigarettes per day.  He said that he has not been to the dentist in  a couple years but has had several extractions about teeth in the past.    Orthopantogram showed no dental problems. CTA reviewed and shows probable bicuspid AV. The ascending aorta is mildly enlarged at 3.7 cm tapering to 3.4 cm in arch. Does not require replacement. There is also a 7 mm nodule in LUL that will require a followup CT in 6 months in this heavy smoker.  Hospital Course: He was extubated the evening of surgery. He remained afebrile and hemodynamically stable. Gordy Councilman, a line and foley were removed early in the post operative course. He was weaned off Neo Syneprhine drip. He did have nausea which was resolved with Zofran PRN. Chest tubes remained for a few days then were removed as output decreased. He was started on low dose Lopressor. He was started on Plavix 75 mg daily and baby ec asa as he was s/p NSTEMI. He was volume overloaded and diuresed. He was weaned off the Insulin drip. His pre op HGA1C was 5.8. He will need further surveillance by his medical doctor after discharge. PT and OT consults were obtained. He was felt surgically stable for transfer from the ICU to progressive care floor on 08/27. He went into a fib on 08/27 and was put on an Amiodarone drip. Lopressor was increased to 12.5 mg tid. He converted to sinus rhythm and remained in SR. Epicardial pacing wires were removed on 08/28. He was severely hypokalemic at 2.7 on 08/28. Supplementation was ordered accordingly. Repeat potassium later that day  was **. He had expected post op blood loss anemia. His last H and H was ***. Echo has been arranged after discharged and is scheduled for 11/02/2019 at 9:30 am

## 2019-09-13 NOTE — Progress Notes (Signed)
RT attempted ABG several times with no success. Patient has PFT scheduled. Will see if RT can attempt ABG again.

## 2019-09-13 NOTE — Progress Notes (Signed)
[facility] for Heparin Indication: CAD - for CABG + AVR  Vital Signs: Temp: 98.2 F (36.8 C) (08/23 0516) Temp Source: Oral (08/23 0516) BP: 134/79 (08/23 0827) Pulse Rate: 75 (08/23 0516)  Labs: Recent Labs    09/10/19 2220 09/11/19 0218 09/11/19 0218 09/12/19 0510 09/12/19 0756 09/13/19 0603 09/13/19 1154  HGB  --  15.5   < > 15.1  --  14.3  --   HCT  --  45.6  --  44.9  --  43.2  --   PLT  --  168  --  168  --  158  --   APTT 50*  --   --   --   --   --  109*  LABPROT  --  13.7  --   --   --   --  14.2  INR  --  1.1  --   --   --   --  1.1  HEPARINUNFRC  --  0.29*  --  0.44  --  0.55  --   CREATININE  --  0.71  --   --  0.66  --   --    < > = values in this interval not displayed.    Estimated Creatinine Clearance: 81 mL/min (by C-G formula based on SCr of 0.66 mg/dL).  Medications/Assessment: Heparin initiated for ACS.  No anticoagulants per PTA med list.  Trop 65 > 165. S/p cath, now planning CABG + AVR.  Heparin level 0.55 is at goal on heparin drip rate 1300 uts/hr . No bleeding noted. CBC stable f/u post CABG 8/24  Goal of Therapy:  Heparin level 0.3-0.7 units/ml Monitor platelets by anticoagulation protocol: Yes   Plan:  Continue heparin 1300 units/hr Daily heparin level, CBC Monitor for s/sx of bleeding  Bonnita Nasuti Pharm.D. CPP, BCPS Clinical Pharmacist [phone] 09/13/2019 2:52 PM    Please check [email or web address] for unit-specific pharmacy phone numbers.

## 2019-09-13 NOTE — Progress Notes (Addendum)
Discussed sternal precautions, IS (2100 mL), mobility post op, and d/c planning with pt. Receptive, appropriate questions. His wife is paralyzed with MS but his children are helping until he recovers. They will be with him at d/c. Gave materials, he had already viewed videos. Pt moves well, will be eager to ambulate post op. [place], ACSM 3:08 PM 09/13/2019

## 2019-09-13 NOTE — Anesthesia Preprocedure Evaluation (Addendum)
Anesthesia Evaluation  Patient identified by MRN, date of birth, ID band Patient awake    Reviewed: Allergy & Precautions, NPO status , Patient's Chart, lab work & pertinent test results, reviewed documented beta blocker date and time   History of Anesthesia Complications Negative for: history of anesthetic complications  Airway Mallampati: II  TM Distance: >3 FB Neck ROM: Full    Dental  (+) Dental Advisory Given   Pulmonary COPD, Current Smoker and Patient abstained from smoking.,  09/09/2019 SARS coronavirus NEG   breath sounds clear to auscultation       Cardiovascular hypertension, Pt. on medications and Pt. on home beta blockers (-) angina+ CAD (50% LAD, 20% Cx, 90% RCA) and + Past MI  + Valvular Problems/Murmurs (critical AS) AS  Rhythm:Regular Rate:Normal + Systolic murmurs 0/09/2328 ECHO: EF 55-60%, mod LVH, Grade 1 DD, mild MR, critical AS with mead gradient 84.7 mmHg, peak grad 121.6 mmHg, AVA 0.42 cm2   Neuro/Psych negative neurological ROS     GI/Hepatic negative GI ROS, Neg liver ROS,   Endo/Other  negative endocrine ROS  Renal/GU negative Renal ROS     Musculoskeletal   Abdominal   Peds  Hematology negative hematology ROS (+)   Anesthesia Other Findings   Reproductive/Obstetrics                           Anesthesia Physical Anesthesia Plan  ASA: IV  Anesthesia Plan: General   Post-op Pain Management:    Induction: Intravenous  PONV Risk Score and Plan: 1 and Treatment may vary due to age or medical condition  Airway Management Planned: Oral ETT  Additional Equipment: Arterial line, PA Cath, TEE and Ultrasound Guidance Line Placement  Intra-op Plan:   Post-operative Plan: Post-operative intubation/ventilation  Informed Consent: I have reviewed the patients History and Physical, chart, labs and discussed the procedure including the risks, benefits and alternatives  for the proposed anesthesia with the patient or authorized representative who has indicated his/her understanding and acceptance.     Dental advisory given  Plan Discussed with: CRNA and Surgeon  Anesthesia Plan Comments:        Anesthesia Quick Evaluation

## 2019-09-13 NOTE — Progress Notes (Signed)
Progress Note  Patient Name: Dean Obrien Date of Encounter: 09/13/2019  [facility] Cardiologist: Ida Rogue, MD   Subjective   Pt denies CP  Breathing is OK     Inpatient Medications    Scheduled Meds: . aspirin EC  81 mg Oral Daily  . atorvastatin  40 mg Oral Daily  . [facility], Patient & Family Education   Does not apply Once  . metoprolol tartrate  12.5 mg Oral BID  . nicotine  21 mg Transdermal Daily   Continuous Infusions: . heparin 1,300 Units/hr (09/12/19 1429)   PRN Meds: acetaminophen, nitroGLYCERIN, ondansetron (ZOFRAN) IV   Vital Signs    Vitals:   09/12/19 1029 09/12/19 1833 09/12/19 2048 09/13/19 0516  BP: 114/72 111/66 130/81 126/77  Pulse: 75 78 74 75  Resp:  18 17 16   Temp:  98.4 F (36.9 C) 98.5 F (36.9 C) 98.2 F (36.8 C)  TempSrc:  Oral Oral Oral  SpO2:  98% 97% 98%  Weight:    62.5 kg  Height:        Intake/Output Summary (Last 24 hours) at 09/13/2019 0719 Last data filed at 09/13/2019 0414 Gross per 24 hour  Intake 1320.03 ml  Output 900 ml  Net 420.03 ml   Last 3 Weights 09/13/2019 09/12/2019 09/11/2019  Weight (lbs) 137 lb 13.1 oz 137 lb 15.8 oz 138 lb 8 oz  Weight (kg) 62.513 kg 62.59 kg 62.823 kg      Telemetry    SR - Personally Reviewed  ECG  = No new- Personally Reviewed  Physical Exam   GEN: No acute distress.   Neck: No JVD Cardiac: RRR,3 out of 6 systolic murmur LSB to base Respiratory: Clear to auscultation bilaterally. GI: Soft, nontender, non-distended  MS: No edema; No deformity. Neuro:  Nonfocal  Psych: Normal affect   Labs    High Sensitivity Troponin:   Recent Labs  Lab 09/08/19 1755 09/09/19 0018 09/09/19 0310  TROPONINIHS 48* 132* 165*      Chemistry Recent Labs  Lab 09/08/19 1755 09/09/19 0120 09/09/19 1323 09/11/19 0218 09/12/19 0756  NA   < >  --  138 135 137  K   < >  --  3.8 3.7 4.0  CL   < >  --  104 102 104  CO2   < >  --  23 22 24   GLUCOSE   <  >  --  117* 95 107*  BUN   < >  --  12 14 11   CREATININE   < >  --  0.60* 0.71 0.66  CALCIUM   < >  --  9.2 9.1 9.2  PROT  --  5.3*  --   --  6.9  ALBUMIN  --  3.0*  --   --  3.7  AST  --  16  --   --  18  ALT  --  15  --   --  22  ALKPHOS  --  37*  --   --  52  BILITOT  --  0.6  --   --  1.0  GFRNONAA   < >  --  >60 >60 >60  GFRAA   < >  --  >60 >60 >60  ANIONGAP   < >  --  11 11 9    < > = values in this interval not displayed.     Hematology Recent Labs  Lab 09/11/19 [phone] 09/12/19 0510 09/13/19  0603  WBC 12.7* 11.3* 10.9*  RBC 4.93 4.88 4.65  HGB 15.5 15.1 14.3  HCT 45.6 44.9 43.2  MCV 92.5 92.0 92.9  MCH 31.4 30.9 30.8  MCHC 34.0 33.6 33.1  RDW 12.9 12.9 13.1  PLT 168 168 158    BNPNo results for input(s): BNP, PROBNP in the last 168 hours.   DDimer No results for input(s): DDIMER in the last 168 hours.   Radiology    DG Orthopantogram  Result Date: 09/11/2019 CLINICAL DATA:  Aortic stenosis, preoperative evaluation EXAM: ORTHOPANTOGRAM/PANORAMIC COMPARISON:  None. FINDINGS: Panorex view of the mandible demonstrates no acute or destructive bony lesions. Good dentition. Sinuses are clear. IMPRESSION: 1. Unremarkable mandible. Electronically Signed   By: Randa Ngo M.D.   On: 09/11/2019 18:49   CT ANGIO CHEST AORTA W/CM & OR WO/CM  Result Date: 09/12/2019 CLINICAL DATA:  Aortic disease.  Aortic valve stenosis. EXAM: CT ANGIOGRAPHY CHEST WITH CONTRAST TECHNIQUE: Multidetector CT imaging of the chest was performed using the standard protocol during bolus administration of intravenous contrast. Multiplanar CT image reconstructions and MIPs were obtained to evaluate the vascular anatomy. CONTRAST:  51mL OMNIPAQUE IOHEXOL 350 MG/ML SOLN COMPARISON:  None. FINDINGS: Cardiovascular: Atherosclerotic changes are seen in the thoracic and upper abdominal aorta. The ascending thoracic aorta measures up to 3.7 cm on series 7, image 199. The proximal aortic arch measures 3.4 cm in  dimension on series 5, image 50. The heart size is normal. The main pulmonary artery is normal in caliber. No pulmonary emboli identified. Mediastinum/Nodes: No enlarged mediastinal, hilar, or axillary lymph nodes. Thyroid gland, trachea, and esophagus demonstrate no significant findings. Lungs/Pleura: There is a 2.5 cm bulla in the medial left apex. There is a nodule in the left apex best seen on axial image 26 of series 6 and sagittal image 110. This nodule measures 8 by 6 x 6 mm with a mean diameter of 7 mm. There is a small nodule in the left base on series 6, image 120 measuring up to 3 mm. There is a nodule in the anterior right lung on series 6, image 102 measuring up to 4 5 mm. A tiny nodule seen in the posterior right lower lobe on series 6, image 111. No suspicious infiltrates. There is a calcified nodule in the medial right upper lobe on series 6, image 41 of no significance. Upper Abdomen: No acute abnormality. Musculoskeletal: No chest wall abnormality. No acute or significant osseous findings. Review of the MIP images confirms the above findings. IMPRESSION: 1. No pulmonary emboli. 2. Atherosclerosis in the thoracic and upper abdominal aorta. The ascending thoracic aorta is nonaneurysmal. The proximal aortic arch measures 3.4 cm which is mildly aneurysmal. Recommend annual imaging followup by CTA or MRA. This recommendation follows 2010 ACCF/AHA/AATS/ACR/ASA/SCA/SCAI/SIR/STS/SVM Guidelines for the Diagnosis and Management of Patients with Thoracic Aortic Disease. Circulation.2010; 121: J941-D408. Aortic aneurysm NOS (ICD10-I71.9) 3. Pulmonary nodules as described above. The largest is seen in the left apex with a mean diameter 7 mm. Non-contrast chest CT at 6-12 months is recommended. If the nodule is stable at time of repeat CT, then future CT at 18-24 months (from today's scan) is considered optional for low-risk patients, but is recommended for high-risk patients. This recommendation follows the  consensus statement: Guidelines for Management of Incidental Pulmonary Nodules Detected on CT Images: From the Fleischner Society 2017; Radiology 2017; 284:228-243. 4. No other abnormalities. Aortic Atherosclerosis (ICD10-I70.0). Aortic aneurysm NOS (ICD10-I71.9). Electronically Signed   By: Dorise Bullion III  M.D   On: 09/12/2019 12:05    Cardiac Studies   LHC 09/10/19  Prox Cx lesion is 20% stenosed.  Mid LAD lesion is 50% stenosed.  Prox RCA lesion is 90% stenosed.  TTE 09/09/19 1. Left ventricular ejection fraction, by estimation, is 55 to 60%. The  left ventricle has normal function. The left ventricle has no regional  wall motion abnormalities. There is moderate left ventricular hypertrophy.  Left ventricular diastolic  parameters are consistent with Grade I diastolic dysfunction (impaired  relaxation).  2. Right ventricular systolic function is normal. The right ventricular  size is normal. There is normal pulmonary artery systolic pressure. The  estimated right ventricular systolic pressure is 93.1 mmHg.  3. Mild mitral valve regurgitation.  4. The aortic valve was not well visualized. Appears heavily calcified.  Aortic valve regurgitation is not visualized. Critical aortic valve  stenosis. Aortic valve area, by VTI measures 0.42 cm. Aortic valve mean  gradient measures 84.7 mmHg. Aortic  valve Vmax measures 5.51 m/s.   Patient Profile     59 y.o. male with a history of severe aortic stenosis, coronary artery disease, syncope, hypertension, hyperlipidemia, prediabetes, and tobacco abuse presented to [facility] and now transferred to [facility] for evaluation of AVR and single-vessel CABG.  Assessment & Plan    1.  Severe aortic stenosis:Plan for AVR this week   2.  Coronary artery disease with non-STEMI: Left heart catheterization as noted above Plan for CABG this week  Continue heparin  3.  Syncope: Occurred 09/09/2019.   4.  Hyperlipidemia: Continue atorvastatin 40  mg.  5.  Tobacco abuse: Counsel on quitting   For questions or updates, please contact [place] Please consult [email or web address] for contact info under        Signed, Dorris Carnes, MD  09/13/2019, 7:19 AM

## 2019-09-14 ENCOUNTER — Inpatient Hospital Stay (HOSPITAL_COMMUNITY): Admission: RE | Admit: 2019-09-14 | Source: Home / Self Care | Admitting: Cardiothoracic Surgery

## 2019-09-14 ENCOUNTER — Inpatient Hospital Stay (HOSPITAL_COMMUNITY)

## 2019-09-14 ENCOUNTER — Inpatient Hospital Stay (HOSPITAL_COMMUNITY): Admitting: Anesthesiology

## 2019-09-14 ENCOUNTER — Inpatient Hospital Stay (HOSPITAL_COMMUNITY)
Admission: AD | Disposition: A | Discharge status: Home / Self Care | Payer: Self-pay | Source: Other Acute Inpatient Hospital | Attending: Cardiothoracic Surgery

## 2019-09-14 DIAGNOSIS — I2510 Atherosclerotic heart disease of native coronary artery without angina pectoris: Secondary | ICD-10-CM | POA: Diagnosis present

## 2019-09-14 DIAGNOSIS — Z951 Presence of aortocoronary bypass graft: Secondary | ICD-10-CM

## 2019-09-14 DIAGNOSIS — I9789 Other postprocedural complications and disorders of the circulatory system, not elsewhere classified: Secondary | ICD-10-CM

## 2019-09-14 DIAGNOSIS — I4891 Unspecified atrial fibrillation: Secondary | ICD-10-CM

## 2019-09-14 DIAGNOSIS — Z953 Presence of xenogenic heart valve: Secondary | ICD-10-CM

## 2019-09-14 HISTORY — DX: Unspecified atrial fibrillation: I48.91

## 2019-09-14 HISTORY — PX: TEE WITHOUT CARDIOVERSION: SHX5443

## 2019-09-14 HISTORY — PX: CORONARY ARTERY BYPASS GRAFT: SHX141

## 2019-09-14 HISTORY — DX: Presence of aortocoronary bypass graft: Z95.1

## 2019-09-14 HISTORY — PX: AORTIC VALVE REPLACEMENT: SHX41

## 2019-09-14 HISTORY — DX: Other postprocedural complications and disorders of the circulatory system, not elsewhere classified: I97.89

## 2019-09-14 LAB — POCT I-STAT 7, (LYTES, BLD GAS, ICA,H+H)
Acid-Base Excess: 0 mmol/L (ref 0.0–2.0)
Acid-Base Excess: 2 mmol/L (ref 0.0–2.0)
Acid-Base Excess: 4 mmol/L — ABNORMAL HIGH (ref 0.0–2.0)
Acid-Base Excess: 0 mmol/L (ref 0.0–2.0)
Acid-base deficit: 1 mmol/L (ref 0.0–2.0)
Acid-base deficit: 2 mmol/L (ref 0.0–2.0)
Acid-base deficit: 2 mmol/L (ref 0.0–2.0)
Bicarbonate: 26.8 mmol/L (ref 20.0–28.0)
Bicarbonate: 27.2 mmol/L (ref 20.0–28.0)
Bicarbonate: 29.5 mmol/L — ABNORMAL HIGH (ref 20.0–28.0)
Bicarbonate: 25.1 mmol/L (ref 20.0–28.0)
Bicarbonate: 27 mmol/L (ref 20.0–28.0)
Bicarbonate: 23.7 mmol/L (ref 20.0–28.0)
Bicarbonate: 24.1 mmol/L (ref 20.0–28.0)
Calcium, Ion: 1.26 mmol/L (ref 1.15–1.40)
Calcium, Ion: 1.01 mmol/L — ABNORMAL LOW (ref 1.15–1.40)
Calcium, Ion: 1.11 mmol/L — ABNORMAL LOW (ref 1.15–1.40)
Calcium, Ion: 1.1 mmol/L — ABNORMAL LOW (ref 1.15–1.40)
Calcium, Ion: 1.12 mmol/L — ABNORMAL LOW (ref 1.15–1.40)
Calcium, Ion: 1.11 mmol/L — ABNORMAL LOW (ref 1.15–1.40)
Calcium, Ion: 1.15 mmol/L (ref 1.15–1.40)
HCT: 39 % (ref 39.0–52.0)
HCT: 28 % — ABNORMAL LOW (ref 39.0–52.0)
HCT: 29 % — ABNORMAL LOW (ref 39.0–52.0)
HCT: 28 % — ABNORMAL LOW (ref 39.0–52.0)
HCT: 29 % — ABNORMAL LOW (ref 39.0–52.0)
HCT: 27 % — ABNORMAL LOW (ref 39.0–52.0)
HCT: 27 % — ABNORMAL LOW (ref 39.0–52.0)
Hemoglobin: 13.3 g/dL (ref 13.0–17.0)
Hemoglobin: 9.5 g/dL — ABNORMAL LOW (ref 13.0–17.0)
Hemoglobin: 9.9 g/dL — ABNORMAL LOW (ref 13.0–17.0)
Hemoglobin: 9.5 g/dL — ABNORMAL LOW (ref 13.0–17.0)
Hemoglobin: 9.9 g/dL — ABNORMAL LOW (ref 13.0–17.0)
Hemoglobin: 9.2 g/dL — ABNORMAL LOW (ref 13.0–17.0)
Hemoglobin: 9.2 g/dL — ABNORMAL LOW (ref 13.0–17.0)
O2 Saturation: 100 %
O2 Saturation: 100 %
O2 Saturation: 100 %
O2 Saturation: 99 %
O2 Saturation: 99 %
O2 Saturation: 99 %
O2 Saturation: 99 %
Patient temperature: 34.9
Patient temperature: 37.1
Patient temperature: 37
Potassium: 3.7 mmol/L (ref 3.5–5.1)
Potassium: 3.8 mmol/L (ref 3.5–5.1)
Potassium: 4.8 mmol/L (ref 3.5–5.1)
Potassium: 4.1 mmol/L (ref 3.5–5.1)
Potassium: 3.6 mmol/L (ref 3.5–5.1)
Potassium: 4.4 mmol/L (ref 3.5–5.1)
Potassium: 4.3 mmol/L (ref 3.5–5.1)
Sodium: 139 mmol/L (ref 135–145)
Sodium: 143 mmol/L (ref 135–145)
Sodium: 140 mmol/L (ref 135–145)
Sodium: 144 mmol/L (ref 135–145)
Sodium: 145 mmol/L (ref 135–145)
Sodium: 145 mmol/L (ref 135–145)
Sodium: 145 mmol/L (ref 135–145)
TCO2: 28 mmol/L (ref 22–32)
TCO2: 29 mmol/L (ref 22–32)
TCO2: 31 mmol/L (ref 22–32)
TCO2: 26 mmol/L (ref 22–32)
TCO2: 29 mmol/L (ref 22–32)
TCO2: 25 mmol/L (ref 22–32)
TCO2: 26 mmol/L (ref 22–32)
pCO2 arterial: 53.5 mmHg — ABNORMAL HIGH (ref 32.0–48.0)
pCO2 arterial: 43.8 mmHg (ref 32.0–48.0)
pCO2 arterial: 50.9 mmHg — ABNORMAL HIGH (ref 32.0–48.0)
pCO2 arterial: 45.1 mmHg (ref 32.0–48.0)
pCO2 arterial: 49.5 mmHg — ABNORMAL HIGH (ref 32.0–48.0)
pCO2 arterial: 44.9 mmHg (ref 32.0–48.0)
pCO2 arterial: 47.7 mmHg (ref 32.0–48.0)
pH, Arterial: 7.308 — ABNORMAL LOW (ref 7.350–7.450)
pH, Arterial: 7.402 (ref 7.350–7.450)
pH, Arterial: 7.371 (ref 7.350–7.450)
pH, Arterial: 7.353 (ref 7.350–7.450)
pH, Arterial: 7.335 — ABNORMAL LOW (ref 7.350–7.450)
pH, Arterial: 7.331 — ABNORMAL LOW (ref 7.350–7.450)
pH, Arterial: 7.312 — ABNORMAL LOW (ref 7.350–7.450)
pO2, Arterial: 463 mmHg — ABNORMAL HIGH (ref 83.0–108.0)
pO2, Arterial: 348 mmHg — ABNORMAL HIGH (ref 83.0–108.0)
pO2, Arterial: 341 mmHg — ABNORMAL HIGH (ref 83.0–108.0)
pO2, Arterial: 125 mmHg — ABNORMAL HIGH (ref 83.0–108.0)
pO2, Arterial: 143 mmHg — ABNORMAL HIGH (ref 83.0–108.0)
pO2, Arterial: 127 mmHg — ABNORMAL HIGH (ref 83.0–108.0)
pO2, Arterial: 175 mmHg — ABNORMAL HIGH (ref 83.0–108.0)

## 2019-09-14 LAB — POCT I-STAT, CHEM 8
BUN: 7 mg/dL (ref 6–20)
BUN: 6 mg/dL (ref 6–20)
BUN: 6 mg/dL (ref 6–20)
BUN: 6 mg/dL (ref 6–20)
BUN: 5 mg/dL — ABNORMAL LOW (ref 6–20)
Calcium, Ion: 1.27 mmol/L (ref 1.15–1.40)
Calcium, Ion: 1.17 mmol/L (ref 1.15–1.40)
Calcium, Ion: 1.09 mmol/L — ABNORMAL LOW (ref 1.15–1.40)
Calcium, Ion: 1.08 mmol/L — ABNORMAL LOW (ref 1.15–1.40)
Calcium, Ion: 1.12 mmol/L — ABNORMAL LOW (ref 1.15–1.40)
Chloride: 100 mmol/L (ref 98–111)
Chloride: 106 mmol/L (ref 98–111)
Chloride: 102 mmol/L (ref 98–111)
Chloride: 104 mmol/L (ref 98–111)
Chloride: 104 mmol/L (ref 98–111)
Creatinine, Ser: 0.4 mg/dL — ABNORMAL LOW (ref 0.61–1.24)
Creatinine, Ser: 0.3 mg/dL — ABNORMAL LOW (ref 0.61–1.24)
Creatinine, Ser: 0.3 mg/dL — ABNORMAL LOW (ref 0.61–1.24)
Creatinine, Ser: 0.3 mg/dL — ABNORMAL LOW (ref 0.61–1.24)
Creatinine, Ser: 0.4 mg/dL — ABNORMAL LOW (ref 0.61–1.24)
Glucose, Bld: 110 mg/dL — ABNORMAL HIGH (ref 70–99)
Glucose, Bld: 134 mg/dL — ABNORMAL HIGH (ref 70–99)
Glucose, Bld: 118 mg/dL — ABNORMAL HIGH (ref 70–99)
Glucose, Bld: 125 mg/dL — ABNORMAL HIGH (ref 70–99)
Glucose, Bld: 108 mg/dL — ABNORMAL HIGH (ref 70–99)
HCT: 37 % — ABNORMAL LOW (ref 39.0–52.0)
HCT: 35 % — ABNORMAL LOW (ref 39.0–52.0)
HCT: 28 % — ABNORMAL LOW (ref 39.0–52.0)
HCT: 27 % — ABNORMAL LOW (ref 39.0–52.0)
HCT: 25 % — ABNORMAL LOW (ref 39.0–52.0)
Hemoglobin: 12.6 g/dL — ABNORMAL LOW (ref 13.0–17.0)
Hemoglobin: 11.9 g/dL — ABNORMAL LOW (ref 13.0–17.0)
Hemoglobin: 9.5 g/dL — ABNORMAL LOW (ref 13.0–17.0)
Hemoglobin: 9.2 g/dL — ABNORMAL LOW (ref 13.0–17.0)
Hemoglobin: 8.5 g/dL — ABNORMAL LOW (ref 13.0–17.0)
Potassium: 3.8 mmol/L (ref 3.5–5.1)
Potassium: 3.8 mmol/L (ref 3.5–5.1)
Potassium: 4.8 mmol/L (ref 3.5–5.1)
Potassium: 4.9 mmol/L (ref 3.5–5.1)
Potassium: 4.3 mmol/L (ref 3.5–5.1)
Sodium: 139 mmol/L (ref 135–145)
Sodium: 141 mmol/L (ref 135–145)
Sodium: 139 mmol/L (ref 135–145)
Sodium: 142 mmol/L (ref 135–145)
Sodium: 143 mmol/L (ref 135–145)
TCO2: 28 mmol/L (ref 22–32)
TCO2: 23 mmol/L (ref 22–32)
TCO2: 28 mmol/L (ref 22–32)
TCO2: 26 mmol/L (ref 22–32)
TCO2: 25 mmol/L (ref 22–32)

## 2019-09-14 LAB — BASIC METABOLIC PANEL
Anion gap: 9 (ref 5–15)
Anion gap: 8 (ref 5–15)
BUN: 8 mg/dL (ref 6–20)
BUN: 7 mg/dL (ref 6–20)
CO2: 25 mmol/L (ref 22–32)
CO2: 23 mmol/L (ref 22–32)
Calcium: 9.3 mg/dL (ref 8.9–10.3)
Calcium: 7.8 mg/dL — ABNORMAL LOW (ref 8.9–10.3)
Chloride: 102 mmol/L (ref 98–111)
Chloride: 112 mmol/L — ABNORMAL HIGH (ref 98–111)
Creatinine, Ser: 0.61 mg/dL (ref 0.61–1.24)
Creatinine, Ser: 0.69 mg/dL (ref 0.61–1.24)
GFR calc Af Amer: >60 mL/min (ref >60)
GFR calc Af Amer: >60 mL/min (ref >60)
GFR calc non Af Amer: >60 mL/min (ref >60)
GFR calc non Af Amer: >60 mL/min (ref >60)
Glucose, Bld: 108 mg/dL — ABNORMAL HIGH (ref 70–99)
Glucose, Bld: 125 mg/dL — ABNORMAL HIGH (ref 70–99)
Potassium: 3.5 mmol/L (ref 3.5–5.1)
Potassium: 4.3 mmol/L (ref 3.5–5.1)
Sodium: 136 mmol/L (ref 135–145)
Sodium: 143 mmol/L (ref 135–145)

## 2019-09-14 LAB — GLUCOSE, CAPILLARY
Glucose-Capillary: 103 mg/dL — ABNORMAL HIGH (ref 70–99)
Glucose-Capillary: 99 mg/dL (ref 70–99)
Glucose-Capillary: 105 mg/dL — ABNORMAL HIGH (ref 70–99)
Glucose-Capillary: 117 mg/dL — ABNORMAL HIGH (ref 70–99)
Glucose-Capillary: 126 mg/dL — ABNORMAL HIGH (ref 70–99)

## 2019-09-14 LAB — HEMOGLOBIN AND HEMATOCRIT, BLOOD
HCT: 28.2 % — ABNORMAL LOW (ref 39.0–52.0)
Hemoglobin: 9.7 g/dL — ABNORMAL LOW (ref 13.0–17.0)

## 2019-09-14 LAB — APTT: aPTT: 46 seconds — ABNORMAL HIGH (ref 24–36)

## 2019-09-14 LAB — CBC
HCT: 43.8 % (ref 39.0–52.0)
HCT: 33 % — ABNORMAL LOW (ref 39.0–52.0)
HCT: 30.6 % — ABNORMAL LOW (ref 39.0–52.0)
Hemoglobin: 14.9 g/dL (ref 13.0–17.0)
Hemoglobin: 11.1 g/dL — ABNORMAL LOW (ref 13.0–17.0)
Hemoglobin: 10 g/dL — ABNORMAL LOW (ref 13.0–17.0)
MCH: 31.2 pg (ref 26.0–34.0)
MCH: 31.8 pg (ref 26.0–34.0)
MCH: 31 pg (ref 26.0–34.0)
MCHC: 34 g/dL (ref 30.0–36.0)
MCHC: 33.6 g/dL (ref 30.0–36.0)
MCHC: 32.7 g/dL (ref 30.0–36.0)
MCV: 91.6 fL (ref 80.0–100.0)
MCV: 94.6 fL (ref 80.0–100.0)
MCV: 94.7 fL (ref 80.0–100.0)
Platelets: 175 10*3/uL (ref 150–400)
Platelets: 69 10*3/uL — ABNORMAL LOW (ref 150–400)
Platelets: 141 10*3/uL — ABNORMAL LOW (ref 150–400)
RBC: 4.78 MIL/uL (ref 4.22–5.81)
RBC: 3.49 MIL/uL — ABNORMAL LOW (ref 4.22–5.81)
RBC: 3.23 MIL/uL — ABNORMAL LOW (ref 4.22–5.81)
RDW: 12.9 % (ref 11.5–15.5)
RDW: 13.1 % (ref 11.5–15.5)
RDW: 13.2 % (ref 11.5–15.5)
WBC: 9.5 10*3/uL (ref 4.0–10.5)
WBC: 13.7 10*3/uL — ABNORMAL HIGH (ref 4.0–10.5)
WBC: 11.9 10*3/uL — ABNORMAL HIGH (ref 4.0–10.5)
nRBC: 0 % (ref 0.0–0.2)
nRBC: 0 % (ref 0.0–0.2)
nRBC: 0 % (ref 0.0–0.2)

## 2019-09-14 LAB — HEPARIN LEVEL (UNFRACTIONATED): Heparin Unfractionated: 0.57 IU/mL (ref 0.30–0.70)

## 2019-09-14 LAB — ECHO INTRAOPERATIVE TEE
AR max vel: 0.52 cm2
AV Area VTI: 0.44 cm2
AV Area mean vel: 0.46 cm2
AV Mean grad: 61.5 mmHg
AV Peak grad: 89.7 mmHg
Ao pk vel: 4.74 m/s
Height: 63 in
S' Lateral: 2 cm
Weight: 2211.2 oz

## 2019-09-14 LAB — PROTIME-INR
INR: 1.7 — ABNORMAL HIGH (ref 0.8–1.2)
Prothrombin Time: 19.3 seconds — ABNORMAL HIGH (ref 11.4–15.2)

## 2019-09-14 LAB — MAGNESIUM: Magnesium: 2.6 mg/dL — ABNORMAL HIGH (ref 1.7–2.4)

## 2019-09-14 LAB — PLATELET COUNT: Platelets: 74 10*3/uL — ABNORMAL LOW (ref 150–400)

## 2019-09-14 LAB — HEMOGLOBIN A1C
Hgb A1c MFr Bld: 5.8 % — ABNORMAL HIGH (ref 4.8–5.6)
Mean Plasma Glucose: 120 mg/dL

## 2019-09-14 SURGERY — CORONARY ARTERY BYPASS GRAFTING (CABG)
Anesthesia: General | Site: Chest

## 2019-09-14 MED ORDER — MAGNESIUM SULFATE 4 GM/100ML IV SOLN: 4.0000 g | Freq: Once | INTRAVENOUS | Status: AC

## 2019-09-14 MED ORDER — STERILE WATER FOR INJECTION IJ SOLN
INTRAMUSCULAR | Status: DC | PRN
  Administered 2019-09-14: 20 mL

## 2019-09-14 MED ORDER — LACTATED RINGERS IV SOLN: 500.0000 mL | Freq: Once | INTRAVENOUS | Status: DC | PRN

## 2019-09-14 MED ORDER — CHLORHEXIDINE GLUCONATE 0.12% ORAL RINSE (MEDLINE KIT): 15.0000 mL | Freq: Two times a day (BID) | OROMUCOSAL | Status: DC

## 2019-09-14 MED ORDER — BISACODYL 10 MG RE SUPP: 10.0000 mg | Freq: Every day | RECTAL | Status: DC

## 2019-09-14 MED ORDER — SODIUM CHLORIDE 0.45 % IV SOLN: INTRAVENOUS | Status: DC | PRN

## 2019-09-14 MED ORDER — PLASMA-LYTE 148 IV SOLN
INTRAVENOUS | Status: DC | PRN
  Administered 2019-09-14: 500 mL via INTRAVASCULAR

## 2019-09-14 MED ORDER — ROCURONIUM BROMIDE 10 MG/ML (PF) SYRINGE
PREFILLED_SYRINGE | INTRAVENOUS | Status: DC | PRN
  Administered 2019-09-14 (×4): 50 mg via INTRAVENOUS

## 2019-09-14 MED ORDER — BISACODYL 5 MG PO TBEC
10.0000 mg | DELAYED_RELEASE_TABLET | Freq: Every day | ORAL | Status: DC
  Administered 2019-09-15 – 2019-09-18 (×4): 10 mg via ORAL
  Filled 2019-09-14 (×5): qty 2

## 2019-09-14 MED ORDER — VANCOMYCIN HCL 1000 MG IV SOLR
INTRAVENOUS | Status: DC | PRN
  Administered 2019-09-14: 3000 mg

## 2019-09-14 MED ORDER — HEPARIN SODIUM (PORCINE) 1000 UNIT/ML IJ SOLN
INTRAMUSCULAR | Status: DC | PRN
  Administered 2019-09-14: 22000 [IU] via INTRAVENOUS

## 2019-09-14 MED ORDER — EPHEDRINE SULFATE 50 MG/ML IJ SOLN
INTRAMUSCULAR | Status: DC | PRN
  Administered 2019-09-14 (×2): 10 mg via INTRAVENOUS

## 2019-09-14 MED ORDER — INSULIN REGULAR(HUMAN) IN NACL 100-0.9 UT/100ML-% IV SOLN: INTRAVENOUS | Status: DC

## 2019-09-14 MED ORDER — FENTANYL CITRATE (PF) 250 MCG/5ML IJ SOLN
INTRAMUSCULAR | Status: DC | PRN
Start: 2019-09-14 — End: 2019-09-14
  Administered 2019-09-14: 100 ug via INTRAVENOUS
  Administered 2019-09-14: 300 ug via INTRAVENOUS
  Administered 2019-09-14: 50 ug via INTRAVENOUS
  Administered 2019-09-14 (×2): 100 ug via INTRAVENOUS
  Administered 2019-09-14: 150 ug via INTRAVENOUS
  Administered 2019-09-14 (×2): 100 ug via INTRAVENOUS

## 2019-09-14 MED ORDER — DEXTROSE 50 % IV SOLN: 0.0000 mL | INTRAVENOUS | Status: DC | PRN

## 2019-09-14 MED ORDER — PANTOPRAZOLE SODIUM 40 MG PO TBEC
40.0000 mg | DELAYED_RELEASE_TABLET | Freq: Every day | ORAL | Status: DC
  Administered 2019-09-16 – 2019-09-19 (×4): 40 mg via ORAL
  Filled 2019-09-14 (×4): qty 1

## 2019-09-14 MED ORDER — POTASSIUM CHLORIDE 10 MEQ/50ML IV SOLN
10.0000 meq | INTRAVENOUS | Status: AC
  Administered 2019-09-14 (×3): 10 meq via INTRAVENOUS

## 2019-09-14 MED ORDER — MAGNESIUM SULFATE 4 GM/100ML IV SOLN
INTRAVENOUS | Status: AC
  Administered 2019-09-14: 4 g via INTRAVENOUS
  Filled 2019-09-14: qty 100

## 2019-09-14 MED ORDER — NITROGLYCERIN IN D5W 200-5 MCG/ML-% IV SOLN: 0.0000 ug/min | INTRAVENOUS | Status: DC

## 2019-09-14 MED ORDER — SODIUM CHLORIDE 0.9% FLUSH: 3.0000 mL | INTRAVENOUS | Status: DC | PRN

## 2019-09-14 MED ORDER — METOPROLOL TARTRATE 12.5 MG HALF TABLET
12.5000 mg | ORAL_TABLET | Freq: Two times a day (BID) | ORAL | Status: DC
  Administered 2019-09-15 – 2019-09-16 (×3): 12.5 mg via ORAL
  Filled 2019-09-14 (×4): qty 1

## 2019-09-14 MED ORDER — ETOMIDATE 2 MG/ML IV SOLN
INTRAVENOUS | Status: DC | PRN
  Administered 2019-09-14: 16 mg via INTRAVENOUS

## 2019-09-14 MED ORDER — THROMBIN 5000 UNITS EX SOLR
CUTANEOUS | Status: DC | PRN
  Administered 2019-09-14: 5000 [IU] via TOPICAL

## 2019-09-14 MED ORDER — SODIUM CHLORIDE 0.9% IV SOLUTION: Freq: Once | INTRAVENOUS | Status: AC

## 2019-09-14 MED ORDER — CHLORHEXIDINE GLUCONATE 0.12 % MT SOLN
15.0000 mL | OROMUCOSAL | Status: AC
  Administered 2019-09-14: 15 mL via OROMUCOSAL

## 2019-09-14 MED ORDER — VANCOMYCIN HCL 1000 MG IV SOLR
INTRAVENOUS | Status: AC
  Filled 2019-09-14: qty 3000

## 2019-09-14 MED ORDER — ONDANSETRON HCL 4 MG/2ML IJ SOLN
4.0000 mg | Freq: Four times a day (QID) | INTRAMUSCULAR | Status: DC | PRN
  Administered 2019-09-14 – 2019-09-17 (×5): 4 mg via INTRAVENOUS
  Filled 2019-09-14 (×5): qty 2

## 2019-09-14 MED ORDER — CHLORHEXIDINE GLUCONATE CLOTH 2 % EX PADS
6.0000 | MEDICATED_PAD | Freq: Every day | CUTANEOUS | Status: AC
  Administered 2019-09-15 – 2019-09-16 (×2): 6 via TOPICAL

## 2019-09-14 MED ORDER — ALBUMIN HUMAN 5 % IV SOLN
250.0000 mL | INTRAVENOUS | Status: AC | PRN
  Administered 2019-09-14 (×3): 12.5 g via INTRAVENOUS
  Filled 2019-09-14: qty 250

## 2019-09-14 MED ORDER — ASPIRIN EC 325 MG PO TBEC
325.0000 mg | DELAYED_RELEASE_TABLET | Freq: Every day | ORAL | Status: DC
  Administered 2019-09-15: 325 mg via ORAL
  Filled 2019-09-14: qty 1

## 2019-09-14 MED ORDER — BUPIVACAINE HCL (PF) 0.5 % IJ SOLN
INTRAMUSCULAR | Status: DC | PRN
  Administered 2019-09-14: 30 mL

## 2019-09-14 MED ORDER — SODIUM CHLORIDE 0.9% FLUSH
10.0000 mL | Freq: Two times a day (BID) | INTRAVENOUS | Status: DC
  Administered 2019-09-14 – 2019-09-17 (×6): 10 mL

## 2019-09-14 MED ORDER — ACETAMINOPHEN 650 MG RE SUPP
650.0000 mg | Freq: Once | RECTAL | Status: AC
  Administered 2019-09-14: 650 mg via RECTAL

## 2019-09-14 MED ORDER — BUPIVACAINE LIPOSOME 1.3 % IJ SUSP
INTRAMUSCULAR | Status: DC | PRN
  Administered 2019-09-14: 20 mL

## 2019-09-14 MED ORDER — HEMOSTATIC AGENTS (NO CHARGE) OPTIME
TOPICAL | Status: DC | PRN
  Administered 2019-09-14: 1 via TOPICAL

## 2019-09-14 MED ORDER — LIDOCAINE 2% (20 MG/ML) 5 ML SYRINGE
INTRAMUSCULAR | Status: DC | PRN
  Administered 2019-09-14: 40 mg via INTRAVENOUS

## 2019-09-14 MED ORDER — ORAL CARE MOUTH RINSE
15.0000 mL | Freq: Two times a day (BID) | OROMUCOSAL | Status: DC
  Administered 2019-09-15: 15 mL via OROMUCOSAL

## 2019-09-14 MED ORDER — OXYCODONE HCL 5 MG PO TABS
5.0000 mg | ORAL_TABLET | ORAL | Status: DC | PRN
  Administered 2019-09-14 – 2019-09-15 (×2): 5 mg via ORAL
  Filled 2019-09-14 (×2): qty 1

## 2019-09-14 MED ORDER — BUPIVACAINE HCL (PF) 0.5 % IJ SOLN
INTRAMUSCULAR | Status: AC
  Filled 2019-09-14: qty 30

## 2019-09-14 MED ORDER — DEXMEDETOMIDINE HCL IN NACL 400 MCG/100ML IV SOLN
0.0000 ug/kg/h | INTRAVENOUS | Status: DC
  Filled 2019-09-14: qty 100

## 2019-09-14 MED ORDER — ASPIRIN 81 MG PO CHEW: 324.0000 mg | CHEWABLE_TABLET | Freq: Every day | ORAL | Status: DC

## 2019-09-14 MED ORDER — LACTATED RINGERS IV SOLN: INTRAVENOUS | Status: DC

## 2019-09-14 MED ORDER — STERILE WATER FOR INJECTION IJ SOLN
INTRAMUSCULAR | Status: AC
  Filled 2019-09-14: qty 10

## 2019-09-14 MED ORDER — VANCOMYCIN HCL IN DEXTROSE 1-5 GM/200ML-% IV SOLN
1000.0000 mg | Freq: Once | INTRAVENOUS | Status: AC
  Administered 2019-09-14: 1000 mg via INTRAVENOUS
  Filled 2019-09-14: qty 200

## 2019-09-14 MED ORDER — SODIUM CHLORIDE 0.9% FLUSH: 10.0000 mL | INTRAVENOUS | Status: DC | PRN

## 2019-09-14 MED ORDER — ACETAMINOPHEN 160 MG/5ML PO SOLN: 1000.0000 mg | Freq: Four times a day (QID) | ORAL | Status: DC

## 2019-09-14 MED ORDER — INSULIN ASPART 100 UNIT/ML ~~LOC~~ SOLN
0.0000 [IU] | SUBCUTANEOUS | Status: DC
  Administered 2019-09-14 – 2019-09-16 (×8): 2 [IU] via SUBCUTANEOUS
  Administered 2019-09-16: 4 [IU] via SUBCUTANEOUS
  Administered 2019-09-16 – 2019-09-18 (×5): 2 [IU] via SUBCUTANEOUS

## 2019-09-14 MED ORDER — FAMOTIDINE IN NACL 20-0.9 MG/50ML-% IV SOLN
20.0000 mg | Freq: Two times a day (BID) | INTRAVENOUS | Status: AC
  Administered 2019-09-14: 20 mg via INTRAVENOUS
  Filled 2019-09-14: qty 50

## 2019-09-14 MED ORDER — MIDAZOLAM HCL 5 MG/5ML IJ SOLN
INTRAMUSCULAR | Status: DC | PRN
  Administered 2019-09-14: 2 mg via INTRAVENOUS
  Administered 2019-09-14: 1 mg via INTRAVENOUS
  Administered 2019-09-14: 2 mg via INTRAVENOUS
  Administered 2019-09-14: 1 mg via INTRAVENOUS
  Administered 2019-09-14 (×2): 2 mg via INTRAVENOUS

## 2019-09-14 MED ORDER — LACTATED RINGERS IV SOLN: INTRAVENOUS | Status: DC | PRN

## 2019-09-14 MED ORDER — DOCUSATE SODIUM 100 MG PO CAPS
200.0000 mg | ORAL_CAPSULE | Freq: Every day | ORAL | Status: DC
  Administered 2019-09-15 – 2019-09-18 (×4): 200 mg via ORAL
  Filled 2019-09-14 (×5): qty 2

## 2019-09-14 MED ORDER — ACETAMINOPHEN 500 MG PO TABS
1000.0000 mg | ORAL_TABLET | Freq: Four times a day (QID) | ORAL | Status: DC
  Administered 2019-09-15 – 2019-09-18 (×13): 1000 mg via ORAL
  Filled 2019-09-14 (×14): qty 2

## 2019-09-14 MED ORDER — METOPROLOL TARTRATE 25 MG/10 ML ORAL SUSPENSION: 12.5000 mg | Freq: Two times a day (BID) | ORAL | Status: DC

## 2019-09-14 MED ORDER — 0.9 % SODIUM CHLORIDE (POUR BTL) OPTIME
TOPICAL | Status: DC | PRN
  Administered 2019-09-14: 6000 mL

## 2019-09-14 MED ORDER — SODIUM CHLORIDE 0.9 % IV SOLN: 250.0000 mL | INTRAVENOUS | Status: DC

## 2019-09-14 MED ORDER — SODIUM CHLORIDE (PF) 0.9 % IJ SOLN
OROMUCOSAL | Status: DC | PRN
  Administered 2019-09-14: 4 mL via TOPICAL

## 2019-09-14 MED ORDER — PHENYLEPHRINE 40 MCG/ML (10ML) SYRINGE FOR IV PUSH (FOR BLOOD PRESSURE SUPPORT)
PREFILLED_SYRINGE | INTRAVENOUS | Status: DC | PRN
  Administered 2019-09-14 (×3): 80 ug via INTRAVENOUS

## 2019-09-14 MED ORDER — SODIUM CHLORIDE 0.9 % IV SOLN: INTRAVENOUS | Status: DC

## 2019-09-14 MED ORDER — ALBUMIN HUMAN 5 % IV SOLN: INTRAVENOUS | Status: DC | PRN

## 2019-09-14 MED ORDER — SODIUM CHLORIDE 0.9 % IV SOLN
1.5000 g | Freq: Two times a day (BID) | INTRAVENOUS | Status: AC
  Administered 2019-09-14 – 2019-09-16 (×4): 1.5 g via INTRAVENOUS
  Filled 2019-09-14 (×4): qty 1.5

## 2019-09-14 MED ORDER — CHLORHEXIDINE GLUCONATE 0.12 % MT SOLN
15.0000 mL | Freq: Two times a day (BID) | OROMUCOSAL | Status: DC
  Administered 2019-09-14 – 2019-09-18 (×8): 15 mL via OROMUCOSAL
  Filled 2019-09-14 (×7): qty 15

## 2019-09-14 MED ORDER — MIDAZOLAM HCL 2 MG/2ML IJ SOLN: 2.0000 mg | INTRAMUSCULAR | Status: DC | PRN

## 2019-09-14 MED ORDER — FAMOTIDINE IN NACL 20-0.9 MG/50ML-% IV SOLN
INTRAVENOUS | Status: AC
  Administered 2019-09-14: 20 mg via INTRAVENOUS
  Filled 2019-09-14: qty 50

## 2019-09-14 MED ORDER — SODIUM CHLORIDE 0.9% FLUSH
3.0000 mL | Freq: Two times a day (BID) | INTRAVENOUS | Status: DC
  Administered 2019-09-15 – 2019-09-16 (×3): 3 mL via INTRAVENOUS

## 2019-09-14 MED ORDER — ACETAMINOPHEN 160 MG/5ML PO SOLN: 650.0000 mg | Freq: Once | ORAL | Status: AC

## 2019-09-14 MED ORDER — ORAL CARE MOUTH RINSE
15.0000 mL | OROMUCOSAL | Status: DC
  Administered 2019-09-14 (×2): 15 mL via OROMUCOSAL

## 2019-09-14 MED ORDER — PHENYLEPHRINE HCL-NACL 20-0.9 MG/250ML-% IV SOLN: 0.0000 ug/min | INTRAVENOUS | Status: DC

## 2019-09-14 MED ORDER — TRAMADOL HCL 50 MG PO TABS
50.0000 mg | ORAL_TABLET | ORAL | Status: DC | PRN
  Administered 2019-09-15 – 2019-09-16 (×2): 50 mg via ORAL
  Filled 2019-09-14 (×2): qty 1

## 2019-09-14 MED ORDER — NON FORMULARY
Status: DC | PRN
  Administered 2019-09-14: 10 mL

## 2019-09-14 MED ORDER — PROTAMINE SULFATE 10 MG/ML IV SOLN
INTRAVENOUS | Status: DC | PRN
  Administered 2019-09-14: 220 mg via INTRAVENOUS

## 2019-09-14 MED ORDER — BUPIVACAINE LIPOSOME 1.3 % IJ SUSP
20.0000 mL | Freq: Once | INTRAMUSCULAR | Status: DC
  Filled 2019-09-14: qty 20

## 2019-09-14 MED ORDER — VANCOMYCIN HCL 1000 MG IV SOLR
INTRAVENOUS | Status: DC | PRN
  Administered 2019-09-14 (×3): 1000 mg

## 2019-09-14 MED ORDER — METOPROLOL TARTRATE 5 MG/5ML IV SOLN
2.5000 mg | INTRAVENOUS | Status: DC | PRN
  Administered 2019-09-16: 5 mg via INTRAVENOUS
  Filled 2019-09-14 (×2): qty 5

## 2019-09-14 MED ORDER — PROPOFOL 10 MG/ML IV BOLUS
INTRAVENOUS | Status: DC | PRN
  Administered 2019-09-14 (×3): 10 mg via INTRAVENOUS

## 2019-09-14 MED ORDER — MORPHINE SULFATE (PF) 2 MG/ML IV SOLN
1.0000 mg | INTRAVENOUS | Status: DC | PRN
  Administered 2019-09-14 (×2): 2 mg via INTRAVENOUS
  Filled 2019-09-14 (×2): qty 1

## 2019-09-14 MED FILL — Calcium Chloride Inj 10%: INTRAVENOUS | Qty: 5 | Status: AC

## 2019-09-14 MED FILL — Thrombin For Soln 5000 Unit: CUTANEOUS | Qty: 5000 | Status: AC

## 2019-09-14 SURGICAL SUPPLY — 106 items
ADAPTER CARDIO PERF ANTE/RETRO (ADAPTER) ×3 IMPLANT
APPLICATOR TIP COSEAL (VASCULAR PRODUCTS) ×1 IMPLANT
BAG DECANTER FOR FLEXI CONT (MISCELLANEOUS) ×3 IMPLANT
BLADE CLIPPER SURG (BLADE) ×3 IMPLANT
BLADE STERNUM SYSTEM 6 (BLADE) ×3 IMPLANT
BNDG ELASTIC 4X5.8 VLCR STR LF (GAUZE/BANDAGES/DRESSINGS) ×3 IMPLANT
BNDG ELASTIC 6X5.8 VLCR STR LF (GAUZE/BANDAGES/DRESSINGS) ×2 IMPLANT
BNDG GAUZE ELAST 4 BULKY (GAUZE/BANDAGES/DRESSINGS) ×3 IMPLANT
CANISTER SUCT 3000ML PPV (MISCELLANEOUS) ×3 IMPLANT
CANNULA GUNDRY RCSP 15FR (MISCELLANEOUS) ×1 IMPLANT
CANNULA NON VENT 18FR 12 (CANNULA) ×1 IMPLANT
CATH CPB KIT HENDRICKSON (MISCELLANEOUS) ×3 IMPLANT
CATH HEART VENT LEFT (CATHETERS) IMPLANT
CATH RETROPLEGIA CORONARY 14FR (CATHETERS) ×1 IMPLANT
CATH ROBINSON RED A/P 18FR (CATHETERS) ×7 IMPLANT
CLIP RETRACTION 3.0MM CORONARY (MISCELLANEOUS) ×2 IMPLANT
CLIP VESOCCLUDE SM WIDE 24/CT (CLIP) ×1 IMPLANT
CNTNR URN SCR LID CUP LEK RST (MISCELLANEOUS) IMPLANT
CONT SPEC 4OZ STRL OR WHT (MISCELLANEOUS) ×1
DERMABOND ADHESIVE PROPEN (GAUZE/BANDAGES/DRESSINGS) ×2
DERMABOND ADVANCED (GAUZE/BANDAGES/DRESSINGS) ×1
DERMABOND ADVANCED .7 DNX12 (GAUZE/BANDAGES/DRESSINGS) ×2 IMPLANT
DERMABOND ADVANCED .7 DNX6 (GAUZE/BANDAGES/DRESSINGS) IMPLANT
DEVICE SUT CK QUICK LOAD MINI (Prosthesis & Implant Heart) ×1 IMPLANT
DRAIN CHANNEL 28F RND 3/8 FF (WOUND CARE) ×9 IMPLANT
DRAPE CARDIOVASCULAR INCISE (DRAPES) ×1
DRAPE SLUSH/WARMER DISC (DRAPES) ×3 IMPLANT
DRAPE SRG 135X102X78XABS (DRAPES) ×2 IMPLANT
DRSG AQUACEL AG ADV 3.5X14 (GAUZE/BANDAGES/DRESSINGS) ×3 IMPLANT
ELECT CAUTERY BLADE 6.4 (BLADE) ×3 IMPLANT
ELECT REM PT RETURN 9FT ADLT (ELECTROSURGICAL) ×6
ELECTRODE REM PT RTRN 9FT ADLT (ELECTROSURGICAL) ×4 IMPLANT
FELT TEFLON 1X6 (MISCELLANEOUS) ×6 IMPLANT
GAUZE SPONGE 4X4 12PLY STRL (GAUZE/BANDAGES/DRESSINGS) ×6 IMPLANT
GAUZE SPONGE 4X4 12PLY STRL LF (GAUZE/BANDAGES/DRESSINGS) ×1 IMPLANT
GLOVE BIO SURGEON STRL SZ 6 (GLOVE) ×3 IMPLANT
GLOVE BIO SURGEON STRL SZ 6.5 (GLOVE) ×5 IMPLANT
GLOVE BIO SURGEON STRL SZ7 (GLOVE) ×1 IMPLANT
GLOVE NEODERM STRL 7.5 LF PF (GLOVE) ×6 IMPLANT
GLOVE SURG NEODERM 7.5  LF PF (GLOVE) ×1
GLOVE SURG SYN 7.5  E (GLOVE) ×2
GLOVE SURG SYN 7.5 E (GLOVE) ×4 IMPLANT
GLOVE SURG SYN 7.5 PF PI (GLOVE) IMPLANT
GOWN STRL REUS W/ TWL LRG LVL3 (GOWN DISPOSABLE) ×8 IMPLANT
GOWN STRL REUS W/TWL LRG LVL3 (GOWN DISPOSABLE) ×8
HEMOSTAT POWDER SURGIFOAM 1G (HEMOSTASIS) ×5 IMPLANT
INSERT SUTURE HOLDER (MISCELLANEOUS) ×5 IMPLANT
IV ADAPTER SYR DOUBLE MALE LL (MISCELLANEOUS) ×1 IMPLANT
KIT APPLICATOR RATIO 11:1 (KITS) ×2 IMPLANT
KIT BASIN OR (CUSTOM PROCEDURE TRAY) ×3 IMPLANT
KIT SUCTION CATH 14FR (SUCTIONS) ×3 IMPLANT
KIT SUT CK MINI COMBO 4X17 (Prosthesis & Implant Heart) ×1 IMPLANT
KIT TURNOVER KIT B (KITS) ×3 IMPLANT
KIT VASOVIEW HEMOPRO 2 VH 4000 (KITS) ×2 IMPLANT
LINE VENT (MISCELLANEOUS) ×1 IMPLANT
MARKER GRAFT CORONARY BYPASS (MISCELLANEOUS) ×6 IMPLANT
NDL 18GX1X1/2 (RX/OR ONLY) (NEEDLE) ×2 IMPLANT
NEEDLE 18GX1X1/2 (RX/OR ONLY) (NEEDLE) ×6 IMPLANT
NS IRRIG 1000ML POUR BTL (IV SOLUTION) ×16 IMPLANT
PACK E OPEN HEART (SUTURE) ×3 IMPLANT
PACK OPEN HEART (CUSTOM PROCEDURE TRAY) ×3 IMPLANT
PACK PLATELET PROCEDURE 60 (MISCELLANEOUS) ×1 IMPLANT
PACK SPY-PHI (KITS) ×1 IMPLANT
PAD ARMBOARD 7.5X6 YLW CONV (MISCELLANEOUS) ×6 IMPLANT
PAD ELECT DEFIB RADIOL ZOLL (MISCELLANEOUS) ×3 IMPLANT
PENCIL BUTTON HOLSTER BLD 10FT (ELECTRODE) ×3 IMPLANT
POSITIONER HEAD DONUT 9IN (MISCELLANEOUS) ×3 IMPLANT
POWDER SURGICEL 3.0 GRAM (HEMOSTASIS) ×2 IMPLANT
SEALANT SURG COSEAL 8ML (VASCULAR PRODUCTS) ×1 IMPLANT
SET CARDIOPLEGIA MPS 5001102 (MISCELLANEOUS) ×1 IMPLANT
SUPPORT HEART JANKE-BARRON (MISCELLANEOUS) ×3 IMPLANT
SUT BONE WAX W31G (SUTURE) ×3 IMPLANT
SUT ETHIBON 2 0 V 52N 30 (SUTURE) ×3 IMPLANT
SUT ETHIBOND X763 2 0 SH 1 (SUTURE) ×1 IMPLANT
SUT MNCRL AB 3-0 PS2 18 (SUTURE) ×6 IMPLANT
SUT PDS AB 1 CTX 36 (SUTURE) ×6 IMPLANT
SUT PROLENE 3 0 SH DA (SUTURE) ×5 IMPLANT
SUT PROLENE 3 0 SH1 36 (SUTURE) ×2 IMPLANT
SUT PROLENE 5 0 C 1 36 (SUTURE) ×1 IMPLANT
SUT PROLENE 6 0 C 1 30 (SUTURE) ×9 IMPLANT
SUT PROLENE 8 0 BV175 6 (SUTURE) ×1 IMPLANT
SUT PROLENE BLUE 7 0 (SUTURE) ×3 IMPLANT
SUT SILK  1 MH (SUTURE) ×1
SUT SILK 1 MH (SUTURE) IMPLANT
SUT SILK 2 0 SH CR/8 (SUTURE) IMPLANT
SUT SILK 3 0 SH CR/8 (SUTURE) IMPLANT
SUT STEEL 6MS V (SUTURE) ×3 IMPLANT
SUT STEEL SZ 6 DBL 3X14 BALL (SUTURE) ×3 IMPLANT
SUT VIC AB 2-0 CTX 27 (SUTURE) IMPLANT
SUT VIC AB 3-0 X1 27 (SUTURE) IMPLANT
SYR 10ML LL (SYRINGE) ×1 IMPLANT
SYR 30ML LL (SYRINGE) ×3 IMPLANT
SYR 3ML LL SCALE MARK (SYRINGE) ×2 IMPLANT
SYR BULB IRRIG 60ML STRL (SYRINGE) ×1 IMPLANT
SYSTEM SAHARA CHEST DRAIN ATS (WOUND CARE) ×3 IMPLANT
TAPE CLOTH SURG 4X10 WHT LF (GAUZE/BANDAGES/DRESSINGS) ×1 IMPLANT
TAPE PAPER 2X10 WHT MICROPORE (GAUZE/BANDAGES/DRESSINGS) ×1 IMPLANT
TOWEL GREEN STERILE (TOWEL DISPOSABLE) ×3 IMPLANT
TOWEL GREEN STERILE FF (TOWEL DISPOSABLE) ×3 IMPLANT
TRAY FOLEY SLVR 16FR TEMP STAT (SET/KITS/TRAYS/PACK) ×3 IMPLANT
TUBING ART PRESS 48 MALE/FEM (TUBING) ×2 IMPLANT
TUBING LAP HI FLOW INSUFFLATIO (TUBING) ×2 IMPLANT
UNDERPAD 30X36 HEAVY ABSORB (UNDERPADS AND DIAPERS) ×3 IMPLANT
VALVE AORTIC SZ25 INSP/RESIL (Prosthesis & Implant Heart) ×1 IMPLANT
VENT LEFT HEART 12002 (CATHETERS) ×3
WATER STERILE IRR 1000ML POUR (IV SOLUTION) ×6 IMPLANT

## 2019-09-14 NOTE — Brief Op Note (Addendum)
09/10/2019 - 09/14/2019  12:16 PM  PATIENT:  Dean Obrien  59 y.o. male  PRE-OPERATIVE DIAGNOSIS:  1. S/p NSTEMI 2. CORONARY ARTERY DISEASE 3.SEVERE AORTIC STENOSIS   POST-OPERATIVE DIAGNOSIS:   1. S/p NSTEMI 2. CORONARY ARTERY DISEASE 3.SEVERE AORTIC STENOSIS  PROCEDURE: TRANSESOPHAGEAL ECHOCARDIOGRAM (TEE), CORONARY ARTERY BYPASS GRAFTING (CABG) x 2 (LIMA to LAD and RIMA to DISTAL RCA)  using bilateral Internal mammary arteries and AORTIC VALVE REPLACEMENT (AVR) USING INSPIRIS VALVE Model # 11500A, Serial # [id number], SIZE 25MM) and INDOCYANINE GREEN FLUORESCENCE IMAGING (ICG)  SURGEON:  Surgeon(s) and Role:    Wonda Olds, MD - Primary  PHYSICIAN ASSISTANT: Lars Pinks PA-C  ASSISTANTS: Vernie Murders RNFA  ANESTHESIA:   general  EBL:  Per anesthesia and perfusion record  DRAINS: Chest tubes placed in the mediastinal and pleural spaces   LOCAL MEDICATIONS USED:  OTHER Exparel  SPECIMEN:  Source of Specimen:  Native AV leaflets  DISPOSITION OF SPECIMEN:  PATHOLOGY  COUNTS CORRECT:  YES  DICTATION: .Dragon Dictation  PLAN OF CARE: Admit to inpatient   PATIENT DISPOSITION:  ICU - intubated and hemodynamically stable.   Delay start of Pharmacological VTE agent (>24hrs) due to surgical blood loss or risk of bleeding: yes  BASELINE WEIGHT: 68 kg  Agree with documentation. Kiet Geer Z. [name], [place]

## 2019-09-14 NOTE — Plan of Care (Signed)

## 2019-09-14 NOTE — Progress Notes (Signed)
Rapid weaning protocol 

## 2019-09-14 NOTE — H&P (Signed)
History and Physical Interval Note:  09/14/2019 7:31 AM  Dean Obrien  has presented today for surgery, with the diagnosis of SEVERE AS CAD.  The various methods of treatment have been discussed with the patient and family. After consideration of risks, benefits and other options for treatment, the patient has consented to  Procedure(s): CORONARY ARTERY BYPASS GRAFTING (CABG), bilateral IMA (N/A) AORTIC VALVE REPLACEMENT (AVR) (N/A) TRANSESOPHAGEAL ECHOCARDIOGRAM (TEE) (N/A) INDOCYANINE GREEN FLUORESCENCE IMAGING (ICG) (N/A) as a surgical intervention.  The patient's history has been reviewed, patient examined, no change in status, stable for surgery.  I have reviewed the patient's chart and labs.  Questions were answered to the patient's satisfaction.     Wonda Olds

## 2019-09-14 NOTE — Anesthesia Procedure Notes (Signed)
Procedure Name: Intubation Date/Time: 09/14/2019 8:00 AM Performed by: Amadeo Garnet, CRNA Pre-anesthesia Checklist: Patient identified, Emergency Drugs available, Suction available and Patient being monitored Patient Re-evaluated:Patient Re-evaluated prior to induction Oxygen Delivery Method: Circle system utilized Preoxygenation: Pre-oxygenation with 100% oxygen Induction Type: IV induction Ventilation: Mask ventilation without difficulty and Oral airway inserted - appropriate to patient size Laryngoscope Size: Mac and 4 Grade View: Grade I Tube type: Oral Tube size: 8.0 mm Number of attempts: 1 Airway Equipment and Method: Stylet Placement Confirmation: ETT inserted through vocal cords under direct vision,  positive ETCO2 and breath sounds checked- equal and bilateral Secured at: 22 cm Tube secured with: Tape Dental Injury: Teeth and Oropharynx as per pre-operative assessment

## 2019-09-14 NOTE — Anesthesia Postprocedure Evaluation (Signed)
Anesthesia Post Note  Patient: Dean Obrien  Procedure(s) Performed: CORONARY ARTERY BYPASS GRAFTING (CABG) x Two using bilateral Internal mammary arteries (N/A Chest) AORTIC VALVE REPLACEMENT (AVR) USING INSPIRIS VALVE SIZE 25MM (N/A Chest) TRANSESOPHAGEAL ECHOCARDIOGRAM (TEE) (N/A ) INDOCYANINE GREEN FLUORESCENCE IMAGING (ICG) (N/A )     Patient location during evaluation: SICU Anesthesia Type: General Level of consciousness: sedated, patient cooperative and oriented Pain management: pain level controlled Vital Signs Assessment: post-procedure vital signs reviewed and stable Respiratory status: spontaneous breathing, nonlabored ventilation, respiratory function stable and patient connected to nasal cannula oxygen (recently extubated) Cardiovascular status: stable (on low dose Phenylephrine) Postop Assessment: no apparent nausea or vomiting Anesthetic complications: no   No complications documented.  Last Vitals:  Vitals:   09/14/19 1800 09/14/19 1810  BP: 119/69   Pulse: 79 75  Resp: (!) 26 (!) 21  Temp: 37.2 C 37.3 C  SpO2: 98% 99%    Last Pain:  Vitals:   09/14/19 1725  TempSrc: Core  PainSc:                  Dean Obrien

## 2019-09-14 NOTE — Procedures (Signed)
Extubation Procedure Note  Patient Details:   Name: Dean Obrien DOB: 06/02/1960 MRN: [id number]   Airway Documentation:    Vent end date: 09/14/19 Vent end time: 1755   Evaluation  O2 sats: stable throughout Complications: No apparent complications Patient did tolerate procedure well. Bilateral Breath Sounds: Clear, Diminished   Yes   NIF -40 VC 1.3L Pt was extubated to 5L [place] per SIMV wean protocol. Pt able to speak after extubation. Pt was suctioned prior and had a positive cuff leak before procedure. Pt saturations are 98% and no stridor heard at this time. RT will continue to monitor.   Shaguana Love A Shogo Larkey 09/14/2019, 6:02 PM

## 2019-09-14 NOTE — Progress Notes (Signed)
Echocardiogram Echocardiogram Transesophageal has been performed.  Oneal Deputy Philemon Riedesel 09/14/2019, 8:54 AM

## 2019-09-14 NOTE — Transfer of Care (Signed)
Immediate Anesthesia Transfer of Care Note  Patient: Dean Obrien  Procedure(s) Performed: CORONARY ARTERY BYPASS GRAFTING (CABG) x Two using bilateral Internal mammary arteries (N/A Chest) AORTIC VALVE REPLACEMENT (AVR) USING INSPIRIS VALVE SIZE 25MM (N/A Chest) TRANSESOPHAGEAL ECHOCARDIOGRAM (TEE) (N/A ) INDOCYANINE GREEN FLUORESCENCE IMAGING (ICG) (N/A )  Patient Location: SICU  Anesthesia Type:General  Level of Consciousness: Patient remains intubated per anesthesia plan  Airway & Oxygen Therapy: Patient remains intubated per anesthesia plan and Patient placed on Ventilator (see vital sign flow sheet for setting)  Post-op Assessment: Report given to RN, Post -op Vital signs reviewed and stable and Patient moving all extremities  Post vital signs: Reviewed and stable  Last Vitals:  Vitals Value Taken Time  BP 91/64 09/14/19 1330  Temp 34.6 C 09/14/19 1336  Pulse 71 09/14/19 1336  Resp 12 09/14/19 1336  SpO2 99 % 09/14/19 1336  Vitals shown include unvalidated device data.  Last Pain:  Vitals:   09/14/19 0413  TempSrc: Oral  PainSc:       Patients Stated Pain Goal: 0 (74/25/95 6387)  Complications: No complications documented.

## 2019-09-14 NOTE — Anesthesia Procedure Notes (Signed)
Central Venous Catheter Insertion Performed by: Annye Asa, MD, anesthesiologist Start/End8/24/2021 6:41 AM, 09/14/2019 6:57 AM Patient location: Pre-op. Preanesthetic checklist: patient identified, IV checked, risks and benefits discussed, surgical consent, monitors and equipment checked, pre-op evaluation, timeout performed and anesthesia consent Position: supine Lidocaine 1% used for infiltration and patient sedated Hand hygiene performed , maximum sterile barriers used  and Seldinger technique used Catheter size: 8.5 Fr PA cath was placed.Sheath introducer Swan type:thermodilution Procedure performed using ultrasound guided technique. Ultrasound Notes:anatomy identified, needle tip was noted to be adjacent to the nerve/plexus identified, no ultrasound evidence of intravascular and/or intraneural injection and image(s) printed for medical record Attempts: 1 Following insertion, line sutured, dressing applied and Biopatch. Post procedure assessment: blood return through all ports, free fluid flow and no air  Patient tolerated the procedure well with no immediate complications. Additional procedure comments: PA catheter:  Routine monitors. Timeout, sterile prep, drape, FBP R neck.  Supine position.  1% Lido local, finder and trocar RIJ 1st pass with US guidance.  Cordis placed over J wire. PA catheter in easily.  Sterile dressing applied.  Patient tolerated well, VSS.  Jenita Seashore, MD.

## 2019-09-14 NOTE — Progress Notes (Signed)
Patient ID: Dean Obrien, male   DOB: [birth year], 59 y.o.   MRN: [id number]  TCTS Evening Rounds:   Hemodynamically stable  CI = 2.2  Cold when he got back. Now 36.1 Has started to wake up on vent.   Urine output good  CT output 70/hr  CBC    Component Value Date/Time   WBC 13.7 (H) 09/14/2019 1342   RBC 3.49 (L) 09/14/2019 1342   HGB 11.1 (L) 09/14/2019 1342   HCT 33.0 (L) 09/14/2019 1342   PLT 69 (L) 09/14/2019 1342   MCV 94.6 09/14/2019 1342   MCH 31.8 09/14/2019 1342   MCHC 33.6 09/14/2019 1342   RDW 13.1 09/14/2019 1342   RDW 13.1 06/29/2013 1513   LYMPHSABS 2.4 06/29/2013 1513   EOSABS 0.3 06/29/2013 1513   BASOSABS 0.0 06/29/2013 1513     BMET    Component Value Date/Time   NA 144 09/14/2019 1238   K 4.1 09/14/2019 1238   CL 104 09/14/2019 1233   CO2 25 09/14/2019 0608   GLUCOSE 108 (H) 09/14/2019 1233   BUN 5 (L) 09/14/2019 1233   CREATININE 0.40 (L) 09/14/2019 1233   CALCIUM 9.3 09/14/2019 0608   GFRNONAA >60 09/14/2019 0608   GFRAA >60 09/14/2019 2297     A/P:  Stable postop course. Continue current plans. Plt ct was 74K in OR and received 1 unit. Plt ct 69K in ICU after that.  Will give him another unit plts.

## 2019-09-14 NOTE — OR Nursing (Signed)
09/14/2019 - 10:35 a.m. Blood is processed and centrifuged by perfusion to obtain platelet rich plasma PRP and Platelet poor plasma PPP. Each product is drawn up in a different syringe and transferred to the sterile field. 1st - A mixture of 68m of 10% Calcium Chloride Injection 0.5g/569m(exp. 02/21/2021 Lot: [id number] be combined with all of the Thrombin 5,000 units powder only (no added saline from kit) Thrombin exp. 08/20/2020, Lot: : [id number] - The Scrub person will mix 10cc of PRP with a 1CC of the Thrombin/Calcium Chloride mixture in one applicator syringe. 3rd - The scrub person will mix 10cc of PPP 1cc of the Thrombin/Calcium Chloride mixture on the second applicator syringe.  These mixtures are applied to sternal edges, leg tunnel, and/or arm incision depending on the need.

## 2019-09-14 NOTE — Op Note (Signed)
CARDIOTHORACIC SURGERY OPERATIVE NOTE  Date of Procedure: 09/14/2019  Preoperative Diagnosis: Severe, symptomatic Aortic Stenosis and multivessel CAD  Postoperative Diagnosis: Same  Procedure:    Aortic Valve Replacement (25 mm Edwards Inspiris bioprosthetic)  Coronary Artery Bypass Grafting x 2  Left Internal Mammary Artery to Distal Left Anterior Descending Coronary Artery; pedicled RIMA to distal RCA Bilateral IMA harvesting Multilevel rib block with exparel solution   Surgeon: B. Murvin Natal, MD  Assistant: Josie Saunders, PA-C  Anesthesia: get  Operative Findings:  Preserved left ventricular systolic function  Good quality  internal mammary artery conduits  Good quality target vessels for grafting    BRIEF CLINICAL NOTE AND INDICATIONS FOR SURGERY  59 yo man presented with new-onset syncope. His evaluation demonstrated heart murmur and echocardiogram demonstrated severe aortic valve stenosis. He underwent LHC showing 2V CAD and he is taken to the OR for AVR/CABG, having been evaluated thoroughly preoperatively.    DETAILS OF THE OPERATIVE PROCEDURE  Preparation:  The patient is brought to the operating room on the above mentioned date and central monitoring was established by the anesthesia team including placement of Swan-Ganz catheter and radial arterial line. The patient is placed in the supine position on the operating table.  Intravenous antibiotics are administered. General endotracheal anesthesia is induced uneventfully. A Foley catheter is placed.  Baseline transesophageal echocardiogram was performed.  Findings were notable for severe AS and preserved ventricular function  The patient's chest, abdomen, both groins, and both lower extremities are prepared and draped in a sterile manner. A time out procedure is performed.   Surgical Approach and Conduit Harvest:  A median sternotomy incision was performed and the left internal mammary artery is dissected  from the chest wall and prepared for bypass grafting. The left internal mammary artery is notably good quality conduit. Next, the right IMA was harvested in a standard fashion. Prior to dividing the IMA pedicles distally, full dose heparin was administered intravenously.  Following systemic heparinization, the internal mammary arteries were transected distally noted to have excellent flow. They were treated with papaverine and multilevel rib block was performed with Exparel solution.    Extracorporeal Cardiopulmonary Bypass and Myocardial Protection:  The pericardium is opened. The ascending aorta is mildly dilated in appearance. The ascending aorta and the right atrium are cannulated for cardiopulmonary bypass.  Adequate heparinization is verified.   A retrograde cardioplegia cannula is placed through the right atrium into the coronary sinus.  Cardiopulmonary bypass was begun and the surface of the heart is inspected. Distal target vessels are selected for coronary artery bypass grafting. A cardioplegia cannula is placed in the ascending aorta.  A  The patient is allowed to cool passively to 32 degrees C systemic temperature.  The aortic cross clamp is applied and cold blood cardioplegia is delivered initially in an antegrade fashion through the aortic root. Supplemental cardioplegia is given retrograde through the coronary sinus catheter.  Iced saline slush is applied for topical hypothermia.  The initial cardioplegic arrest is rapid with early diastolic arrest.  Repeat doses of cardioplegia are administered intermittently throughout the entire cross clamp portion of the operation through the aortic root and through the coronary sinus catheter in order to maintain completely flat electrocardiogram.   Coronary Artery Bypass Grafting:   The distal right coronary artery was grafted using the pedicled RIMA graft in an end-to-side fashion.  At the site of distal anastomosis the target vessel was good  quality and measured approximately 1.5 mm in diameter.  The distal left anterior coronary artery was grafted with the left internal mammary artery in an end-to-side fashion.  At the site of distal anastomosis the target vessel was good quality and measured approximately 1.5 mm in diameter.  Aortic Valve Replacement: The aorta was opened transversely. The aortic valve was inspected; it was bicuspid and heavily calcified. The valve leaflets were excised and the annulus thoroughly debrided. Inspiris valve sizers were used to measure the annulus at 25 mm. Circumferential pledgeted sutures were placed in a noneverting fashion and then brought through the sewing cuff of the 25 mm Inspiris valve. The valve was lowered into position and the sutures tied with a Cor-knot crimping device. The aortotomy was closed in layers. A hot shot dose of cardioplegia was given; deairing procedures were performed and the aortic cross clamp was removed.    Procedure Completion:  All proximal and distal coronary anastomoses were inspected for hemostasis and appropriate graft orientation. Epicardial pacing wires are fixed to the right ventricular outflow tract and to the right atrial appendage. The patient is rewarmed to 37C temperature. The patient is weaned and disconnected from cardiopulmonary bypass.  The patient's rhythm at separation from bypass was NSR.  The patient was weaned from cardiopulmonary bypass without any inotropic support. T  Followup transesophageal echocardiogram performed after separation from bypass revealed no changes from the preoperative exam.  The aortic and venous cannula were removed uneventfully. Protamine was administered to reverse the anticoagulation. The mediastinum and pleural space were inspected for hemostasis and irrigated with saline solution. The mediastinum and both pleural space were drained using fluted chest tubes placed through separate stab incisions inferiorly.  The soft tissues  anterior to the aorta were reapproximated loosely. The sternum is closed with double strength sternal wire. The soft tissues anterior to the sternum were closed in multiple layers and the skin is closed with a running subcuticular skin closure.  The post-bypass portion of the operation was notable for stable rhythm and hemodynamics.  No blood products were administered during the operation.   Disposition:  The patient tolerated the procedure well and is transported to the surgical intensive care in stable condition. There are no intraoperative complications. All sponge instrument and needle counts are verified correct at completion of the operation.    Jayme Cloud, MD 09/14/2019 5:34 PM

## 2019-09-14 NOTE — Anesthesia Procedure Notes (Signed)
Arterial Line Insertion Start/End8/24/2021 6:45 AM, 09/14/2019 6:50 AM Performed by: Amadeo Garnet, CRNA, CRNA  Patient location: Pre-op. Preanesthetic checklist: patient identified, IV checked, site marked, risks and benefits discussed, surgical consent, monitors and equipment checked, pre-op evaluation, timeout performed and anesthesia consent Lidocaine 1% used for infiltration and patient sedated Right, radial was placed Catheter size: 20 G Hand hygiene performed  and maximum sterile barriers used   Attempts: 1 Procedure performed without using ultrasound guided technique. Following insertion, dressing applied and Biopatch. Post procedure assessment: normal  Patient tolerated the procedure well with no immediate complications.

## 2019-09-15 ENCOUNTER — Inpatient Hospital Stay (HOSPITAL_COMMUNITY)

## 2019-09-15 ENCOUNTER — Encounter (HOSPITAL_COMMUNITY): Payer: Self-pay | Admitting: Cardiothoracic Surgery

## 2019-09-15 LAB — BASIC METABOLIC PANEL
Anion gap: 7 (ref 5–15)
Anion gap: 6 (ref 5–15)
BUN: 8 mg/dL (ref 6–20)
BUN: 9 mg/dL (ref 6–20)
CO2: 23 mmol/L (ref 22–32)
CO2: 25 mmol/L (ref 22–32)
Calcium: 8 mg/dL — ABNORMAL LOW (ref 8.9–10.3)
Calcium: 8.1 mg/dL — ABNORMAL LOW (ref 8.9–10.3)
Chloride: 111 mmol/L (ref 98–111)
Chloride: 107 mmol/L (ref 98–111)
Creatinine, Ser: 0.66 mg/dL (ref 0.61–1.24)
Creatinine, Ser: 0.59 mg/dL — ABNORMAL LOW (ref 0.61–1.24)
GFR calc Af Amer: >60 mL/min (ref >60)
GFR calc Af Amer: >60 mL/min (ref >60)
GFR calc non Af Amer: >60 mL/min (ref >60)
GFR calc non Af Amer: >60 mL/min (ref >60)
Glucose, Bld: 134 mg/dL — ABNORMAL HIGH (ref 70–99)
Glucose, Bld: 144 mg/dL — ABNORMAL HIGH (ref 70–99)
Potassium: 4.5 mmol/L (ref 3.5–5.1)
Potassium: 4.1 mmol/L (ref 3.5–5.1)
Sodium: 141 mmol/L (ref 135–145)
Sodium: 138 mmol/L (ref 135–145)

## 2019-09-15 LAB — CBC
HCT: 31.2 % — ABNORMAL LOW (ref 39.0–52.0)
HCT: 29.8 % — ABNORMAL LOW (ref 39.0–52.0)
Hemoglobin: 10.3 g/dL — ABNORMAL LOW (ref 13.0–17.0)
Hemoglobin: 9.7 g/dL — ABNORMAL LOW (ref 13.0–17.0)
MCH: 31.6 pg (ref 26.0–34.0)
MCH: 31.3 pg (ref 26.0–34.0)
MCHC: 33 g/dL (ref 30.0–36.0)
MCHC: 32.6 g/dL (ref 30.0–36.0)
MCV: 95.7 fL (ref 80.0–100.0)
MCV: 96.1 fL (ref 80.0–100.0)
Platelets: 142 10*3/uL — ABNORMAL LOW (ref 150–400)
Platelets: 111 10*3/uL — ABNORMAL LOW (ref 150–400)
RBC: 3.26 MIL/uL — ABNORMAL LOW (ref 4.22–5.81)
RBC: 3.1 MIL/uL — ABNORMAL LOW (ref 4.22–5.81)
RDW: 13.2 % (ref 11.5–15.5)
RDW: 13.3 % (ref 11.5–15.5)
WBC: 14.3 10*3/uL — ABNORMAL HIGH (ref 4.0–10.5)
WBC: 17.4 10*3/uL — ABNORMAL HIGH (ref 4.0–10.5)
nRBC: 0 % (ref 0.0–0.2)
nRBC: 0 % (ref 0.0–0.2)

## 2019-09-15 LAB — PREPARE PLATELET PHERESIS
Unit division: 0
Unit division: 0

## 2019-09-15 LAB — GLUCOSE, CAPILLARY
Glucose-Capillary: 101 mg/dL — ABNORMAL HIGH (ref 70–99)
Glucose-Capillary: 122 mg/dL — ABNORMAL HIGH (ref 70–99)
Glucose-Capillary: 125 mg/dL — ABNORMAL HIGH (ref 70–99)
Glucose-Capillary: 134 mg/dL — ABNORMAL HIGH (ref 70–99)
Glucose-Capillary: 149 mg/dL — ABNORMAL HIGH (ref 70–99)
Glucose-Capillary: 154 mg/dL — ABNORMAL HIGH (ref 70–99)

## 2019-09-15 LAB — BPAM PLATELET PHERESIS
Blood Product Expiration Date: 202108262359
Blood Product Expiration Date: 202108262359
ISSUE DATE / TIME: 202108241242
ISSUE DATE / TIME: 202108241643
Unit Type and Rh: 6200
Unit Type and Rh: 6200

## 2019-09-15 LAB — MAGNESIUM
Magnesium: 2.2 mg/dL (ref 1.7–2.4)
Magnesium: 2.2 mg/dL (ref 1.7–2.4)

## 2019-09-15 LAB — SURGICAL PATHOLOGY

## 2019-09-15 MED ORDER — KETOROLAC TROMETHAMINE 15 MG/ML IJ SOLN
7.5000 mg | Freq: Four times a day (QID) | INTRAMUSCULAR | Status: AC
  Administered 2019-09-15 – 2019-09-16 (×3): 7.5 mg via INTRAVENOUS
  Filled 2019-09-15 (×3): qty 1

## 2019-09-15 MED ORDER — METOCLOPRAMIDE HCL 5 MG/ML IJ SOLN
5.0000 mg | Freq: Four times a day (QID) | INTRAMUSCULAR | Status: AC
  Administered 2019-09-15 (×3): 5 mg via INTRAVENOUS
  Filled 2019-09-15 (×3): qty 2

## 2019-09-15 MED ORDER — DIAZEPAM 2 MG PO TABS
2.0000 mg | ORAL_TABLET | Freq: Three times a day (TID) | ORAL | Status: DC
  Administered 2019-09-15 – 2019-09-16 (×5): 2 mg via ORAL
  Filled 2019-09-15 (×6): qty 1

## 2019-09-15 MED ORDER — KETOROLAC TROMETHAMINE 15 MG/ML IJ SOLN
7.5000 mg | Freq: Four times a day (QID) | INTRAMUSCULAR | Status: DC
  Administered 2019-09-15 (×2): 7.5 mg via INTRAVENOUS
  Filled 2019-09-15 (×3): qty 1

## 2019-09-15 MED ORDER — THIAMINE HCL 100 MG/ML IJ SOLN
Freq: Once | INTRAVENOUS | Status: AC
  Filled 2019-09-15: qty 1000

## 2019-09-15 MED FILL — Heparin Sodium (Porcine) Inj 1000 Unit/ML: INTRAMUSCULAR | Qty: 30 | Status: AC

## 2019-09-15 MED FILL — Potassium Chloride Inj 2 mEq/ML: INTRAVENOUS | Qty: 40 | Status: AC

## 2019-09-15 MED FILL — Magnesium Sulfate Inj 50%: INTRAMUSCULAR | Qty: 10 | Status: AC

## 2019-09-15 NOTE — Addendum Note (Signed)
Addendum  created 09/15/19 1054 by Josephine Igo, CRNA   Order list changed

## 2019-09-15 NOTE — Progress Notes (Signed)
1 Day Post-Op Procedure(s) (LRB): CORONARY ARTERY BYPASS GRAFTING (CABG) x Two using bilateral Internal mammary arteries (N/A) AORTIC VALVE REPLACEMENT (AVR) USING INSPIRIS VALVE SIZE 25MM (N/A) TRANSESOPHAGEAL ECHOCARDIOGRAM (TEE) (N/A) INDOCYANINE GREEN FLUORESCENCE IMAGING (ICG) (N/A) Subjective: nauseated  Objective: Vital signs in last 24 hours: Temp:  [94.6 F (34.8 C)-99.1 F (37.3 C)] 97.9 F (36.6 C) (08/25 1113) Pulse Rate:  [68-85] 82 (08/25 1200) Cardiac Rhythm: Normal sinus rhythm (08/25 0800) Resp:  [0-27] 20 (08/25 1200) BP: (91-146)/(50-76) 146/76 (08/25 1200) SpO2:  [93 %-100 %] 98 % (08/25 1200) Arterial Line BP: (76-304)/(45-197) 76/55 (08/25 0800) FiO2 (%):  [40 %-50 %] 40 % (08/24 1728) Weight:  [68.1 kg] 68.1 kg (08/25 0451)  Hemodynamic parameters for last 24 hours: PAP: (12-33)/(1-13) 26/9 CO:  [3 L/min-5.1 L/min] 5.1 L/min CI:  [1.8 L/min/m2-3.1 L/min/m2] 3.1 L/min/m2  Intake/Output from previous day: 08/24 0701 - 08/25 0700 In: 6529.8 [I.V.:3943.7; Blood:1151; IV Piggyback:1435.1] Out: 0569 [Urine:4960; Chest Tube:1070] Intake/Output this shift: Total I/O In: 131.2 [I.V.:131.2] Out: 400 [Urine:250; Chest Tube:150]  General appearance: alert and cooperative Neurologic: intact Heart: regular rate and rhythm, S1, S2 normal, no murmur, click, rub or gallop Lungs: clear to auscultation bilaterally Abdomen: soft, non-tender; bowel sounds normal; no masses,  no organomegaly Extremities: edema 1+ Wound: dressed  Lab Results: Recent Labs    09/14/19 1934 09/14/19 1934 09/14/19 1936 09/15/19 0243  WBC 11.9*  --   --  14.3*  HGB 10.0*   < > 9.2* 10.3*  HCT 30.6*   < > 27.0* 31.2*  PLT 141*  --   --  142*   < > = values in this interval not displayed.   BMET:  Recent Labs    09/14/19 1934 09/14/19 1934 09/14/19 1936 09/15/19 0243  NA 143   < > 145 141  K 4.3   < > 4.3 4.5  CL 112*  --   --  111  CO2 23  --   --  23  GLUCOSE 125*  --    --  134*  BUN 7  --   --  8  CREATININE 0.69  --   --  0.66  CALCIUM 7.8*  --   --  8.0*   < > = values in this interval not displayed.    PT/INR:  Recent Labs    09/14/19 1342  LABPROT 19.3*  INR 1.7*   ABG    Component Value Date/Time   PHART 7.312 (L) 09/14/2019 1936   HCO3 24.1 09/14/2019 1936   TCO2 26 09/14/2019 1936   ACIDBASEDEF 2.0 09/14/2019 1936   O2SAT 99.0 09/14/2019 1936   CBG (last 3)  Recent Labs    09/15/19 0440 09/15/19 0802 09/15/19 1115  GLUCAP 122* 134* 125*    Assessment/Plan: S/P Procedure(s) (LRB): CORONARY ARTERY BYPASS GRAFTING (CABG) x Two using bilateral Internal mammary arteries (N/A) AORTIC VALVE REPLACEMENT (AVR) USING INSPIRIS VALVE SIZE 25MM (N/A) TRANSESOPHAGEAL ECHOCARDIOGRAM (TEE) (N/A) INDOCYANINE GREEN FLUORESCENCE IMAGING (ICG) (N/A) out of bed  D/c PA catheter Leave chest tubes   LOS: 5 days    Wonda Olds 09/15/2019

## 2019-09-16 ENCOUNTER — Inpatient Hospital Stay (HOSPITAL_COMMUNITY)

## 2019-09-16 LAB — BASIC METABOLIC PANEL
Anion gap: 8 (ref 5–15)
BUN: 9 mg/dL (ref 6–20)
CO2: 24 mmol/L (ref 22–32)
Calcium: 8.7 mg/dL — ABNORMAL LOW (ref 8.9–10.3)
Chloride: 107 mmol/L (ref 98–111)
Creatinine, Ser: 0.66 mg/dL (ref 0.61–1.24)
GFR calc Af Amer: >60 mL/min (ref >60)
GFR calc non Af Amer: >60 mL/min (ref >60)
Glucose, Bld: 120 mg/dL — ABNORMAL HIGH (ref 70–99)
Potassium: 4.7 mmol/L (ref 3.5–5.1)
Sodium: 139 mmol/L (ref 135–145)

## 2019-09-16 LAB — CBC
HCT: 29.2 % — ABNORMAL LOW (ref 39.0–52.0)
Hemoglobin: 9.5 g/dL — ABNORMAL LOW (ref 13.0–17.0)
MCH: 31.1 pg (ref 26.0–34.0)
MCHC: 32.5 g/dL (ref 30.0–36.0)
MCV: 95.7 fL (ref 80.0–100.0)
Platelets: 117 10*3/uL — ABNORMAL LOW (ref 150–400)
RBC: 3.05 MIL/uL — ABNORMAL LOW (ref 4.22–5.81)
RDW: 13.6 % (ref 11.5–15.5)
WBC: 18.2 10*3/uL — ABNORMAL HIGH (ref 4.0–10.5)
nRBC: 0 % (ref 0.0–0.2)

## 2019-09-16 LAB — GLUCOSE, CAPILLARY
Glucose-Capillary: 108 mg/dL — ABNORMAL HIGH (ref 70–99)
Glucose-Capillary: 111 mg/dL — ABNORMAL HIGH (ref 70–99)
Glucose-Capillary: 123 mg/dL — ABNORMAL HIGH (ref 70–99)
Glucose-Capillary: 124 mg/dL — ABNORMAL HIGH (ref 70–99)
Glucose-Capillary: 136 mg/dL — ABNORMAL HIGH (ref 70–99)
Glucose-Capillary: 162 mg/dL — ABNORMAL HIGH (ref 70–99)
Glucose-Capillary: 179 mg/dL — ABNORMAL HIGH (ref 70–99)

## 2019-09-16 MED ORDER — AMIODARONE HCL IN DEXTROSE 360-4.14 MG/200ML-% IV SOLN
30.0000 mg/h | INTRAVENOUS | Status: DC
  Administered 2019-09-17 (×2): 30 mg/h via INTRAVENOUS
  Filled 2019-09-16 (×3): qty 200

## 2019-09-16 MED ORDER — METOPROLOL TARTRATE 25 MG/10 ML ORAL SUSPENSION: 12.5000 mg | Freq: Three times a day (TID) | ORAL | Status: DC

## 2019-09-16 MED ORDER — AMIODARONE HCL IN DEXTROSE 360-4.14 MG/200ML-% IV SOLN
60.0000 mg/h | INTRAVENOUS | Status: DC
  Administered 2019-09-16 (×2): 60 mg/h via INTRAVENOUS
  Filled 2019-09-16: qty 400

## 2019-09-16 MED ORDER — AMIODARONE LOAD VIA INFUSION
150.0000 mg | Freq: Once | INTRAVENOUS | Status: AC
  Administered 2019-09-16: 150 mg via INTRAVENOUS
  Filled 2019-09-16: qty 83.34

## 2019-09-16 MED ORDER — MAGNESIUM OXIDE 400 (241.3 MG) MG PO TABS
400.0000 mg | ORAL_TABLET | Freq: Two times a day (BID) | ORAL | Status: DC
  Administered 2019-09-16 – 2019-09-19 (×6): 400 mg via ORAL
  Filled 2019-09-16 (×6): qty 1

## 2019-09-16 MED ORDER — ASPIRIN EC 81 MG PO TBEC
81.0000 mg | DELAYED_RELEASE_TABLET | Freq: Every day | ORAL | Status: DC
  Administered 2019-09-16 – 2019-09-19 (×4): 81 mg via ORAL
  Filled 2019-09-16 (×4): qty 1

## 2019-09-16 MED ORDER — CLOPIDOGREL BISULFATE 75 MG PO TABS
75.0000 mg | ORAL_TABLET | Freq: Every day | ORAL | Status: DC
  Administered 2019-09-16 – 2019-09-19 (×4): 75 mg via ORAL
  Filled 2019-09-16 (×4): qty 1

## 2019-09-16 MED ORDER — FUROSEMIDE 10 MG/ML IJ SOLN
40.0000 mg | Freq: Two times a day (BID) | INTRAMUSCULAR | Status: DC
  Administered 2019-09-16 – 2019-09-18 (×4): 40 mg via INTRAVENOUS
  Filled 2019-09-16 (×4): qty 4

## 2019-09-16 MED ORDER — COLCHICINE 0.3 MG HALF TABLET
0.3000 mg | ORAL_TABLET | Freq: Two times a day (BID) | ORAL | Status: DC
  Administered 2019-09-16: 0.3 mg via ORAL
  Filled 2019-09-16 (×4): qty 1

## 2019-09-16 MED ORDER — METOPROLOL TARTRATE 12.5 MG HALF TABLET
12.5000 mg | ORAL_TABLET | Freq: Three times a day (TID) | ORAL | Status: DC
  Administered 2019-09-16 – 2019-09-18 (×8): 12.5 mg via ORAL
  Filled 2019-09-16 (×8): qty 1

## 2019-09-16 NOTE — Progress Notes (Signed)
2 Days Post-Op Procedure(s) (LRB): CORONARY ARTERY BYPASS GRAFTING (CABG) x Two using bilateral Internal mammary arteries (N/A) AORTIC VALVE REPLACEMENT (AVR) USING INSPIRIS VALVE SIZE 25MM (N/A) TRANSESOPHAGEAL ECHOCARDIOGRAM (TEE) (N/A) INDOCYANINE GREEN FLUORESCENCE IMAGING (ICG) (N/A) Subjective: Feeling better  Objective: Vital signs in last 24 hours: Temp:  [97.7 F (36.5 C)-98.2 F (36.8 C)] 97.9 F (36.6 C) (08/26 0808) Pulse Rate:  [66-93] 93 (08/26 0700) Cardiac Rhythm: Normal sinus rhythm;Atrial paced (08/26 0000) Resp:  [0-23] 23 (08/26 0700) BP: (115-146)/(61-78) 143/75 (08/26 0700) SpO2:  [95 %-100 %] 95 % (08/26 0700) Weight:  [68 kg] 68 kg (08/26 0500)  Hemodynamic parameters for last 24 hours:    Intake/Output from previous day: 08/25 0701 - 08/26 0700 In: 1703.9 [P.O.:500; I.V.:1004; IV Piggyback:199.9] Out: 1285 [Urine:845; Chest Tube:440] Intake/Output this shift: No intake/output data recorded.  General appearance: alert and cooperative Neurologic: intact Heart: regular rate and rhythm, S1, S2 normal, no murmur, click, rub or gallop Lungs: clear to auscultation bilaterally Abdomen: soft, non-tender; bowel sounds normal; no masses,  no organomegaly Extremities: edema mild Wound: c/d/i  Lab Results: Recent Labs    09/15/19 1635 09/16/19 0050  WBC 17.4* 18.2*  HGB 9.7* 9.5*  HCT 29.8* 29.2*  PLT 111* 117*   BMET:  Recent Labs    09/15/19 1635 09/16/19 0050  NA 138 139  K 4.1 4.7  CL 107 107  CO2 25 24  GLUCOSE 144* 120*  BUN 9 9  CREATININE 0.59* 0.66  CALCIUM 8.1* 8.7*    PT/INR:  Recent Labs    09/14/19 1342  LABPROT 19.3*  INR 1.7*   ABG    Component Value Date/Time   PHART 7.312 (L) 09/14/2019 1936   HCO3 24.1 09/14/2019 1936   TCO2 26 09/14/2019 1936   ACIDBASEDEF 2.0 09/14/2019 1936   O2SAT 99.0 09/14/2019 1936   CBG (last 3)  Recent Labs    09/16/19 0502 09/16/19 0650 09/16/19 0812  GLUCAP 124* 136* 179*     Assessment/Plan: S/P Procedure(s) (LRB): CORONARY ARTERY BYPASS GRAFTING (CABG) x Two using bilateral Internal mammary arteries (N/A) AORTIC VALVE REPLACEMENT (AVR) USING INSPIRIS VALVE SIZE 25MM (N/A) TRANSESOPHAGEAL ECHOCARDIOGRAM (TEE) (N/A) INDOCYANINE GREEN FLUORESCENCE IMAGING (ICG) (N/A) oob to chair  PT/OT Will likely diurese later today    LOS: 6 days    Dean Obrien 09/16/2019

## 2019-09-16 NOTE — Progress Notes (Signed)
EVENING ROUNDS NOTE :     [address]       [place] [address]             [phone]                 2 Days Post-Op Procedure(s) (LRB): CORONARY ARTERY BYPASS GRAFTING (CABG) x Two using bilateral Internal mammary arteries (N/A) AORTIC VALVE REPLACEMENT (AVR) USING INSPIRIS VALVE SIZE 25MM (N/A) TRANSESOPHAGEAL ECHOCARDIOGRAM (TEE) (N/A) INDOCYANINE GREEN FLUORESCENCE IMAGING (ICG) (N/A)   Total Length of Stay:  LOS: 6 days  Events:   Afib, rate controlled, on amio gtt Stable BP    BP (!) 154/74   Pulse 91   Temp 97.7 F (36.5 C) (Oral)   Resp 16   Ht 5\' 3"  (1.6 m)   Wt 68 kg   SpO2 99%   BMI 26.55 kg/m         . sodium chloride    . amiodarone 60 mg/hr (09/16/19 1745)   Followed by  . amiodarone    . insulin Stopped (09/14/19 1702)  . lactated ringers      I/O last 3 completed shifts: In: 2553.7 [P.O.:500; I.V.:1503.8; IV Piggyback:550] Out: 2700 [XBMWU:1324; Chest Tube:1010]   CBC Latest Ref Rng & Units 09/16/2019 09/15/2019 09/15/2019  WBC 4.0 - 10.5 K/uL 18.2(H) 17.4(H) 14.3(H)  Hemoglobin 13.0 - 17.0 g/dL 9.5(L) 9.7(L) 10.3(L)  Hematocrit 39 - 52 % 29.2(L) 29.8(L) 31.2(L)  Platelets 150 - 400 K/uL 117(L) 111(L) 142(L)    BMP Latest Ref Rng & Units 09/16/2019 09/15/2019 09/15/2019  Glucose 70 - 99 mg/dL 120(H) 144(H) 134(H)  BUN 6 - 20 mg/dL 9 9 8   Creatinine 0.61 - 1.24 mg/dL 0.66 0.59(L) 0.66  Sodium 135 - 145 mmol/L 139 138 141  Potassium 3.5 - 5.1 mmol/L 4.7 4.1 4.5  Chloride 98 - 111 mmol/L 107 107 111  CO2 22 - 32 mmol/L 24 25 23   Calcium 8.9 - 10.3 mg/dL 8.7(L) 8.1(L) 8.0(L)    ABG    Component Value Date/Time   PHART 7.312 (L) 09/14/2019 1936   PCO2ART 47.7 09/14/2019 1936   PO2ART 175 (H) 09/14/2019 1936   HCO3 24.1 09/14/2019 1936   TCO2 26 09/14/2019 1936   ACIDBASEDEF 2.0 09/14/2019 1936   O2SAT 99.0 09/14/2019 1936       Melodie Bouillon, MD 09/16/2019 6:11 PM

## 2019-09-16 NOTE — Progress Notes (Signed)
Amiodarone Drug - Drug Interaction Consult Note  Recommendations: Start IV amiodarone and continue current medications. Continue to monitor for drug interactions with new medications.  Amiodarone is metabolized by the cytochrome P450 system and therefore has the potential to cause many drug interactions. Amiodarone has an average plasma half-life of 50 days (range 20 to 100 days).   There is potential for drug interactions to occur several weeks or months after stopping treatment and the onset of drug interactions may be slow after initiating amiodarone.   [x]  Statins: Increased risk of myopathy. Simvastatin- restrict dose to 20mg  daily. Other statins: counsel patients to report any muscle pain or weakness immediately.  []  Anticoagulants: Amiodarone can increase anticoagulant effect. Consider warfarin dose reduction. Patients should be monitored closely and the dose of anticoagulant altered accordingly, remembering that amiodarone levels take several weeks to stabilize.  []  Antiepileptics: Amiodarone can increase plasma concentration of phenytoin, the dose should be reduced. Note that small changes in phenytoin dose can result in large changes in levels. Monitor patient and counsel on signs of toxicity.  []  Beta blockers: increased risk of bradycardia, AV block and myocardial depression. Sotalol - avoid concomitant use.  []   Calcium channel blockers (diltiazem and verapamil): increased risk of bradycardia, AV block and myocardial depression.  []   Cyclosporine: Amiodarone increases levels of cyclosporine. Reduced dose of cyclosporine is recommended.  []  Digoxin dose should be halved when amiodarone is started.  []  Diuretics: increased risk of cardiotoxicity if hypokalemia occurs.  []  Oral hypoglycemic agents (glyburide, glipizide, glimepiride): increased risk of hypoglycemia. Patient's glucose levels should be monitored closely when initiating amiodarone therapy.   []  Drugs that prolong the  QT interval:  Torsades de pointes risk may be increased with concurrent use - avoid if possible.  Monitor QTc, also keep magnesium/potassium WNL if concurrent therapy can't be avoided. Marland Kitchen Antibiotics: e.g. fluoroquinolones, erythromycin. . Antiarrhythmics: e.g. quinidine, procainamide, disopyramide, sotalol. . Antipsychotics: e.g. phenothiazines, haloperidol.  . Lithium, tricyclic antidepressants, and methadone.   Richardine Service, PharmD PGY2 Cardiology Pharmacy Resident Phone: [phone] 09/16/2019  1:44 PM   Please check [email or web address] for unit-specific pharmacy phone numbers.

## 2019-09-17 ENCOUNTER — Other Ambulatory Visit: Payer: Self-pay | Admitting: Medical

## 2019-09-17 ENCOUNTER — Inpatient Hospital Stay (HOSPITAL_COMMUNITY)

## 2019-09-17 DIAGNOSIS — I359 Nonrheumatic aortic valve disorder, unspecified: Secondary | ICD-10-CM

## 2019-09-17 LAB — CBC
HCT: 28.8 % — ABNORMAL LOW (ref 39.0–52.0)
Hemoglobin: 9.4 g/dL — ABNORMAL LOW (ref 13.0–17.0)
MCH: 31.1 pg (ref 26.0–34.0)
MCHC: 32.6 g/dL (ref 30.0–36.0)
MCV: 95.4 fL (ref 80.0–100.0)
Platelets: 122 10*3/uL — ABNORMAL LOW (ref 150–400)
RBC: 3.02 MIL/uL — ABNORMAL LOW (ref 4.22–5.81)
RDW: 13.4 % (ref 11.5–15.5)
WBC: 18.1 10*3/uL — ABNORMAL HIGH (ref 4.0–10.5)
nRBC: 0 % (ref 0.0–0.2)

## 2019-09-17 LAB — GLUCOSE, CAPILLARY
Glucose-Capillary: 105 mg/dL — ABNORMAL HIGH (ref 70–99)
Glucose-Capillary: 105 mg/dL — ABNORMAL HIGH (ref 70–99)
Glucose-Capillary: 115 mg/dL — ABNORMAL HIGH (ref 70–99)
Glucose-Capillary: 120 mg/dL — ABNORMAL HIGH (ref 70–99)
Glucose-Capillary: 120 mg/dL — ABNORMAL HIGH (ref 70–99)
Glucose-Capillary: 127 mg/dL — ABNORMAL HIGH (ref 70–99)
Glucose-Capillary: 98 mg/dL (ref 70–99)
Glucose-Capillary: 99 mg/dL (ref 70–99)

## 2019-09-17 LAB — BASIC METABOLIC PANEL
Anion gap: 9 (ref 5–15)
BUN: 12 mg/dL (ref 6–20)
CO2: 27 mmol/L (ref 22–32)
Calcium: 8.3 mg/dL — ABNORMAL LOW (ref 8.9–10.3)
Chloride: 100 mmol/L (ref 98–111)
Creatinine, Ser: 0.63 mg/dL (ref 0.61–1.24)
GFR calc Af Amer: >60 mL/min (ref >60)
GFR calc non Af Amer: >60 mL/min (ref >60)
Glucose, Bld: 113 mg/dL — ABNORMAL HIGH (ref 70–99)
Potassium: 3.5 mmol/L (ref 3.5–5.1)
Sodium: 136 mmol/L (ref 135–145)

## 2019-09-17 MED ORDER — POTASSIUM CHLORIDE 10 MEQ/50ML IV SOLN
10.0000 meq | INTRAVENOUS | Status: AC
  Administered 2019-09-17 (×3): 10 meq via INTRAVENOUS
  Filled 2019-09-17 (×3): qty 50

## 2019-09-17 MED ORDER — COLCHICINE 0.6 MG PO TABS
0.6000 mg | ORAL_TABLET | Freq: Two times a day (BID) | ORAL | Status: DC
  Administered 2019-09-17 – 2019-09-19 (×5): 0.6 mg via ORAL
  Filled 2019-09-17 (×5): qty 1

## 2019-09-17 NOTE — Progress Notes (Signed)
Patient had tachycardic episodes at 12:30 and 1430, each lasting 1.5 to 2 minutes with HR in the 150's-160's before returning to mid 70's. PA Tacy Dura paged and made aware. Metoprolol scheduled TID and Amiodarone drip at 76. 2C nurse also made aware.

## 2019-09-17 NOTE — Discharge Summary (Signed)
Physician Discharge Summary       [address]       [place] [address]             [phone]    Patient ID: Dean Obrien MRN: [id number] DOB/AGE: 1961-01-19 59 y.o.  Admit date: 09/10/2019 Discharge date: 09/19/2019  Admission Diagnoses: 1. Critical aortic valve stenosis 2. CAD (coronary artery disease)  Discharge Diagnoses:  1. S/p CABG x 2 and aortic valve replacement with bioprosthetic valve 2. Expected post op blood loss anemia 3. Post op atrial fibrillation 4.  7 mm nodule in LUL that will require a followup CT in 6 months 5. History of pre diabetes 6. History of HYPERTENSION, BENIGN 7. History of syncope 8. History of nicotine dependence 9. History of Hyperlipidemia LDL goal <70 10. History of diastolic dysfunction  Consults: None  Procedure (s):   Aortic Valve Replacement (25 mm Edwards Inspiris bioprosthetic)  Coronary Artery Bypass Grafting x 2             Left Internal Mammary Artery to Distal Left Anterior Descending Coronary Artery; pedicled RIMA to distal RCA Bilateral IMA harvesting Multilevel rib block with exparel solution by Dr. [name] on 09/14/2019.  History of Presenting Illness: The patient is a 59 year old gentleman with history of hypertension, hyperlipidemia, prediabetes, and a long history of heavy tobacco abuse who currently smokes about 1/2 pack of cigarettes per day who has a history of a systolic heart murmur that was felt to be due to aortic valve disease but the patient never had an echocardiogram due to being out of the insurance network for a physician he was seeing.  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  On 09/08/2019 he was outside in his yard vigorously cracking a chainsaw when he suddenly became very dizzy and acutely short of breath and nauseous.  He had some transient chest discomfort that he described as a burning tightness and  immediately went inside where he was very short of breath.  He got down on his hands and knees on the ground and believes he lost consciousness.  His daughter is with him today and said that he passed out for about 1 to 2 minutes.  He was taken to [facility] where electrocardiogram showed no acute ST-T changes.  He had mildly elevated high-sensitivity troponin at 48>132>165.  Head CT was unremarkable.  He was seen for cardiology consultation and underwent a echocardiogram showing critical aortic stenosis with a peak velocity of 5.5 m/s.  Aortic valve mean gradient was 85.  Left ventricular ejection fraction was 55 to 60%.  He underwent cardiac catheterization which showed 90% proximal RCA stenosis.  The LAD had a 50% mid vessel calcified stenosis over a couple centimeters.  The left circumflex had minimal proximal narrowing.  He was transferred to [facility] for surgical consultation.   There is no family history of aortic valve disease, aortic aneurysm, or connective tissue disorder.  The patient is here today with his daughter.  He still smokes about 1/2 pack of cigarettes per day.  He said that he has not been to the dentist in a couple years but has had several extractions about teeth in the past.    Orthopantogram showed no dental problems. CTA reviewed and shows probable bicuspid AV. The ascending aorta is mildly enlarged at 3.7 cm tapering to 3.4 cm in arch. Does not require replacement.  There is also a 7 mm nodule in LUL that will require a followup CT in 6 months in this heavy smoker.  Brief Hospital Course:  He was extubated the evening of surgery. He remained afebrile and hemodynamically stable. Gordy Councilman, a line and foley were removed early in the post operative course. He was weaned off Neo Syneprhine drip. He did have nausea which was resolved with Zofran PRN. Chest tubes remained for a few days then were removed as output decreased. He was started on low dose Lopressor. He  was started on Plavix 75 mg daily and baby ec asa as he was s/p NSTEMI. He was volume overloaded and diuresed. He was weaned off the Insulin drip. His pre op HGA1C was 5.8. He will need further surveillance by his medical doctor after discharge. PT and OT consults were obtained. He was felt surgically stable for transfer from the ICU to progressive care floor on 08/27. He went into a fib on 08/27 and was put on an Amiodarone drip. Lopressor was increased to 12.5 mg tid. He converted to sinus rhythm and remained in SR. Epicardial pacing wires were removed on 08/28. He was severely hypokalemic at 2.7 on 08/28. Supplementation was ordered accordingly. Repeat potassium later that day was 3.3. He was given more supplementation. On 08/29, potassium was 3.2 and again was supplemented accordingly. He was already on Mag Ox post op. He had expected post op blood loss anemia. His last H and H was 9.8 and 28.9. He has been tolerating a diet and has had a bowel movement. Lopressor was increased to 25 mg bid and he remained in sinus rhythm. All wounds are clean, dry, and healing without signs of infection. As discussed with Dr. Servando Snare, patient is surgically stable for discharge today. Note, echo has been arranged after discharged and is scheduled for 11/02/2019 at 9:30 am  Latest Vital Signs: Blood pressure 131/80, pulse 75, temperature 98.3 F (36.8 C), temperature source Oral, resp. rate 12, height 5\' 3"  (1.6 m), weight 65.5 kg, SpO2 97 %.  Physical Exam: Cardiovascular: RRR Pulmonary: Clear to auscultation bilaterally Abdomen: Soft, non tender, bowel sounds present. Extremities: No lower extremity edema. Wounds: Clean and dry.  No erythema or signs of infection. Dried blood top of sternal incsion   Discharge Condition:Stable and discharged to home.  Recent laboratory studies:  Lab Results  Component Value Date   WBC 13.0 (H) 09/19/2019   HGB 9.8 (L) 09/19/2019   HCT 28.9 (L) 09/19/2019   MCV 91.2  09/19/2019   PLT 194 09/19/2019   Lab Results  Component Value Date   NA 135 09/19/2019   K 3.2 (L) 09/19/2019   CL 99 09/19/2019   CO2 26 09/19/2019   CREATININE 0.61 09/19/2019   GLUCOSE 100 (H) 09/19/2019      Diagnostic Studies: DG Orthopantogram  Result Date: 09/11/2019 CLINICAL DATA:  Aortic stenosis, preoperative evaluation EXAM: ORTHOPANTOGRAM/PANORAMIC COMPARISON:  None. FINDINGS: Panorex view of the mandible demonstrates no acute or destructive bony lesions. Good dentition. Sinuses are clear. IMPRESSION: 1. Unremarkable mandible. Electronically Signed   By: Randa Ngo M.D.   On: 09/11/2019 18:49   DG Chest 1 View  Result Date: 09/18/2019 CLINICAL DATA:  History of open heart surgery EXAM: CHEST  1 VIEW COMPARISON:  September 17, 2019 FINDINGS: Stable cardiomegaly. Support apparatus is been removed. No pneumothorax. The hila and mediastinum are stable. No overt edema. No nodules, masses, or focal infiltrates. IMPRESSION: Interval removal of support apparatus. No overt  edema. No significant interval change otherwise seen. Electronically Signed   By: Dorise Bullion III M.D   On: 09/18/2019 15:56   DG Chest 1 View  Result Date: 09/17/2019 CLINICAL DATA:  Chest tube.  Open-heart surgery. EXAM: CHEST  1 VIEW COMPARISON:  09/16/1998 FINDINGS: Right IJ sheath, mediastinal drainage catheter, bilateral chest tubes in stable position. No pneumothorax. Prior cardiac valve replacement and CABG. Cardiomegaly. No pulmonary venous congestion. Bibasilar atelectasis, improved from prior exam. No pleural effusion. IMPRESSION: 1. Lines and tubes including bilateral chest tubes in stable position. No pneumothorax. 2.  Prior CABG and cardiac valve replacement.  Stable cardiomegaly 3.  Bibasilar atelectasis, improved from prior exam. Electronically Signed   By: Marcello Moores  Register   On: 09/17/2019 07:05   DG Chest 1 View  Result Date: 09/16/2019 CLINICAL DATA:  Chest tubes present post aortic valve  replacement. EXAM: CHEST  1 VIEW COMPARISON:  Chest x-ray 06/10/2019 FINDINGS: RIGHT IJ sheath remains in place. Swan-Ganz catheter since removed compared to the prior study. Post median sternotomy for aortic valve replacement with bilateral chest tubes and presumed mediastinal drain in place with unchanged appearance. Mild increased interstitial markings throughout with patchy basilar airspace opacities. Heart size/mediastinal contours are unchanged. On limited assessment skeletal structures without acute process. IMPRESSION: 1. Interval removal of Swan-Ganz catheter. 2. Persistent basilar atelectasis with slight improved aeration. Question of mild background pulmonary edema. 3. Postoperative changes of aortic valve replacement. Electronically Signed   By: Zetta Bills M.D.   On: 09/16/2019 07:57   DG Chest 2 View  Result Date: 09/19/2019 CLINICAL DATA:  Pleural effusion EXAM: CHEST - 2 VIEW COMPARISON:  September 18, 2019 FINDINGS: Stable cardiomegaly. The hila and mediastinum are unremarkable. Small pleural effusions are identified, particularly on the lateral view. No overt edema. No nodules, masses, or infiltrates. No pneumothorax. IMPRESSION: 1. Small pleural effusions remain, best seen on the lateral view. No overt edema. Electronically Signed   By: Dorise Bullion III M.D   On: 09/19/2019 08:32   DG Chest 2 View  Result Date: 09/08/2019 CLINICAL DATA:  Syncope. EXAM: CHEST - 2 VIEW COMPARISON:  September 17, 2011 FINDINGS: Mild, diffuse chronic appearing increased lung markings are seen without evidence of acute infiltrate, pleural effusion or pneumothorax. The heart size and mediastinal contours are within normal limits. The visualized skeletal structures are unremarkable. IMPRESSION: No active cardiopulmonary disease. Electronically Signed   By: Virgina Norfolk M.D.   On: 09/08/2019 18:59   CT Head Wo Contrast  Result Date: 09/09/2019 CLINICAL DATA:  Altered mental status.  Dizziness and nausea.  EXAM: CT HEAD WITHOUT CONTRAST TECHNIQUE: Contiguous axial images were obtained from the base of the skull through the vertex without intravenous contrast. COMPARISON:  None. FINDINGS: Brain: No intracranial hemorrhage, mass effect, or midline shift. No hydrocephalus. The basilar cisterns are patent. No evidence of territorial infarct or acute ischemia. No extra-axial or intracranial fluid collection. Vascular: No hyperdense vessel or unexpected calcification. Skull: No fracture or focal lesion. Sinuses/Orbits: Minimal opacification of a few left ethmoid air cells. The remaining paranasal sinuses are clear. Included orbits are unremarkable. Mastoid air cells are well aerated. Other: None. IMPRESSION: Negative noncontrast head CT. Electronically Signed   By: Keith Rake M.D.   On: 09/09/2019 00:11   CARDIAC CATHETERIZATION  Result Date: 09/10/2019  Prox Cx lesion is 20% stenosed.  Mid LAD lesion is 50% stenosed.  Prox RCA lesion is 90% stenosed.  1.  Significant one-vessel coronary artery disease with 90%  proximal stenosis in the right coronary artery.  Moderate mid LAD stenosis.  Moderate to heavy calcifications are noted. 2.  Severely calcified aortic valve with restricted opening.  I did not attempt to cross the aortic valve. 3.  Right heart catheterization showed normal filling pressures, normal pulmonary pressure and normal cardiac output. Recommendations: The patient has critical aortic stenosis and one-vessel coronary artery disease with associated syncope.  Given the patient's relatively young age, I suspect that he has bicuspid aortic valve. Recommend transfer to [facility] for evaluation of AVR and one-vessel CABG. Resume heparin 8 hours after sheath pull.   DG Chest Port 1 View  Result Date: 09/15/2019 CLINICAL DATA:  Status post cardiac surgery. EXAM: PORTABLE CHEST 1 VIEW COMPARISON:  September 14, 2019. FINDINGS: Stable cardiomediastinal silhouette. Status post aortic valve repair. Endotracheal  tube has been removed. Stable position of right internal jugular Swan-Ganz catheter with tip directed toward right pulmonary artery. Stable bilateral chest tubes are noted. No pneumothorax is noted. Mild bibasilar subsegmental atelectasis is noted. Bony thorax is unremarkable. IMPRESSION: Stable bilateral chest tubes are noted without pneumothorax. Mild bibasilar subsegmental atelectasis. Endotracheal tube has been removed. Electronically Signed   By: Marijo Conception M.D.   On: 09/15/2019 08:17   DG Chest Port 1 View  Result Date: 09/14/2019 CLINICAL DATA:  Status post aortic valve replacement today. EXAM: PORTABLE CHEST 1 VIEW COMPARISON:  PA and lateral chest 09/08/2019 and CT chest 09/12/2019. FINDINGS: Endotracheal tube is in place in good position with the tip at the level of the clavicular heads. Bilateral chest tubes and mediastinal drain are identified. Right IJ approach Swan-Ganz catheter tip is in the distal right main pulmonary artery. Lungs demonstrate emphysematous disease but are clear. No pneumothorax. Heart size is normal. IMPRESSION: Support tubes and lines projecting good position. Negative for pneumothorax or acute disease. Electronically Signed   By: Inge Rise M.D.   On: 09/14/2019 13:36   CT ANGIO CHEST AORTA W/CM & OR WO/CM  Result Date: 09/12/2019 CLINICAL DATA:  Aortic disease.  Aortic valve stenosis. EXAM: CT ANGIOGRAPHY CHEST WITH CONTRAST TECHNIQUE: Multidetector CT imaging of the chest was performed using the standard protocol during bolus administration of intravenous contrast. Multiplanar CT image reconstructions and MIPs were obtained to evaluate the vascular anatomy. CONTRAST:  72mL OMNIPAQUE IOHEXOL 350 MG/ML SOLN COMPARISON:  None. FINDINGS: Cardiovascular: Atherosclerotic changes are seen in the thoracic and upper abdominal aorta. The ascending thoracic aorta measures up to 3.7 cm on series 7, image 199. The proximal aortic arch measures 3.4 cm in dimension on series  5, image 50. The heart size is normal. The main pulmonary artery is normal in caliber. No pulmonary emboli identified. Mediastinum/Nodes: No enlarged mediastinal, hilar, or axillary lymph nodes. Thyroid gland, trachea, and esophagus demonstrate no significant findings. Lungs/Pleura: There is a 2.5 cm bulla in the medial left apex. There is a nodule in the left apex best seen on axial image 26 of series 6 and sagittal image 110. This nodule measures 8 by 6 x 6 mm with a mean diameter of 7 mm. There is a small nodule in the left base on series 6, image 120 measuring up to 3 mm. There is a nodule in the anterior right lung on series 6, image 102 measuring up to 4 5 mm. A tiny nodule seen in the posterior right lower lobe on series 6, image 111. No suspicious infiltrates. There is a calcified nodule in the medial right upper lobe on series 6,  image 41 of no significance. Upper Abdomen: No acute abnormality. Musculoskeletal: No chest wall abnormality. No acute or significant osseous findings. Review of the MIP images confirms the above findings. IMPRESSION: 1. No pulmonary emboli. 2. Atherosclerosis in the thoracic and upper abdominal aorta. The ascending thoracic aorta is nonaneurysmal. The proximal aortic arch measures 3.4 cm which is mildly aneurysmal. Recommend annual imaging followup by CTA or MRA. This recommendation follows 2010 ACCF/AHA/AATS/ACR/ASA/SCA/SCAI/SIR/STS/SVM Guidelines for the Diagnosis and Management of Patients with Thoracic Aortic Disease. Circulation.2010; 121: C623-J628. Aortic aneurysm NOS (ICD10-I71.9) 3. Pulmonary nodules as described above. The largest is seen in the left apex with a mean diameter 7 mm. Non-contrast chest CT at 6-12 months is recommended. If the nodule is stable at time of repeat CT, then future CT at 18-24 months (from today's scan) is considered optional for low-risk patients, but is recommended for high-risk patients. This recommendation follows the consensus statement:  Guidelines for Management of Incidental Pulmonary Nodules Detected on CT Images: From the Fleischner Society 2017; Radiology 2017; 284:228-243. 4. No other abnormalities. Aortic Atherosclerosis (ICD10-I70.0). Aortic aneurysm NOS (ICD10-I71.9). Electronically Signed   By: Dorise Bullion III M.D   On: 09/12/2019 12:05   ECHOCARDIOGRAM COMPLETE  Result Date: 09/09/2019    ECHOCARDIOGRAM REPORT   Patient Name:   Dean Obrien Date of Exam: 09/09/2019 Medical Rec #:  [id number]       Height:       63.0 in Accession #:    [id number]      Weight:       130.1 lb Date of Birth:  [birth year]        BSA:          1.611 m Patient Age:    54 years        BP:           120/72 mmHg Patient Gender: M               HR:           78 bpm. Exam Location:  [facility] Procedure: 2D Echo, Cardiac Doppler and Color Doppler Indications:     Syncope 780.2 severe aortic valve stenosis  History:         Patient has no prior history of Echocardiogram examinations.                  Risk Factors:Hypertension.  Sonographer:     Sherrie Sport RDCS (AE) Referring Phys:  [place] Diagnosing Phys: Ida Rogue MD IMPRESSIONS  1. Left ventricular ejection fraction, by estimation, is 55 to 60%. The left ventricle has normal function. The left ventricle has no regional wall motion abnormalities. There is moderate left ventricular hypertrophy. Left ventricular diastolic parameters are consistent with Grade I diastolic dysfunction (impaired relaxation).  2. Right ventricular systolic function is normal. The right ventricular size is normal. There is normal pulmonary artery systolic pressure. The estimated right ventricular systolic pressure is 85.4 mmHg.  3. Mild mitral valve regurgitation.  4. The aortic valve was not well visualized. Appears heavily calcified. Aortic valve regurgitation is not visualized. Critical aortic valve stenosis. Aortic valve area, by VTI measures 0.42 cm. Aortic valve mean gradient measures 84.7 mmHg. Aortic valve Vmax  measures 5.51 m/s. FINDINGS  Left Ventricle: Left ventricular ejection fraction, by estimation, is 55 to 60%. The left ventricle has normal function. The left ventricle has no regional wall motion abnormalities. The left ventricular internal cavity size was normal in size. There is  moderate left ventricular hypertrophy. Left ventricular diastolic parameters are consistent with Grade I diastolic dysfunction (impaired relaxation). Right Ventricle: The right ventricular size is normal. No increase in right ventricular wall thickness. Right ventricular systolic function is normal. There is normal pulmonary artery systolic pressure. The tricuspid regurgitant velocity is 2.19 m/s, and  with an assumed right atrial pressure of 10 mmHg, the estimated right ventricular systolic pressure is 40.1 mmHg. Left Atrium: Left atrial size was normal in size. Right Atrium: Right atrial size was normal in size. Pericardium: There is no evidence of pericardial effusion. Mitral Valve: The mitral valve is normal in structure. Normal mobility of the mitral valve leaflets. Mild mitral valve regurgitation. No evidence of mitral valve stenosis. Tricuspid Valve: The tricuspid valve is normal in structure. Tricuspid valve regurgitation is trivial. No evidence of tricuspid stenosis. Aortic Valve: The aortic valve was not well visualized. Aortic valve regurgitation is not visualized. Severe aortic stenosis is present. Aortic valve mean gradient measures 84.7 mmHg. Aortic valve peak gradient measures 121.6 mmHg. Aortic valve area, by VTI measures 0.42 cm. Pulmonic Valve: The pulmonic valve was normal in structure. Pulmonic valve regurgitation is not visualized. No evidence of pulmonic stenosis. Aorta: The aortic root is normal in size and structure. Venous: The inferior vena cava is normal in size with greater than 50% respiratory variability, suggesting right atrial pressure of 3 mmHg. IAS/Shunts: No atrial level shunt detected by color flow  Doppler.  LEFT VENTRICLE PLAX 2D LVIDd:         4.22 cm  Diastology LVIDs:         2.89 cm  LV e' lateral:   4.79 cm/s LV PW:         1.27 cm  LV E/e' lateral: 15.8 LV IVS:        1.18 cm  LV e' medial:    5.55 cm/s LVOT diam:     2.00 cm  LV E/e' medial:  13.7 LV SV:         56 LV SV Index:   35 LVOT Area:     3.14 cm  RIGHT VENTRICLE RV Basal diam:  2.92 cm RV S prime:     16.50 cm/s TAPSE (M-mode): 4.4 cm LEFT ATRIUM             Index       RIGHT ATRIUM           Index LA diam:        3.00 cm 1.86 cm/m  RA Area:     14.20 cm LA Vol (A2C):   51.9 ml 32.23 ml/m RA Volume:   33.90 ml  21.05 ml/m LA Vol (A4C):   62.1 ml 38.56 ml/m LA Biplane Vol: 56.5 ml 35.08 ml/m  AORTIC VALVE                    PULMONIC VALVE AV Area (Vmax):    0.41 cm     PV Vmax:        0.76 m/s AV Area (Vmean):   0.39 cm     PV Peak grad:   2.3 mmHg AV Area (VTI):     0.42 cm     RVOT Peak grad: 5 mmHg AV Vmax:           551.33 cm/s AV Vmean:          441.333 cm/s AV VTI:            1.330 m AV Peak  Grad:      121.6 mmHg AV Mean Grad:      84.7 mmHg LVOT Vmax:         71.80 cm/s LVOT Vmean:        54.200 cm/s LVOT VTI:          0.179 m LVOT/AV VTI ratio: 0.13  AORTA Ao Root diam: 2.80 cm MITRAL VALVE                TRICUSPID VALVE MV Area (PHT): 3.13 cm     TR Peak grad:   19.2 mmHg MV Decel Time: 242 msec     TR Vmax:        219.00 cm/s MV E velocity: 75.80 cm/s MV A velocity: 102.00 cm/s  SHUNTS MV E/A ratio:  0.74         Systemic VTI:  0.18 m                             Systemic Diam: 2.00 cm Ida Rogue MD Electronically signed by Ida Rogue MD Signature Date/Time: 09/09/2019/1:24:57 PM    Final    ECHO INTRAOPERATIVE TEE  Result Date: 09/14/2019  *INTRAOPERATIVE TRANSESOPHAGEAL REPORT *  Patient Name:   Dean Obrien Date of Exam: 09/14/2019 Medical Rec #:  [id number]       Height:       63.0 in Accession #:    [id number]      Weight:       138.2 lb Date of Birth:  13-Mar-1960        BSA:          1.65 m Patient Age:     43 years        BP:           82/44 mmHg Patient Gender: M               HR:           60 bpm. Exam Location:  Anesthesiology Transesophogeal exam was perform intraoperatively during surgical procedure. Patient was closely monitored under general anesthesia during the entirety of examination. Indications:     CAD Native Vessel, Aortic Stenosis Sonographer:     Raquel Sarna Senior RDCS Performing Phys: [id number] Ahmed Prima Z ATKINS Diagnosing Phys: Annye Asa MD Complications: No known complications during this procedure. POST-OP IMPRESSIONS Limited post CPB exam: - Left Ventricle: The left ventricle appears unchanged from pre-bypass. There are no wall motion abnormalities. The overall EF remains normal, 55-60%. - Aortic Valve: A pericardial bioprosthetic valve was placed, leaflets are freely mobile and function well. No regurgitation post repair. There is no aortic stenosis, regurgitation, or perivalvular leak. - Mitral Valve: The mitral valve appears unchanged from pre-bypass images. PRE-OP FINDINGS  Left Ventricle: The left ventricle has hyperdynamic systolic function, with an ejection fraction of >65%, calculated 71%. The cavity size was normal. There is no increase in left ventricular wall thickness. No evidence of left ventricular regional wall motion abnormalities. Right Ventricle: The right ventricle has normal systolic function. The cavity size was normal. There is no increase in right ventricular wall thickness. Right ventricular systolic pressure is normal. Left Atrium: Left atrial size was normal in size. The left atrial appendage is well visualized and there is no evidence of thrombus present. Left atrial appendage velocity is normal at greater than 40 cm/s. Right Atrium: Right atrial size was normal in size. Interatrial Septum: No atrial level shunt detected by color  flow Doppler. Pericardium: There is no evidence of pericardial effusion. Mitral Valve: The mitral valve is normal in structure. No thickening of  the mitral valve leaflets. No calcification of the mitral valve leaflets. Mitral valve regurgitation is trivial by color flow Doppler. There is no evidence of mitral valve vegetation. Pulmonary venous flow is normal. There is No evidence of mitral stenosis, with mean gradient 1 mmHg. Tricuspid Valve: The tricuspid valve was normal in structure. Tricuspid valve regurgitation was not visualized by color flow Doppler. There is no evidence of tricuspid valve vegetation. Aortic Valve: The aortic valve is bicuspid/functionally bicuspid. There is severe thickening of the aortic valve leaflets.There is severe calcifcation of the aortic valve leaflets. Aortic valve regurgitation is trivial by color flow Doppler. There is severe stenosis of the aortic valve, with a calculated valve area of 0.40 cm (VTI), mean gradient 67 mmHg, peak gradient 98 mmHg. Pulmonic Valve: The pulmonic valve was normal in structure, with normal leaflet excursion. No evidence of pulmonic stenosis. Pulmonic valve regurgitation is trivial, around the PA catheter, by color flow Doppler. 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 There is mild-mod calcification of the ascending aorta. Pulmonary Artery: Gordy Councilman catheter present on the right. The pulmonary artery is of normal size. Venous: The inferior vena cava was not well visualized. The inferior vena cava is normal in size with less than 50% respiratory variability, suggesting right atrial pressure of 8 mmHg. +--------------+--------++ LEFT VENTRICLE         +--------------+--------++ PLAX 2D                +--------------+--------++ LVIDd:        3.30 cm  +--------------+--------++ LVIDs:        2.00 cm  +--------------+--------++ LV IVS:       3.50 cm  +--------------+--------++ LVOT diam:    2.20 cm  +--------------+--------++ LV SV:        31 ml    +--------------+--------++ LV SV Index:  18.70     +--------------+--------++ LVOT Area:    3.80 cm +--------------+--------++                        +--------------+--------++ +------------------+------------++ AORTIC VALVE                   +------------------+------------++ AV Area (Vmax):   0.52 cm     +------------------+------------++ AV Area (Vmean):  0.46 cm     +------------------+------------++ AV Area (VTI):    0.44 cm     +------------------+------------++ AV Vmax:          473.50 cm/s  +------------------+------------++ AV Vmean:         374.000 cm/s +------------------+------------++ AV VTI:           1.240 m      +------------------+------------++ AV Peak Grad:     89.7 mmHg    +------------------+------------++ AV Mean Grad:     61.5 mmHg    +------------------+------------++ LVOT Vmax:        64.20 cm/s   +------------------+------------++ LVOT Vmean:       45.600 cm/s  +------------------+------------++ LVOT VTI:         0.145 m      +------------------+------------++ LVOT/AV VTI ratio:0.12         +------------------+------------++ +-------------+---------++ MITRAL VALVE           +--------------+-------+ +-------------+---------++ SHUNTS  MV Peak grad:3.0 mmHg  +--------------+-------+ +-------------+---------++ Systemic VTI: 0.14 m  MV Mean grad:1.0 mmHg  +--------------+-------+ +-------------+---------++ Systemic Diam:2.20 cm MV Vmax:     0.86 m/s  +--------------+-------+ +-------------+---------++ MV Vmean:    51.2 cm/s +-------------+---------++ MV VTI:      0.28 m    +-------------+---------++  Annye Asa MD Electronically signed by Annye Asa MD Signature Date/Time: 09/14/2019/5:38:45 PM    Final    VAS US DOPPLER PRE CABG  Result Date: 09/13/2019 PREOPERATIVE VASCULAR EVALUATION  Indications:  Pre-CABG and Aortic valve repair. Risk Factors: Hypertension, hyperlipidemia, current smoker, coronary artery                disease. Performing Technologist: Oda Cogan RDMS, RVT  Examination Guidelines: A complete evaluation includes B-mode imaging, spectral Doppler, color Doppler, and power Doppler as needed of all accessible portions of each vessel. Bilateral testing is considered an integral part of a complete examination. Limited examinations for reoccurring indications may be performed as noted.  Right Carotid Findings: +----------+--------+--------+--------+--------+------------------+           PSV cm/sEDV cm/sStenosisDescribeComments           +----------+--------+--------+--------+--------+------------------+ CCA Prox  83      20                                         +----------+--------+--------+--------+--------+------------------+ CCA Distal63      23                      intimal thickening +----------+--------+--------+--------+--------+------------------+ ICA Prox  86      22      1-39%           intimal thickening +----------+--------+--------+--------+--------+------------------+ ICA Distal73      28                                         +----------+--------+--------+--------+--------+------------------+ ECA       85      19                                         +----------+--------+--------+--------+--------+------------------+ Portions of this table do not appear on this page. +----------+--------+-------+----------------+------------+           PSV cm/sEDV cmsDescribe        Arm Pressure +----------+--------+-------+----------------+------------+ Subclavian137            Multiphasic, WNL             +----------+--------+-------+----------------+------------+ +---------+--------+--+--------+--+---------+ VertebralPSV cm/s55EDV cm/s13Antegrade +---------+--------+--+--------+--+---------+ Left Carotid Findings: +----------+--------+--------+--------+------------+------------------+           PSV cm/sEDV cm/sStenosisDescribe    Comments            +----------+--------+--------+--------+------------+------------------+ CCA Prox  73      22                                             +----------+--------+--------+--------+------------+------------------+ CCA Distal63      20  intimal thickening +----------+--------+--------+--------+------------+------------------+ ICA Prox  167     48      40-59%  heterogenous                   +----------+--------+--------+--------+------------+------------------+ ICA Mid   89      28                                             +----------+--------+--------+--------+------------+------------------+ ICA Distal84      38                                             +----------+--------+--------+--------+------------+------------------+ ECA       114     14                                             +----------+--------+--------+--------+------------+------------------+ +----------+--------+--------+----------------+------------+ SubclavianPSV cm/sEDV cm/sDescribe        Arm Pressure +----------+--------+--------+----------------+------------+           123             Multiphasic, WNL             +----------+--------+--------+----------------+------------+ +---------+--------+--+--------+--+---------+ VertebralPSV cm/s28EDV cm/s11Antegrade +---------+--------+--+--------+--+---------+  ABI Findings: +--------+------------------+-----+---------+--------+ Right   Rt Pressure (mmHg)IndexWaveform Comment  +--------+------------------+-----+---------+--------+ [id number]                    triphasic         +--------+------------------+-----+---------+--------+ ATA                            triphasic         +--------+------------------+-----+---------+--------+ PTA                            triphasic         +--------+------------------+-----+---------+--------+ +--------+------------------+-----+---------+-------+  Left    Lt Pressure (mmHg)IndexWaveform Comment +--------+------------------+-----+---------+-------+ Brachial                       triphasic        +--------+------------------+-----+---------+-------+ ATA                            triphasic        +--------+------------------+-----+---------+-------+ PTA                            triphasic        +--------+------------------+-----+---------+-------+  Right Doppler Findings: +--------+--------+-----+---------+--------+ Site    PressureIndexDoppler  Comments +--------+--------+-----+---------+--------+ [id number]          triphasic         +--------+--------+-----+---------+--------+ Radial               triphasic         +--------+--------+-----+---------+--------+ Ulnar                biphasic          +--------+--------+-----+---------+--------+  Left Doppler Findings: +--------+--------+-----+---------+--------+ Site    PressureIndexDoppler  Comments +--------+--------+-----+---------+--------+ Brachial  triphasic         +--------+--------+-----+---------+--------+ Radial               triphasic         +--------+--------+-----+---------+--------+ Ulnar                triphasic         +--------+--------+-----+---------+--------+  Summary: Right Carotid: Velocities in the right ICA are consistent with a 1-39% stenosis. Left Carotid: Velocities in the left ICA are consistent with a 40-59% stenosis. Vertebrals: Bilateral vertebral arteries demonstrate antegrade flow. Right ABI: Normal triphasic waveformsat the level of the ankle. Left ABI: Normal triphasic waveforms at the level of the ankle. Right Upper Extremity: Doppler waveforms remain within normal limits with right radial compression. Doppler waveforms decrease >50% with right ulnar compression. Left Upper Extremity: Doppler waveforms remain within normal limits with left radial compression. Doppler waveforms decrease  >50% with left ulnar compression.  Electronically signed by Ruta Hinds MD on 09/13/2019 at 5:14:25 PM.    Final     Discharge Instructions    Amb Referral to Cardiac Rehabilitation   Complete by: As directed    Diagnosis:  CABG Valve Replacement     Valve: Aortic   CABG X ___: 2   After initial evaluation and assessments completed: Virtual Based Care may be provided alone or in conjunction with Phase 2 Cardiac Rehab based on patient barriers.: Yes      Discharge Medications: Allergies as of 09/19/2019      Reactions   Benadryl [diphenhydramine]    Racing heart, syncope      Medication List    STOP taking these medications   nitroGLYCERIN 0.4 MG SL tablet Commonly known as: NITROSTAT     TAKE these medications   amiodarone 200 MG tablet Commonly known as: PACERONE Take 1 tablet (200 mg total) by mouth 2 (two) times daily. For 5 days then take 200 mg daily thereafter   aspirin 81 MG EC tablet Take 1 tablet (81 mg total) by mouth daily. Swallow whole.   atorvastatin 40 MG tablet Commonly known as: LIPITOR Take 1 tablet (40 mg total) by mouth daily.   clopidogrel 75 MG tablet Commonly known as: PLAVIX Take 1 tablet (75 mg total) by mouth daily.   colchicine 0.6 MG tablet Take 1 tablet (0.6 mg total) by mouth 2 (two) times daily. Until finished   furosemide 40 MG tablet Commonly known as: LASIX Take 1 tablet (40 mg total) by mouth daily. For 4 days then stop.   metoprolol tartrate 25 MG tablet Commonly known as: LOPRESSOR Take 1 tablet (25 mg total) by mouth 2 (two) times daily. What changed: how much to take   nicotine 21 mg/24hr patch Commonly known as: NICODERM CQ - dosed in mg/24 hours Place 1 patch (21 mg total) onto the skin daily.   potassium chloride SA 20 MEQ tablet Commonly known as: KLOR-CON Take 1 tablet (20 mEq total) by mouth daily.   traMADol 50 MG tablet Commonly known as: ULTRAM Take 1 tablet (50 mg total) by mouth every 6 (six) hours  as needed for moderate pain or severe pain.      The patient has been discharged on:   1.Beta Blocker:  Yes [ x  ]                              No   [   ]  If No, reason:  2.Ace Inhibitor/ARB: Yes [   ]                                     No  [ x   ]                                     If No, reason:Labile BP  3.Statin:   Yes [ x  ]                  No  [   ]                  If No, reason:  4.Ecasa:  Yes  [ x  ]                  No   [   ]                  If No, reason:  Follow Up Appointments:  Follow-up Information    Wonda Olds, MD. Go on 10/04/2019.   Specialty: Cardiothoracic Surgery Why: Appointment time is at 2:00 pm Contact information: [address] [place] [address] [place] Follow up.   Why: Obtain a medical doctor for further surveillance of HGA1C 5.8 (pre diabetes)       Loel Dubonnet, NP. Go on 10/01/2019.   Specialty: Cardiology Why: Appointment time is at 9:00 am Contact information: [place] [address] [phone]               Signed: Sharalyn Ink [facility] 09/19/2019, 10:33 AM

## 2019-09-17 NOTE — Progress Notes (Signed)
3 Days Post-Op Procedure(s) (LRB): CORONARY ARTERY BYPASS GRAFTING (CABG) x Two using bilateral Internal mammary arteries (N/A) AORTIC VALVE REPLACEMENT (AVR) USING INSPIRIS VALVE SIZE 25MM (N/A) TRANSESOPHAGEAL ECHOCARDIOGRAM (TEE) (N/A) INDOCYANINE GREEN FLUORESCENCE IMAGING (ICG) (N/A) Subjective: Feeling better except one episode of Rapid AF overnight  Objective: Vital signs in last 24 hours: Temp:  [97.6 F (36.4 C)-97.9 F (36.6 C)] 97.8 F (36.6 C) (08/27 0645) Pulse Rate:  [58-156] 71 (08/27 0700) Cardiac Rhythm: Normal sinus rhythm (08/27 0400) Resp:  [13-25] 15 (08/27 0700) BP: (78-154)/(52-94) 130/74 (08/27 0600) SpO2:  [90 %-99 %] 99 % (08/27 0700) Weight:  [67.8 kg-68.1 kg] 67.8 kg (08/27 0642)  Hemodynamic parameters for last 24 hours:    Intake/Output from previous day: 08/26 0701 - 08/27 0700 In: 592.8 [P.O.:150; I.V.:442.8] Out: 2505 [Urine:2035; Chest Tube:470] Intake/Output this shift: No intake/output data recorded.  General appearance: alert and cooperative Neurologic: intact Heart: regular rate and rhythm, S1, S2 normal, no murmur, click, rub or gallop Lungs: clear to auscultation bilaterally Abdomen: soft, non-tender; bowel sounds normal; no masses,  no organomegaly Extremities: edema mild Wound: dressed, dry  Lab Results: Recent Labs    09/16/19 0050 09/17/19 0438  WBC 18.2* 18.1*  HGB 9.5* 9.4*  HCT 29.2* 28.8*  PLT 117* 122*   BMET:  Recent Labs    09/16/19 0050 09/17/19 0438  NA 139 136  K 4.7 3.5  CL 107 100  CO2 24 27  GLUCOSE 120* 113*  BUN 9 12  CREATININE 0.66 0.63  CALCIUM 8.7* 8.3*    PT/INR:  Recent Labs    09/14/19 1342  LABPROT 19.3*  INR 1.7*   ABG    Component Value Date/Time   PHART 7.312 (L) 09/14/2019 1936   HCO3 24.1 09/14/2019 1936   TCO2 26 09/14/2019 1936   ACIDBASEDEF 2.0 09/14/2019 1936   O2SAT 99.0 09/14/2019 1936   CBG (last 3)  Recent Labs    09/17/19 0011 09/17/19 0437 09/17/19 0644   GLUCAP 120* 99 105*    Assessment/Plan: S/P Procedure(s) (LRB): CORONARY ARTERY BYPASS GRAFTING (CABG) x Two using bilateral Internal mammary arteries (N/A) AORTIC VALVE REPLACEMENT (AVR) USING INSPIRIS VALVE SIZE 25MM (N/A) TRANSESOPHAGEAL ECHOCARDIOGRAM (TEE) (N/A) INDOCYANINE GREEN FLUORESCENCE IMAGING (ICG) (N/A) Mobilize Diuresis Plan for transfer to step-down: see transfer orders   LOS: 7 days    Wonda Olds 09/17/2019

## 2019-09-17 NOTE — Progress Notes (Signed)
CARDIAC REHAB PHASE I   Went to offer to walk with pt, pt recently received lunch tray, will f/u later to encourage ambulation.  Rufina Falco, RN BSN 09/17/2019 11:36 AM

## 2019-09-17 NOTE — Progress Notes (Signed)
CARDIAC REHAB PHASE I   PRE:  Rate/Rhythm: 71 SR  BP:  Sitting: 122/75      SaO2: 95 RA  MODE:  Ambulation: 810 ft   POST:  Rate/Rhythm: 91 SR  BP:  Sitting: 141/74    SaO2: 97 RA  Pt ambulated 819ft in hallway standby assist with EVA. Pt denies pain, SOB, or dizziness. Pt returned to bed, able to maintain sternal precautions. Encouraged continued ambulation and IS use. Will continue to follow.  [id number] Rufina Falco, RN BSN 09/17/2019 1:51 PM

## 2019-09-18 ENCOUNTER — Inpatient Hospital Stay (HOSPITAL_COMMUNITY)

## 2019-09-18 LAB — GLUCOSE, CAPILLARY
Glucose-Capillary: 102 mg/dL — ABNORMAL HIGH (ref 70–99)
Glucose-Capillary: 130 mg/dL — ABNORMAL HIGH (ref 70–99)

## 2019-09-18 LAB — BASIC METABOLIC PANEL
Anion gap: 8 (ref 5–15)
Anion gap: 9 (ref 5–15)
BUN: 10 mg/dL (ref 6–20)
BUN: 11 mg/dL (ref 6–20)
CO2: 30 mmol/L (ref 22–32)
CO2: 28 mmol/L (ref 22–32)
Calcium: 7.9 mg/dL — ABNORMAL LOW (ref 8.9–10.3)
Calcium: 8.3 mg/dL — ABNORMAL LOW (ref 8.9–10.3)
Chloride: 94 mmol/L — ABNORMAL LOW (ref 98–111)
Chloride: 96 mmol/L — ABNORMAL LOW (ref 98–111)
Creatinine, Ser: 0.62 mg/dL (ref 0.61–1.24)
Creatinine, Ser: 0.67 mg/dL (ref 0.61–1.24)
GFR calc Af Amer: >60 mL/min (ref >60)
GFR calc Af Amer: >60 mL/min (ref >60)
GFR calc non Af Amer: >60 mL/min (ref >60)
GFR calc non Af Amer: >60 mL/min (ref >60)
Glucose, Bld: 109 mg/dL — ABNORMAL HIGH (ref 70–99)
Glucose, Bld: 113 mg/dL — ABNORMAL HIGH (ref 70–99)
Potassium: 2.7 mmol/L — CL (ref 3.5–5.1)
Potassium: 3.3 mmol/L — ABNORMAL LOW (ref 3.5–5.1)
Sodium: 132 mmol/L — ABNORMAL LOW (ref 135–145)
Sodium: 133 mmol/L — ABNORMAL LOW (ref 135–145)

## 2019-09-18 LAB — CBC
HCT: 27.4 % — ABNORMAL LOW (ref 39.0–52.0)
Hemoglobin: 9.3 g/dL — ABNORMAL LOW (ref 13.0–17.0)
MCH: 31 pg (ref 26.0–34.0)
MCHC: 33.9 g/dL (ref 30.0–36.0)
MCV: 91.3 fL (ref 80.0–100.0)
Platelets: 155 10*3/uL (ref 150–400)
RBC: 3 MIL/uL — ABNORMAL LOW (ref 4.22–5.81)
RDW: 13.1 % (ref 11.5–15.5)
WBC: 15.4 10*3/uL — ABNORMAL HIGH (ref 4.0–10.5)
nRBC: 0 % (ref 0.0–0.2)

## 2019-09-18 MED ORDER — POTASSIUM CHLORIDE CRYS ER 20 MEQ PO TBCR
20.0000 meq | EXTENDED_RELEASE_TABLET | ORAL | Status: AC
  Administered 2019-09-18 (×3): 20 meq via ORAL
  Filled 2019-09-18 (×4): qty 1

## 2019-09-18 MED ORDER — FUROSEMIDE 10 MG/ML IJ SOLN: 40.0000 mg | Freq: Every day | INTRAMUSCULAR | Status: DC

## 2019-09-18 MED ORDER — AMIODARONE HCL 200 MG PO TABS
200.0000 mg | ORAL_TABLET | Freq: Two times a day (BID) | ORAL | Status: DC
  Administered 2019-09-18 – 2019-09-19 (×3): 200 mg via ORAL
  Filled 2019-09-18 (×3): qty 1

## 2019-09-18 MED ORDER — POTASSIUM CHLORIDE CRYS ER 20 MEQ PO TBCR
30.0000 meq | EXTENDED_RELEASE_TABLET | Freq: Two times a day (BID) | ORAL | Status: AC
  Administered 2019-09-18 (×2): 30 meq via ORAL
  Filled 2019-09-18 (×2): qty 1

## 2019-09-18 NOTE — Progress Notes (Signed)
CARDIAC REHAB PHASE I   PRE:  Rate/Rhythm: 38 SR  BP:  Sitting: 123/75      SaO2: 97 RA  MODE:  Ambulation: 400 ft   POST:  Rate/Rhythm: 92 SR  BP:  Sitting: 121/69    SaO2: 95 RA  Pt ambulated 439ft in hallway standby assist with front wheel walker. Pt denies pain or SOB. Does c/o some leg weakness. D/c education completed with pt. Pt educated on importance of site care and monitoring incisions daily. Encouraged continued IS use, walks, and sternal precautions. Pt given in-the-tube sheet along with heart healthy diet. Pt agreeable to smoking cessation, tip sheet given. Reviewed restrictions and exercise guidelines. Will refer to CRP II [place]. Pt requesting walker for home use, RN made aware. Pt hopeful for d/c tomorrow.  [place], RN BSN 09/18/2019 12:39 PM

## 2019-09-18 NOTE — Plan of Care (Signed)
  Problem: Education: Goal: Knowledge of General Education information will improve Description: Including pain rating scale, medication(s)/side effects and non-pharmacologic comfort measures Outcome: Progressing   Problem: Health Behavior/Discharge Planning: Goal: Ability to manage health-related needs will improve Outcome: Progressing   Problem: Clinical Measurements: Goal: Ability to maintain clinical measurements within normal limits will improve Outcome: Progressing Goal: Will remain free from infection Outcome: Progressing Goal: Respiratory complications will improve Outcome: Progressing   Problem: Activity: Goal: Risk for activity intolerance will decrease Outcome: Progressing   Problem: Nutrition: Goal: Adequate nutrition will be maintained Outcome: Progressing   

## 2019-09-18 NOTE — Progress Notes (Addendum)
      [address]       [place] [address]             [phone]        4 Days Post-Op Procedure(s) (LRB): CORONARY ARTERY BYPASS GRAFTING (CABG) x Two using bilateral Internal mammary arteries (N/A) AORTIC VALVE REPLACEMENT (AVR) USING INSPIRIS VALVE SIZE 25MM (N/A) TRANSESOPHAGEAL ECHOCARDIOGRAM (TEE) (N/A) INDOCYANINE GREEN FLUORESCENCE IMAGING (ICG) (N/A)  Subjective: Patient without specific complaint this am.  Objective: Vital signs in last 24 hours: Temp:  [97.8 F (36.6 C)-99 F (37.2 C)] 98.7 F (37.1 C) (08/28 0725) Pulse Rate:  [61-82] 61 (08/27 1704) Cardiac Rhythm: Normal sinus rhythm (08/28 0710) Resp:  [14-25] 24 (08/28 0725) BP: (109-144)/(59-83) 128/69 (08/28 0725) SpO2:  [93 %-99 %] 95 % (08/28 0331) Weight:  [65.5 kg] 65.5 kg (08/28 0638)  Pre op weight  68 kg Current Weight  09/18/19 65.5 kg      Intake/Output from previous day: 08/27 0701 - 08/28 0700 In: 963.3 [P.O.:480; I.V.:333.4; IV Piggyback:150] Out: 2045 [Urine:1875; Emesis/NG output:150; Chest Tube:20]   Physical Exam:  Cardiovascular: RRR Pulmonary: Clear to auscultation bilaterally Abdomen: Soft, non tender, bowel sounds present. Extremities: Mild bilateral lower extremity edema. Wounds: Clean and dry.  No erythema or signs of infection.  Lab Results: CBC: Recent Labs    09/17/19 0438 09/18/19 0028  WBC 18.1* 15.4*  HGB 9.4* 9.3*  HCT 28.8* 27.4*  PLT 122* 155   BMET:  Recent Labs    09/17/19 0438 09/18/19 0028  NA 136 132*  K 3.5 2.7*  CL 100 94*  CO2 27 30  GLUCOSE 113* 109*  BUN 12 10  CREATININE 0.63 0.62  CALCIUM 8.3* 7.9*    PT/INR:  Lab Results  Component Value Date   INR 1.7 (H) 09/14/2019   INR 1.1 09/13/2019   INR 1.1 09/11/2019   ABG:  INR: Will add last result for INR, ABG once components are confirmed Will add last 4 CBG results once components are confirmed  Assessment/Plan:  1. CV - Previous a fib with RVR. SR  this am. On Plavix 75 mg daily, ec asa 81 mg daily, Amiodarone drip, Lopressor 12.5 tid. Will transition to oral Amiodarone. 2.  Pulmonary - On room air. CXR this am appears stable. Will order PA/LAT CXR for am.Encourage incentive spirometer. 3. Volume Overload - On Lasix 40 mg IV bid, but will decrease to once today 4.  Expected post op acute blood loss anemia - H and H 9.3 and 27.4 5. CBGs 105/102/130. Pre op HGA1C 5.8. Will provide nutrition information and he will need to follow up with medical doctor after discharge. Will stop accu checks and SS PRN 6. Supplement potassium. Already ordered  7. Remove EPW 8. Possible home in am if remains in [place] 09/18/2019,9:21 AM  k low at 2 am, given additional po kcl Repeat today ordered- now 3.3 and additional given  I have seen and examined Dean Obrien and agree with the above assessment  and plan.  Grace Isaac MD Beeper [phone] Office [phone] 09/18/2019 1:14 PM

## 2019-09-18 NOTE — Progress Notes (Signed)
Notified MD of K+ 2.7 order received to replace K+. I will continue to monitor.

## 2019-09-19 ENCOUNTER — Inpatient Hospital Stay (HOSPITAL_COMMUNITY)

## 2019-09-19 LAB — CBC
HCT: 28.9 % — ABNORMAL LOW (ref 39.0–52.0)
Hemoglobin: 9.8 g/dL — ABNORMAL LOW (ref 13.0–17.0)
MCH: 30.9 pg (ref 26.0–34.0)
MCHC: 33.9 g/dL (ref 30.0–36.0)
MCV: 91.2 fL (ref 80.0–100.0)
Platelets: 194 10*3/uL (ref 150–400)
RBC: 3.17 MIL/uL — ABNORMAL LOW (ref 4.22–5.81)
RDW: 13 % (ref 11.5–15.5)
WBC: 13 10*3/uL — ABNORMAL HIGH (ref 4.0–10.5)
nRBC: 0 % (ref 0.0–0.2)

## 2019-09-19 LAB — BASIC METABOLIC PANEL
Anion gap: 10 (ref 5–15)
BUN: 13 mg/dL (ref 6–20)
CO2: 26 mmol/L (ref 22–32)
Calcium: 8.3 mg/dL — ABNORMAL LOW (ref 8.9–10.3)
Chloride: 99 mmol/L (ref 98–111)
Creatinine, Ser: 0.61 mg/dL (ref 0.61–1.24)
GFR calc Af Amer: >60 mL/min (ref >60)
GFR calc non Af Amer: >60 mL/min (ref >60)
Glucose, Bld: 100 mg/dL — ABNORMAL HIGH (ref 70–99)
Potassium: 3.2 mmol/L — ABNORMAL LOW (ref 3.5–5.1)
Sodium: 135 mmol/L (ref 135–145)

## 2019-09-19 MED ORDER — METOPROLOL TARTRATE 25 MG PO TABS: 25.0000 mg | ORAL_TABLET | Freq: Two times a day (BID) | ORAL | 0 refills | Status: DC

## 2019-09-19 MED ORDER — COLCHICINE 0.6 MG PO TABS: 0.6000 mg | ORAL_TABLET | Freq: Two times a day (BID) | ORAL | 0 refills | Status: DC

## 2019-09-19 MED ORDER — CLOPIDOGREL BISULFATE 75 MG PO TABS
75.0000 mg | ORAL_TABLET | Freq: Every day | ORAL | 1 refills | Status: DC
Start: 2019-09-19 — End: 2019-10-01

## 2019-09-19 MED ORDER — METOPROLOL TARTRATE 25 MG PO TABS: 25.0000 mg | ORAL_TABLET | Freq: Three times a day (TID) | ORAL | Status: DC

## 2019-09-19 MED ORDER — POTASSIUM CHLORIDE CRYS ER 20 MEQ PO TBCR
20.0000 meq | EXTENDED_RELEASE_TABLET | Freq: Every day | ORAL | 0 refills | Status: DC
Start: 2019-09-19 — End: 2019-10-01

## 2019-09-19 MED ORDER — POTASSIUM CHLORIDE CRYS ER 20 MEQ PO TBCR
40.0000 meq | EXTENDED_RELEASE_TABLET | Freq: Three times a day (TID) | ORAL | Status: DC
  Administered 2019-09-19: 40 meq via ORAL
  Filled 2019-09-19: qty 2

## 2019-09-19 MED ORDER — METOPROLOL TARTRATE 25 MG/10 ML ORAL SUSPENSION: 25.0000 mg | Freq: Three times a day (TID) | ORAL | Status: DC

## 2019-09-19 MED ORDER — TRAMADOL HCL 50 MG PO TABS
50.0000 mg | ORAL_TABLET | Freq: Four times a day (QID) | ORAL | 0 refills | Status: DC | PRN
Start: 2019-09-19 — End: 2020-04-10

## 2019-09-19 MED ORDER — FUROSEMIDE 40 MG PO TABS
40.0000 mg | ORAL_TABLET | Freq: Every day | ORAL | Status: DC
  Administered 2019-09-19: 40 mg via ORAL
  Filled 2019-09-19: qty 1

## 2019-09-19 MED ORDER — FUROSEMIDE 40 MG PO TABS
40.0000 mg | ORAL_TABLET | Freq: Every day | ORAL | 0 refills | Status: DC
Start: 2019-09-19 — End: 2019-10-01

## 2019-09-19 MED ORDER — AMIODARONE HCL 200 MG PO TABS: 200.0000 mg | ORAL_TABLET | Freq: Two times a day (BID) | ORAL | 1 refills | Status: DC

## 2019-09-19 NOTE — Progress Notes (Addendum)
      [address]       [place] [address]             [phone]        5 Days Post-Op Procedure(s) (LRB): CORONARY ARTERY BYPASS GRAFTING (CABG) x Two using bilateral Internal mammary arteries (N/A) AORTIC VALVE REPLACEMENT (AVR) USING INSPIRIS VALVE SIZE 25MM (N/A) TRANSESOPHAGEAL ECHOCARDIOGRAM (TEE) (N/A) INDOCYANINE GREEN FLUORESCENCE IMAGING (ICG) (N/A)  Subjective: Patient without specific complaint this am;he wants to go home.  Objective: Vital signs in last 24 hours: Temp:  [98.3 F (36.8 C)-99.1 F (37.3 C)] 98.3 F (36.8 C) (08/29 0731) Pulse Rate:  [75] 75 (08/29 0040) Cardiac Rhythm: Normal sinus rhythm (08/29 0708) Resp:  [12-22] 12 (08/29 0731) BP: (123-145)/(70-96) 131/80 (08/29 0731) SpO2:  [97 %] 97 % (08/29 0040)  Pre op weight  68 kg Current Weight  09/18/19 65.5 kg      Intake/Output from previous day: No intake/output data recorded.   Physical Exam:  Cardiovascular: RRR Pulmonary: Clear to auscultation bilaterally Abdomen: Soft, non tender, bowel sounds present. Extremities: No lower extremity edema. Wounds: Clean and dry.  No erythema or signs of infection. Dried blood top of sternal incsion  Lab Results: CBC: Recent Labs    09/18/19 0028 09/19/19 0131  WBC 15.4* 13.0*  HGB 9.3* 9.8*  HCT 27.4* 28.9*  PLT 155 194   BMET:  Recent Labs    09/18/19 1127 09/19/19 0131  NA 133* 135  K 3.3* 3.2*  CL 96* 99  CO2 28 26  GLUCOSE 113* 100*  BUN 11 13  CREATININE 0.67 0.61  CALCIUM 8.3* 8.3*    PT/INR:  Lab Results  Component Value Date   INR 1.7 (H) 09/14/2019   INR 1.1 09/13/2019   INR 1.1 09/11/2019   ABG:  INR: Will add last result for INR, ABG once components are confirmed Will add last 4 CBG results once components are confirmed  Assessment/Plan:  1. CV - Previous a fib with RVR. Continues to maintain SR. On Plavix 75 mg daily, ec asa 81 mg daily, Amiodarone 200 mg bid and Lopressor 12.5 tid.   2.  Pulmonary - On room air. CXR this am appears stable. Will order PA/LAT CXR for am.Encourage incentive spirometer. 3. Volume Overload - On Lasix 40 mg IV bid, but will decrease to once today 4.  Expected post op acute blood loss anemia - H and H this am stable at 9.8 and 28.9 5. Hypokalemia-supplement accordingly 6. As discussed with Dr. Servando Snare, will discharge   Donielle M ZimmermanPA-C 09/19/2019,8:58 AM  Patient feels well No furhter afib on po cordrone Home today  I have seen and examined Dean Obrien and agree with the above assessment  and plan.  Grace Isaac MD Beeper [phone] Office [phone] 09/19/2019 12:11 PM

## 2019-09-19 NOTE — Progress Notes (Signed)
Discharge instructions given to patient. Questions answered. Educated on new medication regimen, signs/symptoms, restrictions, and follow up appointments. Prescriptions sent to pharmacy as [facility] closed today. PIV DC, hemostasis achieved. CT sutures removed with NO complications, steristrips placed per order. Betadine applied to CT sites prior to suture removal. Painted sternal incision with betadine, left OTA. Vital signs stable. All belongings sent home with patient.   Pt escorted by NT via wheelchair to private vehicle driven by daughter.

## 2019-09-19 NOTE — Plan of Care (Signed)
  Problem: Education: Goal: Knowledge of General Education information will improve Description: Including pain rating scale, medication(s)/side effects and non-pharmacologic comfort measures Outcome: Adequate for Discharge   Problem: Health Behavior/Discharge Planning: Goal: Ability to manage health-related needs will improve Outcome: Adequate for Discharge   Problem: Clinical Measurements: Goal: Ability to maintain clinical measurements within normal limits will improve Outcome: Adequate for Discharge Goal: Will remain free from infection Outcome: Adequate for Discharge Goal: Diagnostic test results will improve Outcome: Adequate for Discharge Goal: Respiratory complications will improve Outcome: Adequate for Discharge Goal: Cardiovascular complication will be avoided Outcome: Adequate for Discharge   Problem: Activity: Goal: Risk for activity intolerance will decrease Outcome: Adequate for Discharge   Problem: Nutrition: Goal: Adequate nutrition will be maintained Outcome: Adequate for Discharge   Problem: Coping: Goal: Level of anxiety will decrease Outcome: Adequate for Discharge   Problem: Elimination: Goal: Will not experience complications related to bowel motility Outcome: Adequate for Discharge Goal: Will not experience complications related to urinary retention Outcome: Adequate for Discharge   Problem: Pain Managment: Goal: General experience of comfort will improve Outcome: Adequate for Discharge   Problem: Safety: Goal: Ability to remain free from injury will improve Outcome: Adequate for Discharge   Problem: Skin Integrity: Goal: Risk for impaired skin integrity will decrease Outcome: Adequate for Discharge   Problem: Education: Goal: Will demonstrate proper wound care and an understanding of methods to prevent future damage Outcome: Adequate for Discharge Goal: Knowledge of disease or condition will improve Outcome: Adequate for Discharge Goal:  Knowledge of the prescribed therapeutic regimen will improve Outcome: Adequate for Discharge Goal: Individualized Educational Video(s) Outcome: Adequate for Discharge   Problem: Activity: Goal: Risk for activity intolerance will decrease Outcome: Adequate for Discharge   Problem: Cardiac: Goal: Will achieve and/or maintain hemodynamic stability Outcome: Adequate for Discharge   Problem: Clinical Measurements: Goal: Postoperative complications will be avoided or minimized Outcome: Adequate for Discharge   Problem: Respiratory: Goal: Respiratory status will improve Outcome: Adequate for Discharge   Problem: Skin Integrity: Goal: Wound healing without signs and symptoms of infection Outcome: Adequate for Discharge Goal: Risk for impaired skin integrity will decrease Outcome: Adequate for Discharge   Problem: Urinary Elimination: Goal: Ability to achieve and maintain adequate renal perfusion and functioning will improve Outcome: Adequate for Discharge   

## 2019-09-19 NOTE — Discharge Instructions (Signed)
Discharge Instructions:  1. You may shower, please wash incisions daily with soap and water and keep dry.  If you wish to cover wounds with dressing you may do so but please keep clean and change daily.  No tub baths or swimming until incisions have completely healed.  If your incisions become red or develop any drainage please call our office at [phone]  2. No Driving until cleared by Dr. [name] office and you are no longer using narcotic pain medications  3. Monitor your weight daily.. Please use the same scale and weigh at same time... If you gain 5-10 lbs in 48 hours with associated lower extremity swelling, please contact our office at [phone]  4. Fever of 101.5 for at least 24 hours with no source, please contact our office at [phone]  5. Activity- up as tolerated, please walk at least 3 times per day.  Avoid strenuous activity, no lifting, pushing, or pulling with your arms over 8-10 lbs for a minimum of 6 weeks  6. If any questions or concerns arise, please do not hesitate to contact our office at [phone]   Prediabetes Eating Plan Prediabetes is a condition that causes blood sugar (glucose) levels to be higher than normal. This increases the risk for developing diabetes. In order to prevent diabetes from developing, your health care provider may recommend a diet and other lifestyle changes to help you:  Control your blood glucose levels.  Improve your cholesterol levels.  Manage your blood pressure. Your health care provider may recommend working with a diet and nutrition specialist (dietitian) to make a meal plan that is best for you. What are tips for following this plan? Lifestyle  Set weight loss goals with the help of your health care team. It is recommended that most people with prediabetes lose 7% of their current body weight.  Exercise for at least 30 minutes at least 5 days a week.  Attend a support group or seek ongoing support from a mental health  counselor.  Take over-the-counter and prescription medicines only as told by your health care provider. Reading food labels  Read food labels to check the amount of fat, salt (sodium), and sugar in prepackaged foods. Avoid foods that have: ? Saturated fats. ? Trans fats. ? Added sugars.  Avoid foods that have more than 300 milligrams (mg) of sodium per serving. Limit your daily sodium intake to less than 2,300 mg each day. Shopping  Avoid buying pre-made and processed foods. Cooking  Cook with olive oil. Do not use butter, lard, or ghee.  Bake, broil, grill, or boil foods. Avoid frying. Meal planning   Work with your dietitian to develop an eating plan that is right for you. This may include: ? Tracking how many calories you take in. Use a food diary, notebook, or mobile application to track what you eat at each meal. ? Using the glycemic index (GI) to plan your meals. The index tells you how quickly a food will raise your blood glucose. Choose low-GI foods. These foods take a longer time to raise blood glucose.  Consider following a Mediterranean diet. This diet includes: ? Several servings each day of fresh fruits and vegetables. ? Eating fish at least twice a week. ? Several servings each day of whole grains, beans, nuts, and seeds. ? Using olive oil instead of other fats. ? Moderate alcohol consumption. ? Eating small amounts of red meat and whole-fat dairy.  If you have high blood pressure, you may need to  limit your sodium intake or follow a diet such as the DASH eating plan. DASH is an eating plan that aims to lower high blood pressure. What foods are recommended? The items listed below may not be a complete list. Talk with your dietitian about what dietary choices are best for you. Grains Whole grains, such as whole-wheat or whole-grain breads, crackers, cereals, and pasta. Unsweetened oatmeal. Bulgur. Barley. Quinoa. Brown rice. Corn or whole-wheat flour tortillas or  taco shells. Vegetables Lettuce. Spinach. Peas. Beets. Cauliflower. Cabbage. Broccoli. Carrots. Tomatoes. Squash. Eggplant. Herbs. Peppers. Onions. Cucumbers. Brussels sprouts. Fruits Berries. Bananas. Apples. Oranges. Grapes. Papaya. Mango. Pomegranate. Kiwi. Grapefruit. Cherries. Meats and other protein foods Seafood. Poultry without skin. Lean cuts of pork and beef. Tofu. Eggs. Nuts. Beans. Dairy Low-fat or fat-free dairy products, such as yogurt, cottage cheese, and cheese. Beverages Water. Tea. Coffee. Sugar-free or diet soda. Seltzer water. Lowfat or no-fat milk. Milk alternatives, such as soy or almond milk. Fats and oils Olive oil. Canola oil. Sunflower oil. Grapeseed oil. Avocado. Walnuts. Sweets and desserts Sugar-free or low-fat pudding. Sugar-free or low-fat ice cream and other frozen treats. Seasoning and other foods Herbs. Sodium-free spices. Mustard. Relish. Low-fat, low-sugar ketchup. Low-fat, low-sugar barbecue sauce. Low-fat or fat-free mayonnaise. What foods are not recommended? The items listed below may not be a complete list. Talk with your dietitian about what dietary choices are best for you. Grains Refined white flour and flour products, such as bread, pasta, snack foods, and cereals. Vegetables Canned vegetables. Frozen vegetables with butter or cream sauce. Fruits Fruits canned with syrup. Meats and other protein foods Fatty cuts of meat. Poultry with skin. Breaded or fried meat. Processed meats. Dairy Full-fat yogurt, cheese, or milk. Beverages Sweetened drinks, such as sweet iced tea and soda. Fats and oils Butter. Lard. Ghee. Sweets and desserts Baked goods, such as cake, cupcakes, pastries, cookies, and cheesecake. Seasoning and other foods Spice mixes with added salt. Ketchup. Barbecue sauce. Mayonnaise. Summary  To prevent diabetes from developing, you may need to make diet and other lifestyle changes to help control blood sugar, improve  cholesterol levels, and manage your blood pressure.  Set weight loss goals with the help of your health care team. It is recommended that most people with prediabetes lose 7 percent of their current body weight.  Consider following a Mediterranean diet that includes plenty of fresh fruits and vegetables, whole grains, beans, nuts, seeds, fish, lean meat, low-fat dairy, and healthy oils. This information is not intended to replace advice given to you by your health care provider. Make sure you discuss any questions you have with your health care provider. Document Revised: 05/01/2018 Document Reviewed: 03/13/2016 Elsevier Patient Education  2020 [employer].

## 2019-09-19 NOTE — Care Management (Signed)
1325 09-19-19 Order for durable medical equipment (DME) placed in [place]. Case Manager called referral to [employer]. [employer] will deliver the rolling walker to the room prior to transition home. Prescriptions have been sent to the patient's pharmacy outpatient. Staff RN aware that the DME will be delivered prior to transition home.  No further needs from Case Manager at this time. Bethena Roys, RN,BSN Case Manager

## 2019-09-20 ENCOUNTER — Telehealth: Payer: Self-pay | Admitting: Family

## 2019-09-20 MED FILL — Calcium Chloride Inj 10%: INTRAVENOUS | Qty: 10 | Status: AC

## 2019-09-20 MED FILL — Sodium Chloride IV Soln 0.9%: INTRAVENOUS | Qty: 3000 | Status: AC

## 2019-09-20 MED FILL — Mannitol IV Soln 20%: INTRAVENOUS | Qty: 500 | Status: AC

## 2019-09-20 MED FILL — Electrolyte-R (PH 7.4) Solution: INTRAVENOUS | Qty: 4000 | Status: AC

## 2019-09-20 MED FILL — Sodium Bicarbonate IV Soln 8.4%: INTRAVENOUS | Qty: 50 | Status: AC

## 2019-09-20 MED FILL — Heparin Sodium (Porcine) Inj 1000 Unit/ML: INTRAMUSCULAR | Qty: 10 | Status: AC

## 2019-09-20 MED FILL — Albumin, Human Inj 5%: INTRAVENOUS | Qty: 250 | Status: AC

## 2019-09-20 NOTE — Telephone Encounter (Signed)
Patient states he was discharged from [facility] yesterday and was given a list of prescriptions. States he was adivsed to take Lipitor, but did not give him a prescription. States his notes from the hospital noted he already had a prescription for Lipitor. Patient states he does not take Lipitor . Please call to discuss.

## 2019-09-20 NOTE — Telephone Encounter (Signed)
S/w patient's pharmacy and they have the prescription for the Atorvastatin there along with 4 others for a total of 5 prescriptions ready for patient to pick up. Notified patient and he was appreciative and will pick up the prescriptions at his convenience.     Patient contacted regarding discharge from [facility] on 09/19/19.  Patient understands to follow up with provider Gilford Rile, NP on 10/01/19 at 0900 at [facility]. Patient understands discharge instructions? yes Patient understands medications and regiment? yes Patient understands to bring all medications to this visit? yes  Ask patient:  Are you enrolled in My Chart - no not at this time.   Postop Surgical Patients:                What is your wound status? Any signs/ symptoms of infection (Temp, redness/ red streaks, swelling, purulent drainage, foul odor or smell)?   No s/s per patient.               Please do not place any creams/ lotions/ or antibiotic ointment on any surgical incisions/ wounds without physician approval.              Do you have any questions about your medications?  All medications (except pain medications) are to be filled by your Cardiologist AFTER your first post op       appointment with them.  Are you taking your pain medication?              How is your pain controlled? Pain level?              If you require a refill on pain medications, know that the same medication/ amount may not be prescribed or a refill may not be given.  Please contact your pharmacy for refill requests.               Do you have help at home with ADL's?  If you have home health, have you been contacted or seen by the agency?              Please refer to your Pre/post surgery booklet, there is a lot of useful information in it that may answer any questions you may have.              Please note that it is ok to remove your surgical dressing, shower (soap/ water), and pat the incision dry.  For patients Patient verbalized  understanding of the postop surgical patient section and no issues at this time.

## 2019-09-22 ENCOUNTER — Telehealth: Payer: Self-pay | Admitting: Internal Medicine

## 2019-09-22 NOTE — Telephone Encounter (Signed)
Pt called back and was scheduled for VV on 9/16 @ 10:30

## 2019-09-22 NOTE — Telephone Encounter (Signed)
Dr. Derrel Nip will take patient on as a new patient. Message left to call office to set up virtual. This was ok'd by Dr. Derrel Nip on 09/22/2019.

## 2019-10-01 ENCOUNTER — Ambulatory Visit (INDEPENDENT_AMBULATORY_CARE_PROVIDER_SITE_OTHER): Admitting: Family

## 2019-10-01 ENCOUNTER — Other Ambulatory Visit: Payer: Self-pay

## 2019-10-01 ENCOUNTER — Encounter: Payer: Self-pay | Admitting: Family

## 2019-10-01 ENCOUNTER — Other Ambulatory Visit: Payer: Self-pay | Admitting: Cardiothoracic Surgery

## 2019-10-01 VITALS — BP 118/70 | HR 86 | Ht 63.0 in | Wt 134.0 lb

## 2019-10-01 DIAGNOSIS — I4891 Unspecified atrial fibrillation: Secondary | ICD-10-CM

## 2019-10-01 DIAGNOSIS — Z720 Tobacco use: Secondary | ICD-10-CM

## 2019-10-01 DIAGNOSIS — I10 Essential (primary) hypertension: Secondary | ICD-10-CM | POA: Diagnosis not present

## 2019-10-01 DIAGNOSIS — I350 Nonrheumatic aortic (valve) stenosis: Secondary | ICD-10-CM | POA: Diagnosis not present

## 2019-10-01 DIAGNOSIS — I25118 Atherosclerotic heart disease of native coronary artery with other forms of angina pectoris: Secondary | ICD-10-CM

## 2019-10-01 DIAGNOSIS — Z951 Presence of aortocoronary bypass graft: Secondary | ICD-10-CM

## 2019-10-01 DIAGNOSIS — I9789 Other postprocedural complications and disorders of the circulatory system, not elsewhere classified: Secondary | ICD-10-CM

## 2019-10-01 DIAGNOSIS — E785 Hyperlipidemia, unspecified: Secondary | ICD-10-CM | POA: Diagnosis not present

## 2019-10-01 DIAGNOSIS — Z952 Presence of prosthetic heart valve: Secondary | ICD-10-CM

## 2019-10-01 MED ORDER — CLOPIDOGREL BISULFATE 75 MG PO TABS: 75.0000 mg | ORAL_TABLET | Freq: Every day | ORAL | 1 refills | Status: DC

## 2019-10-01 MED ORDER — NICOTINE 7 MG/24HR TD PT24: 7.0000 mg | MEDICATED_PATCH | Freq: Every day | TRANSDERMAL | 0 refills | Status: DC

## 2019-10-01 MED ORDER — ATORVASTATIN CALCIUM 40 MG PO TABS: 40.0000 mg | ORAL_TABLET | Freq: Every day | ORAL | 1 refills | Status: DC

## 2019-10-01 MED ORDER — NICOTINE 14 MG/24HR TD PT24: 14.0000 mg | MEDICATED_PATCH | Freq: Every day | TRANSDERMAL | 0 refills | Status: DC

## 2019-10-01 MED ORDER — AMIODARONE HCL 200 MG PO TABS: 200.0000 mg | ORAL_TABLET | Freq: Two times a day (BID) | ORAL | 1 refills | Status: DC

## 2019-10-01 MED ORDER — METOPROLOL TARTRATE 25 MG PO TABS: 25.0000 mg | ORAL_TABLET | Freq: Two times a day (BID) | ORAL | 1 refills | Status: DC

## 2019-10-01 NOTE — Progress Notes (Signed)
Office Visit    Patient Name: Dean Obrien Date of Encounter: 10/02/2019  Primary Care Provider:  Crecencio Mc, MD Primary Cardiologist:  Ida Rogue, MD Electrophysiologist:  None   Chief Complaint    Dean Obrien is a 59 y.o. male with a hx of severe aortic stenosis s/p bioprosthetic AVR 08/2019, CAD s/p CABG X to 08/2019, HTN, HLD, prediabetes, tobacco use presents today for follow-up after hospitalization.  Past Medical History    Past Medical History:  Diagnosis Date  . Aortic stenosis, severe 09/09/2018   RHC 09/10/19 Heavily calcified AV. VTI 0.42 cm. AV mean gradient 84.7 mmHg. Vmax measures 5.51 m/s. nl filling pressures, nl pulmonary pressure, nl CO.  Marland Kitchen CAD (coronary artery disease)    LHC 09/10/2019: pRCA 90%s, pLCx 20%s, mLAD 50%s  . Diastolic dysfunction    9/51/8841  . Hyperlipidemia LDL goal <70    8/19 LDL 87  . Hypertension   . Prediabetes    Past Surgical History:  Procedure Laterality Date  . AORTIC VALVE REPLACEMENT N/A 09/14/2019   Procedure: AORTIC VALVE REPLACEMENT (AVR) USING INSPIRIS VALVE SIZE 25MM;  Surgeon: Wonda Olds, MD;  Location: [place];  Service: Open Heart Surgery;  Laterality: N/A;  . CORONARY ARTERY BYPASS GRAFT N/A 09/14/2019   Procedure: CORONARY ARTERY BYPASS GRAFTING (CABG) x Two using bilateral Internal mammary arteries;  Surgeon: Wonda Olds, MD;  Location: [facility];  Service: Open Heart Surgery;  Laterality: N/A;  . RIGHT/LEFT HEART CATH AND CORONARY ANGIOGRAPHY N/A 09/10/2019   Procedure: RIGHT/LEFT HEART CATH AND CORONARY ANGIOGRAPHY;  Surgeon: Wellington Hampshire, MD;  Location: [facility];  Service: Cardiovascular;  Laterality: N/A;  . TEE WITHOUT CARDIOVERSION N/A 09/14/2019   Procedure: TRANSESOPHAGEAL ECHOCARDIOGRAM (TEE);  Surgeon: Wonda Olds, MD;  Location: [facility];  Service: Open Heart Surgery;  Laterality: N/A;    Allergies  Allergies  Allergen Reactions  . Benadryl [Diphenhydramine]      Racing heart, syncope    History of Present Illness    Dean Obrien is a 59 y.o. male with a hx of severe aortic stenosis s/p bioprosthetic AVR 08/2019, CAD s/p CABG X to 08/2019, HTN, HLD, prediabetes, tobacco use last seen while hospitalized.  Previously evaluated by Dr. Rockey Situ 10/2009 after referral for cardiac murmur.  At that clinic visit he denied symptoms of valvular heart disease.  A 2-3/6 SEM murmur was noted and thought to be aortic valve pathology.  Echo recommended but never obtained as he was out of network and declined to be scheduled with another in network group.  09/08/2018 when he was outside his yard working with a chainsaw  he became extremely dizzy with shortness of breath and nauseousness.  He believes he lost consciousness there was one unwitnessed.  EMS was called and he presented to the ED 09/09/2019.  Echo 09/09/2019 with critical aortic stenosis with valve area by VTI 0.42 cm, AV mean gradient 84.7 mmHg, aortic valve V-max 5.15 m/s, LVEF 55 to 60%, no RWMA, gr1DD, mild MR. R/LHC 09/10/2019 significant one-vessel CAD with P RCA 90% stenosed, P LCx 20% stenosis, moderate M LAD 50% stenosis.  He had severely calcified aortic valve restricted opening.  Crossing of aortic valves not attempted.  RHC with normal filling pressures, normal pulmonary pressure, normal cardiac output.  CTA with probable bicuspid aortic valve.  Ascending aorta mildly enlarged at 3.7 cm tapering to 3.4 cm in arch.  He had a 7 mm nodule in LUL that  will require CT follow-up in 6 months due to his tobacco use.  He was transferred to [facility] for evaluation of CABG and AVR.  08/25/2019 he underwent CABGx2 (LIM-LAD, RIMA-RCA) and AVR with 25 mm Edwards Inspiris bioprosthetic.  He went into atrial fibrillation in the postoperative period and was treated with amiodarone drip.  Presents today for follow-up with his niece.  Has quit smoking and has only been using nicotine patches discharge.  He is very motivated to  quit. He does recall his atrial fibrillation while in the hospital. Tells me he felt his heart was beating out of his neck.  Has not noticed recurrent atrial fibrillation since hospital discharge. BP at home 108/58.  Reports no lightheadedness, dizziness, near-syncope, syncope.  His niece is assisting him to check his blood pressure and she is checking it manually as she works in the [employer].  He is very active around his home and hopeful to continue to increase his level activity.  Of note, he stays active as his wife who has MS helping her with many things.   EKGs/Labs/Other Studies Reviewed:   The following studies were reviewed today:  [facility] 09/10/19  Prox Cx lesion is 20% stenosed.  Mid LAD lesion is 50% stenosed.  Prox RCA lesion is 90% stenosed.   1.  Significant one-vessel coronary artery disease with 90% proximal stenosis in the right coronary artery.  Moderate mid LAD stenosis.  Moderate to heavy calcifications are noted. 2.  Severely calcified aortic valve with restricted opening.  I did not attempt to cross the aortic valve. 3.  Right heart catheterization showed normal filling pressures, normal pulmonary pressure and normal cardiac output.   Recommendations: The patient has critical aortic stenosis and one-vessel coronary artery disease with associated syncope.  Given the patient's relatively young age, I suspect that he has bicuspid aortic valve. Recommend transfer to [facility] for evaluation of AVR and one-vessel CABG. Resume heparin 8 hours after sheath pull.  Echo 09/09/2019 1. Left ventricular ejection fraction, by estimation, is 55 to 60%. The  left ventricle has normal function. The left ventricle has no regional  wall motion abnormalities. There is moderate left ventricular hypertrophy.  Left ventricular diastolic  parameters are consistent with Grade I diastolic dysfunction (impaired  relaxation).   2. Right ventricular systolic function is normal. The right  ventricular  size is normal. There is normal pulmonary artery systolic pressure. The  estimated right ventricular systolic pressure is 38.7 mmHg.   3. Mild mitral valve regurgitation.   4. The aortic valve was not well visualized. Appears heavily calcified.  Aortic valve regurgitation is not visualized. Critical aortic valve  stenosis. Aortic valve area, by VTI measures 0.42 cm. Aortic valve mean  gradient measures 84.7 mmHg. Aortic  valve Vmax measures 5.51 m/s.   EKG:  EKG is  ordered today.  The ekg ordered today demonstrates 86 bpm with no acute ST/T wave changes. Minimal voltage criteria for LVH noted.  Recent Labs: 09/09/2019: TSH 1.224 09/12/2019: ALT 22 09/15/2019: Magnesium 2.2 10/01/2019: BUN 11; Creatinine, Ser 0.63; Hemoglobin 11.8; Platelets 444; Potassium 4.9; Sodium 130  Recent Lipid Panel    Component Value Date/Time   CHOL 146 09/09/2019 1323   TRIG 46 09/09/2019 1323   HDL 50 09/09/2019 1323   CHOLHDL 2.9 09/09/2019 1323   VLDL 9 09/09/2019 1323   LDLCALC 87 09/09/2019 1323    Home Medications   Current Meds  Medication Sig  . amiodarone (PACERONE) 200 MG tablet Take  1 tablet (200 mg total) by mouth 2 (two) times daily.  Marland Kitchen aspirin EC 81 MG EC tablet Take 1 tablet (81 mg total) by mouth daily. Swallow whole.  Marland Kitchen atorvastatin (LIPITOR) 40 MG tablet Take 1 tablet (40 mg total) by mouth daily.  . clopidogrel (PLAVIX) 75 MG tablet Take 1 tablet (75 mg total) by mouth daily.  . colchicine 0.6 MG tablet Take 1 tablet (0.6 mg total) by mouth 2 (two) times daily. Until finished  . metoprolol tartrate (LOPRESSOR) 25 MG tablet Take 1 tablet (25 mg total) by mouth 2 (two) times daily.  . nicotine (NICODERM CQ - DOSED IN MG/24 HOURS) 21 mg/24hr patch Place 1 patch (21 mg total) onto the skin daily.  . traMADol (ULTRAM) 50 MG tablet Take 1 tablet (50 mg total) by mouth every 6 (six) hours as needed for moderate pain or severe pain.  . [DISCONTINUED] amiodarone (PACERONE) 200  MG tablet Take 1 tablet (200 mg total) by mouth 2 (two) times daily. For 5 days then take 200 mg daily thereafter  . [DISCONTINUED] atorvastatin (LIPITOR) 40 MG tablet Take 1 tablet (40 mg total) by mouth daily.  . [DISCONTINUED] clopidogrel (PLAVIX) 75 MG tablet Take 1 tablet (75 mg total) by mouth daily.  . [DISCONTINUED] metoprolol tartrate (LOPRESSOR) 25 MG tablet Take 1 tablet (25 mg total) by mouth 2 (two) times daily.      Review of Systems      Review of Systems  Constitutional: Negative for chills, fever and malaise/fatigue.  Cardiovascular: Positive for dyspnea on exertion. Negative for chest pain, irregular heartbeat, leg swelling, near-syncope, orthopnea, palpitations and syncope.  Respiratory: Negative for cough, shortness of breath and wheezing.   Gastrointestinal: Negative for melena, nausea and vomiting.  Genitourinary: Negative for hematuria.  Neurological: Negative for dizziness, light-headedness and weakness.   All other systems reviewed and are otherwise negative except as noted above.  Physical Exam    VS:  BP 118/70   Pulse 86   Ht 5\' 3"  (1.6 m)   Wt 134 lb (60.8 kg)   SpO2 96%   BMI 23.74 kg/m  , BMI Body mass index is 23.74 kg/m. GEN: Well nourished, well developed, in no acute distress. HEENT: normal. Neck: Supple, no JVD, carotid bruits, or masses. Cardiac: RRR, no murmurs, rubs, or gallops. No clubbing, cyanosis, edema.  Radials/DP/PT 2+ and equal bilaterally.  Respiratory:  Respirations regular and unlabored, clear to auscultation bilaterally. GI: Soft, nontender, nondistended, BS + x 4. MS: No deformity or atrophy. Skin: Warm and dry, no rash.  Midsternal and chest tube incisions clean, dry, intact without evidence of infection.  All edges well approximated. Neuro:  Strength and sensation are intact. Psych: Normal affect.   Assessment & Plan    1. Severe AS s/p AVR -Postoperative echo scheduled for 11/02/2019.  Incisions healing appropriately.   Reports dyspnea on exertion but no lightheadedness, chest pain.  He will complete his 1 month course of colchicine.  BMP today for monitoring as he completed course of Lasix and potassium after discharge.  2. CAD s/p CABG x2 -Incisions healing appropriately.  Removed Steri-Strips from chest tube suture sites in clinic.  Edges well approximated.  GDMT aspirin, Lipitor, Plavix, and metoprolol.  Reviewed guideline directed medical therapy and indication for each medication.  Refills provided.  No ACE/ARB as he has relative hypotension at home.  Encouraged to participate in cardiac rehab, referral placed while admitted.  Low-sodium, healthy diet encouraged.  3. Lung nodule -  08/2019 7 mm nodule in LUL. Plan for repeat CT scan in 6 months.  Continue to follow with PCP.  4. HLD, LDL goal <70 - 09/09/19 LDL 87.  He was started on atorvastatin 40 mg daily while hospitalized.  Refills provided.  Plan for repeat lipid/liver profile at follow-up.  5. Prediabetes - A1c 5.8.  Low carbohydrate diet encouraged.  Has made marked improvements to his diet.  Continue to follow with PCP.  6. Tobacco use -He has not smoked a cigarette since hospital discharge and is using nicotine patch.  Sent prescription in for lower levels of nicotine patch. Smoking cessation encouraged. Recommend utilization of 1800QUITNOW.  7. Postoperative atrial fibrillation -treated with IV amiodarone while hospitalized for CABG and AVR.  No evidence of recurrent atrial fibrillation.  As atrial fibrillation was self-limited, no indication for anticoagulation at this time.  We will continue amiodarone 200 mg daily.  Hopefully we will be able to down titrate and/or discontinue in approximately 3 to 4 months when he is out of the immediate postoperative period.  If he has recurrent atrial fibrillation we will need to initiate anticoagulation.  Disposition: Follow up in 6 week(s) with Dr. Rockey Situ or APP   Loel Dubonnet, NP 10/02/2019, 9:19 AM

## 2019-10-01 NOTE — Patient Instructions (Addendum)
Medication Instructions:  No medication changes today.   You may discontinue the Colchicine when you run out.   Use the Nicotine 21mg  patch for one month. Then change to 14mg  patch for one month. Then change to 7mg  for one month. You can call 1-800-QUITNOW to request assistance with nicotine patches and support.  *If you need a refill on your cardiac medications before your next appointment, please call your pharmacy*  Lab Work: Your physician recommends lab work today: BMP, CBC  If you have labs (blood work) drawn today and your tests are completely normal, you will receive your results only by: Marland Kitchen MyChart Message (if you have MyChart) OR . A paper copy in the mail If you have any lab test that is abnormal or we need to change your treatment, we will call you to review the results.   Testing/Procedures: Your EKG today showed normal sinus rhythm.   Follow-Up: At [facility], you and your health needs are our priority.  As part of our continuing mission to provide you with exceptional heart care, we have created designated Provider Care Teams.  These Care Teams include your primary Cardiologist (physician) and Advanced Practice Providers (APPs -  Physician Assistants and Nurse Practitioners) who all work together to provide you with the care you need, when you need it.  We recommend signing up for the patient portal called "MyChart".  Sign up information is provided on this After Visit Summary.  MyChart is used to connect with patients for Virtual Visits (Telemedicine).  Patients are able to view lab/test results, encounter notes, upcoming appointments, etc.  Non-urgent messages can be sent to your provider as well.   To learn more about what you can do with MyChart, go to [email or web address].    Your next appointment:   6 week(s)  The format for your next appointment:   In Person  Provider:    You may see Ida Rogue, MD or one of the following Advanced Practice Providers  on your designated Care Team:   Murray Hodgkins, NP  Christell Faith, PA-C  Laurann Montana, NP  Marrianne Mood, PA-C  Other Instructions   You have been referred to cardiac rehab. This is a combination program including monitored exercise, dietary education, and support group. We strongly recommend participating in the program. Expect a phone call from them in approximately 3-4 weeks. If you do not hear from them, the phone number for cardiac rehab at [facility] is [phone].   Fat and Cholesterol Restricted Eating Plan Getting too much fat and cholesterol in your diet may cause health problems. Choosing the right foods helps keep your fat and cholesterol at normal levels. This can keep you from getting certain diseases. What are tips for following this plan? Meal planning  At meals, divide your plate into four equal parts: ? Fill one-half of your plate with vegetables and green salads. ? Fill one-fourth of your plate with whole grains. ? Fill one-fourth of your plate with low-fat (lean) protein foods.  Eat fish that is high in omega-3 fats at least two times a week. This includes mackerel, tuna, sardines, and salmon.  Eat foods that are high in fiber, such as whole grains, beans, apples, broccoli, carrots, peas, and barley. General tips   Work with your doctor to lose weight if you need to.  Avoid: ? Foods with added sugar. ? Fried foods. ? Foods with partially hydrogenated oils.  Limit alcohol intake to no more than 1 drink a day for nonpregnant  women and 2 drinks a day for men. One drink equals 12 oz of beer, 5 oz of wine, or 1 oz of hard liquor. Reading food labels  Check food labels for: ? Trans fats. ? Partially hydrogenated oils. ? Saturated fat (g) in each serving. ? Cholesterol (mg) in each serving. ? Fiber (g) in each serving.  Choose foods with healthy fats, such as: ? Monounsaturated fats. ? Polyunsaturated fats. ? Omega-3 fats.  Choose grain products that  have whole grains. Look for the word "whole" as the first word in the ingredient list. Cooking  Cook foods using low-fat methods. These include baking, boiling, grilling, and broiling.  Eat more home-cooked foods. Eat at restaurants and buffets less often.  Avoid cooking using saturated fats, such as butter, cream, palm oil, palm kernel oil, and coconut oil. Recommended foods  Fruits  All fresh, canned (in natural juice), or frozen fruits. Vegetables  Fresh or frozen vegetables (raw, steamed, roasted, or grilled). Green salads. Grains  Whole grains, such as whole wheat or whole grain breads, crackers, cereals, and pasta. Unsweetened oatmeal, bulgur, barley, quinoa, or brown rice. Corn or whole wheat flour tortillas. Meats and other protein foods  Ground beef (85% or leaner), grass-fed beef, or beef trimmed of fat. Skinless chicken or [place]. Ground chicken or [place]. Pork trimmed of fat. All fish and seafood. Egg whites. Dried beans, peas, or lentils. Unsalted nuts or seeds. Unsalted canned beans. Nut butters without added sugar or oil. Dairy  Low-fat or nonfat dairy products, such as skim or 1% milk, 2% or reduced-fat cheeses, low-fat and fat-free ricotta or cottage cheese, or plain low-fat and nonfat yogurt. Fats and oils  Tub margarine without trans fats. Light or reduced-fat mayonnaise and salad dressings. Avocado. Olive, canola, sesame, or safflower oils. The items listed above may not be a complete list of foods and beverages you can eat. Contact a dietitian for more information. Foods to avoid Fruits  Canned fruit in heavy syrup. Fruit in cream or butter sauce. Fried fruit. Vegetables  Vegetables cooked in cheese, cream, or butter sauce. Fried vegetables. Grains  White bread. White pasta. White rice. Cornbread. Bagels, pastries, and croissants. Crackers and snack foods that contain trans fat and hydrogenated oils. Meats and other protein foods  Fatty cuts of meat.  Ribs, chicken wings, bacon, sausage, bologna, salami, chitterlings, fatback, hot dogs, bratwurst, and packaged lunch meats. Liver and organ meats. Whole eggs and egg yolks. Chicken and [place] with skin. Fried meat. Dairy  Whole or 2% milk, cream, half-and-half, and cream cheese. Whole milk cheeses. Whole-fat or sweetened yogurt. Full-fat cheeses. Nondairy creamers and whipped toppings. Processed cheese, cheese spreads, and cheese curds. Beverages  Alcohol. Sugar-sweetened drinks such as sodas, lemonade, and fruit drinks. Fats and oils  Butter, stick margarine, lard, shortening, ghee, or bacon fat. Coconut, palm kernel, and palm oils. Sweets and desserts  Corn syrup, sugars, honey, and molasses. Candy. Jam and jelly. Syrup. Sweetened cereals. Cookies, pies, cakes, donuts, muffins, and ice cream. The items listed above may not be a complete list of foods and beverages you should avoid. Contact a dietitian for more information. Summary  Choosing the right foods helps keep your fat and cholesterol at normal levels. This can keep you from getting certain diseases.  At meals, fill one-half of your plate with vegetables and green salads.  Eat high-fiber foods, like whole grains, beans, apples, carrots, peas, and barley.  Limit added sugar, saturated fats, alcohol, and fried foods. This information is  not intended to replace advice given to you by your health care provider. Make sure you discuss any questions you have with your health care provider. Document Revised: 09/10/2017 Document Reviewed: 09/24/2016 Elsevier Patient Education  [place].

## 2019-10-02 LAB — CBC WITH DIFFERENTIAL/PLATELET
Basophils Absolute: 0.1 10*3/uL (ref 0.0–0.2)
Basos: 1 %
EOS (ABSOLUTE): 0.4 10*3/uL (ref 0.0–0.4)
Eos: 3 %
Hematocrit: 34.8 % — ABNORMAL LOW (ref 37.5–51.0)
Hemoglobin: 11.8 g/dL — ABNORMAL LOW (ref 13.0–17.7)
Immature Grans (Abs): 0 10*3/uL (ref 0.0–0.1)
Immature Granulocytes: 0 %
Lymphocytes Absolute: 1 10*3/uL (ref 0.7–3.1)
Lymphs: 7 %
MCH: 29.8 pg (ref 26.6–33.0)
MCHC: 33.9 g/dL (ref 31.5–35.7)
MCV: 88 fL (ref 79–97)
Monocytes Absolute: 1.6 10*3/uL — ABNORMAL HIGH (ref 0.1–0.9)
Monocytes: 11 %
Neutrophils Absolute: 11.2 10*3/uL — ABNORMAL HIGH (ref 1.4–7.0)
Neutrophils: 78 %
Platelets: 444 10*3/uL (ref 150–450)
RBC: 3.96 x10E6/uL — ABNORMAL LOW (ref 4.14–5.80)
RDW: 12.3 % (ref 11.6–15.4)
WBC: 14.5 10*3/uL — ABNORMAL HIGH (ref 3.4–10.8)

## 2019-10-02 LAB — BASIC METABOLIC PANEL
BUN/Creatinine Ratio: 17 (ref 9–20)
BUN: 11 mg/dL (ref 6–24)
CO2: 23 mmol/L (ref 20–29)
Calcium: 9.4 mg/dL (ref 8.7–10.2)
Chloride: 94 mmol/L — ABNORMAL LOW (ref 96–106)
Creatinine, Ser: 0.63 mg/dL — ABNORMAL LOW (ref 0.76–1.27)
GFR calc Af Amer: 126 mL/min/{1.73_m2} (ref >59)
GFR calc non Af Amer: 109 mL/min/{1.73_m2} (ref >59)
Glucose: 103 mg/dL — ABNORMAL HIGH (ref 65–99)
Potassium: 4.9 mmol/L (ref 3.5–5.2)
Sodium: 130 mmol/L — ABNORMAL LOW (ref 134–144)

## 2019-10-04 ENCOUNTER — Ambulatory Visit
Admission: RE | Admit: 2019-10-04 | Discharge: 2019-10-04 | Disposition: A | Discharge status: Home / Self Care | Source: Ambulatory Visit | Attending: Cardiothoracic Surgery | Admitting: Cardiothoracic Surgery

## 2019-10-04 ENCOUNTER — Ambulatory Visit (INDEPENDENT_AMBULATORY_CARE_PROVIDER_SITE_OTHER): Payer: Self-pay | Admitting: Cardiothoracic Surgery

## 2019-10-04 ENCOUNTER — Other Ambulatory Visit: Payer: Self-pay

## 2019-10-04 VITALS — BP 149/80 | HR 90 | Temp 97.7°F | Resp 20 | Ht 63.0 in | Wt 133.0 lb

## 2019-10-04 DIAGNOSIS — Z951 Presence of aortocoronary bypass graft: Secondary | ICD-10-CM

## 2019-10-04 NOTE — Progress Notes (Signed)
[address]       [place] [address]             [phone]     CARDIOTHORACIC SURGERY OFFICE NOTE  Referring Provider is Constance Haw, MD Primary Cardiologist is Ida Rogue, MD PCP is Crecencio Mc, MD   HPI:  59 year old gentleman presents for his first outpatient appointment status post CABG x2 and aortic valve replacement on 09/14/2019.  He did very well in the hospital was discharged in short order.  He has had no difficulties with shortness of breath or chest pain since being discharged.   Current Outpatient Medications  Medication Sig Dispense Refill  . amiodarone (PACERONE) 200 MG tablet Take 1 tablet (200 mg total) by mouth 2 (two) times daily. 90 tablet 1  . aspirin EC 81 MG EC tablet Take 1 tablet (81 mg total) by mouth daily. Swallow whole. 30 tablet 11  . atorvastatin (LIPITOR) 40 MG tablet Take 1 tablet (40 mg total) by mouth daily. 90 tablet 1  . clopidogrel (PLAVIX) 75 MG tablet Take 1 tablet (75 mg total) by mouth daily. 90 tablet 1  . colchicine 0.6 MG tablet Take 1 tablet (0.6 mg total) by mouth 2 (two) times daily. Until finished 52 tablet 0  . metoprolol tartrate (LOPRESSOR) 25 MG tablet Take 1 tablet (25 mg total) by mouth 2 (two) times daily. 180 tablet 1  . nicotine (NICODERM CQ - DOSED IN MG/24 HOURS) 14 mg/24hr patch Place 1 patch (14 mg total) onto the skin daily. 28 patch 0  . traMADol (ULTRAM) 50 MG tablet Take 1 tablet (50 mg total) by mouth every 6 (six) hours as needed for moderate pain or severe pain. 28 tablet 0  . nicotine (NICODERM CQ - DOSED IN MG/24 HOURS) 21 mg/24hr patch Place 1 patch (21 mg total) onto the skin daily. (Patient not taking: Reported on 10/04/2019) 28 patch 0  . nicotine (NICODERM CQ - DOSED IN MG/24 HR) 7 mg/24hr patch Place 1 patch (7 mg total) onto the skin daily. (Patient not taking: Reported on 10/04/2019) 28 patch 0   No current facility-administered medications for this visit.       Physical Exam:   BP (!) 149/80   Pulse 90   Temp 97.7 F (36.5 C) (Skin)   Resp 20   Ht 5\' 3"  (1.6 m)   Wt 60.3 kg   SpO2 96% Comment: RA  BMI 23.56 kg/m   General:  Well-appearing no acute distress  Chest:   Clear to auscultation bilaterally  CV:   Regular rate and rhythm  Incisions:  Healing well  Abdomen:  Nontender  Extremities:  No edema  Diagnostic Tests:  Chest x-ray with very small bilateral basilar effusions; clear lung fields   Impression:  Doing well after CABG AVR Plan:  Follow-up as needed Continue to observe sternal precautions for another couple of weeks Okay to stop colchicine now and amiodarone at the end of this month  I spent in excess of 15 minutes during the conduct of this office consultation and >50% of this time involved direct face-to-face encounter with the patient for counseling and/or coordination of their care.  Level 2                 10 minutes Level 3                 15 minutes Level 4  25 minutes Level 5                 40 minutes  B.  Murvin Natal, MD 10/04/2019 1:52 PM

## 2019-10-07 ENCOUNTER — Telehealth (INDEPENDENT_AMBULATORY_CARE_PROVIDER_SITE_OTHER): Admitting: Internal Medicine

## 2019-10-07 ENCOUNTER — Telehealth: Payer: Self-pay | Admitting: Internal Medicine

## 2019-10-07 ENCOUNTER — Encounter: Payer: Self-pay | Admitting: Internal Medicine

## 2019-10-07 VITALS — BP 107/65 | HR 76 | Ht 63.0 in | Wt 130.0 lb

## 2019-10-07 DIAGNOSIS — I2510 Atherosclerotic heart disease of native coronary artery without angina pectoris: Secondary | ICD-10-CM | POA: Diagnosis not present

## 2019-10-07 DIAGNOSIS — F17210 Nicotine dependence, cigarettes, uncomplicated: Secondary | ICD-10-CM

## 2019-10-07 DIAGNOSIS — Z79899 Other long term (current) drug therapy: Secondary | ICD-10-CM

## 2019-10-07 DIAGNOSIS — D62 Acute posthemorrhagic anemia: Secondary | ICD-10-CM

## 2019-10-07 DIAGNOSIS — E785 Hyperlipidemia, unspecified: Secondary | ICD-10-CM

## 2019-10-07 DIAGNOSIS — R7303 Prediabetes: Secondary | ICD-10-CM

## 2019-10-07 DIAGNOSIS — R918 Other nonspecific abnormal finding of lung field: Secondary | ICD-10-CM

## 2019-10-07 MED ORDER — TRAZODONE HCL 50 MG PO TABS: 25.0000 mg | ORAL_TABLET | Freq: Every evening | ORAL | 3 refills | Status: DC | PRN

## 2019-10-07 NOTE — Assessment & Plan Note (Addendum)
Has been tobacco free since August 19  With the use of nicotine patches

## 2019-10-07 NOTE — Telephone Encounter (Signed)
Pt is scheduled for 10/1 lab appt and 12/31/19 ov

## 2019-10-07 NOTE — Progress Notes (Signed)
Virtual Visit viaCaregility  This visit type was conducted due to national recommendations for restrictions regarding the COVID-19 pandemic (e.g. social distancing).  This format is felt to be most appropriate for this patient at this time.  All issues noted in this document were discussed and addressed.  No physical exam was performed (except for noted visual exam findings with Video Visits).   I connected with@ on 10/07/19 at 10:30 AM EDT by a video enabled telemedicine application  and verified that I am speaking with the correct person using two identifiers. Location patient: home Location provider: work or home office Persons participating in the virtual visit: patient, provider  I discussed the limitations, risks, security and privacy concerns of performing an evaluation and management service by telephone and the availability of in person appointments. I also discussed with the patient that there may be a patient responsible charge related to this service. The patient expressed understanding and agreed to proceed.  Reason for visit: establish care , referred by his wife Dean Obrien  HPI:  59 yr old male with history of hypertension,  hyperlipidemia ,  Prediabetes, and heavy tobacco abuse,  recently discharged from  [facility] on August 29  after being admitted to [facility] on August 18 with critical aortic stenosis and  NSTEMI.  Patient presented with post exertional chest pain, dyspnea and syncope. He was transferred to [facility] on August 20 for 2 vessel CABG and aortic valve replacement.  He developed post operative hypokalemia, anemia and transient atrial fibrillation  Which was treated with supplementation, transfusion (per patient but not documented in progress notes or discharge summary)  ,  And amiodarine drip.   He has had cardiology follow up since discharge f. His  Amiodarone dose  was reduced to 200 mg daily and colchicine not refilled.Marland Kitchen BMET and CBC repeated.   Taking asa, plavix and statin.   He  feels better post discharge.  Has quit smoking, using nicotine patches. Weight has stabilized after losing 12 lbs and appetite has improved.  Having trouble sleeping, however.  Pulmonary nodule: < 1 cm noted during workup.   Needs 6 month follow up CT   Insomnia  Chronic.  Every 1.5 hours.  Has tried zQuil too sedating. Nocturia x 1 .  Listens for [employer] .  In same room .  Prior trial of melatonin  did not keep him asleep.  FitBIt says he is averagig 5.5 hrs of sleep,  most of it light sleep,    Denies pain. Some surgical incision pain .   Takes tramadol rarely  Saw CT surgeon  colchicine stopped and amiodarone  Continued through September   Prediabetes:  Has changed eating habits since discharge.     ROS: See pertinent positives and negatives per HPI.  Past Medical History:  Diagnosis Date  . Aortic stenosis, severe 09/09/2018   RHC 09/10/19 Heavily calcified AV. VTI 0.42 cm. AV mean gradient 84.7 mmHg. Vmax measures 5.51 m/s. nl filling pressures, nl pulmonary pressure, nl CO.  Marland Kitchen CAD (coronary artery disease)    LHC 09/10/2019: pRCA 90%s, pLCx 20%s, mLAD 50%s  . Critical aortic valve stenosis 09/09/2019  . Diastolic dysfunction    3/38/2505  . Hyperlipidemia LDL goal <70    8/19 LDL 87  . Hypertension   . NSTEMI (non-ST elevated myocardial infarction) ([place]) 09/09/2019  . Prediabetes     Past Surgical History:  Procedure Laterality Date  . AORTIC VALVE REPLACEMENT N/A 09/14/2019   Procedure: AORTIC VALVE REPLACEMENT (AVR) USING INSPIRIS  VALVE SIZE 25MM;  Surgeon: Wonda Olds, MD;  Location: [place];  Service: Open Heart Surgery;  Laterality: N/A;  . CORONARY ARTERY BYPASS GRAFT N/A 09/14/2019   Procedure: CORONARY ARTERY BYPASS GRAFTING (CABG) x Two using bilateral Internal mammary arteries;  Surgeon: Wonda Olds, MD;  [facility];  Service: Open Heart Surgery;  Laterality: N/A;  . RIGHT/LEFT HEART CATH AND CORONARY ANGIOGRAPHY N/A 09/10/2019   Procedure:  RIGHT/LEFT HEART CATH AND CORONARY ANGIOGRAPHY;  Surgeon: Wellington Hampshire, MD;  Location: [place] CV LAB;  Service: Cardiovascular;  Laterality: N/A;  . TEE WITHOUT CARDIOVERSION N/A 09/14/2019   Procedure: TRANSESOPHAGEAL ECHOCARDIOGRAM (TEE);  Surgeon: Wonda Olds, MD;  Location: [place];  Service: Open Heart Surgery;  Laterality: N/A;    Family History  Problem Relation Age of Onset  . Emphysema Mother   . Liver disease Mother   . Lung cancer Father   . Hypertension Sister   . 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  He is primary caregiver to wife Dean Obrien who was MS and is bedbound.   reports that he quit smoking about 4 weeks ago. He has a 52.50 pack-year smoking history. He has never used smokeless tobacco. He reports that he does not drink alcohol and does not use drugs.  Current Outpatient Medications:  .  amiodarone (PACERONE) 200 MG tablet, Take 1 tablet (200 mg total) by mouth 2 (two) times daily., Disp: 90 tablet, Rfl: 1 .  aspirin EC 81 MG EC tablet, Take 1 tablet (81 mg total) by mouth daily. Swallow whole., Disp: 30 tablet, Rfl: 11 .  atorvastatin (LIPITOR) 40 MG tablet, Take 1 tablet (40 mg total) by mouth daily., Disp: 90 tablet, Rfl: 1 .  clopidogrel (PLAVIX) 75 MG tablet, Take 1 tablet (75 mg total) by mouth daily., Disp: 90 tablet, Rfl: 1 .  metoprolol tartrate (LOPRESSOR) 25 MG tablet, Take 1 tablet (25 mg total) by mouth 2 (two) times daily., Disp: 180 tablet, Rfl: 1 .  nicotine (NICODERM CQ - DOSED IN MG/24 HOURS) 21 mg/24hr patch, Place 1 patch (21 mg total) onto the skin daily., Disp: 28 patch, Rfl: 0 .  traMADol (ULTRAM) 50 MG tablet, Take 1 tablet (50 mg total) by mouth every 6 (six) hours as needed for moderate pain or severe pain., Disp: 28 tablet, Rfl: 0 .  nicotine (NICODERM CQ - DOSED IN MG/24 HOURS) 14 mg/24hr patch, Place 1 patch (14 mg total) onto the skin daily.  (Patient not taking: Reported on 10/07/2019), Disp: 28 patch, Rfl: 0 .  nicotine (NICODERM CQ - DOSED IN MG/24 HR) 7 mg/24hr patch, Place 1 patch (7 mg total) onto the skin daily. (Patient not taking: Reported on 10/04/2019), Disp: 28 patch, Rfl: 0 .  traZODone (DESYREL) 50 MG tablet, Take 0.5-1 tablets (25-50 mg total) by mouth at bedtime as needed for sleep., Disp: 90 tablet, Rfl: 3  EXAM:  VITALS per patient if applicable:  GENERAL: alert, oriented, appears well and in no acute distress  HEENT: atraumatic, conjunttiva clear, no obvious abnormalities on inspection of external nose and ears  NECK: normal movements of the head and neck  LUNGS: on inspection no signs of respiratory distress, breathing rate appears normal, no obvious gross SOB, gasping or wheezing  CV: no obvious cyanosis  MS: moves all visible extremities without noticeable abnormality  PSYCH/NEURO: pleasant and cooperative, no obvious  depression or anxiety, speech and thought processing grossly intact  ASSESSMENT AND PLAN:  Discussed the following assessment and plan:  Long-term use of high-risk medication - Plan: Comprehensive metabolic panel  Cigarette nicotine dependence without complication  Acute blood loss as cause of postoperative anemia - Plan: CBC with Differential/Platelet  Coronary artery disease involving native coronary artery of native heart, angina presence unspecified  Multiple pulmonary nodules determined by computed tomography of lung - Plan: AMB  Referral to Pulmonary Nodule Clinic  Prediabetes  Hyperlipidemia LDL goal <70  Nicotine dependence Has been tobacco free since August 19  With the use of nicotine patches   CAD (coronary artery disease) S/p 2 vessel CABG at [place]  By Dr Servando Snare September 10 2019.  Continue asa, plavix, metoprolol and lipitor  BP has been too soft to add ARB  Multiple pulmonary nodules determined by computed tomography of lung Found during recent admission.  Given  his history of heavy tobacco use  6 month follow imaging is needed and has been ordered  Prediabetes Diet reviewed.  Wt loss reviewed.  Repeat a1c in 6 months   Hyperlipidemia LDL goal <70 Taking lipitor . LDL is at [place].  Repeat in 3 months with a1c   Lab Results  Component Value Date   CHOL 146 09/09/2019   HDL 50 09/09/2019   LDLCALC 87 09/09/2019   TRIG 46 09/09/2019   CHOLHDL 2.9 09/09/2019     Acute blood loss as cause of postoperative anemia hgb has improved by 2 pt since discharge Lab Results  Component Value Date   WBC 14.5 (H) 10/01/2019   HGB 11.8 (L) 10/01/2019   HCT 34.8 (L) 10/01/2019   MCV 88 10/01/2019   PLT 444 10/01/2019       I discussed the assessment and treatment plan with the patient. The patient was provided an opportunity to ask questions and all were answered. The patient agreed with the plan and demonstrated an understanding of the instructions.   The patient was advised to call back or seek an in-person evaluation if the symptoms worsen or if the condition fails to improve as anticipated.  I provided  45 minutes of face-to-face time during this encounter.   Crecencio Mc, MD

## 2019-10-07 NOTE — Patient Instructions (Addendum)
Trial of trazodone   For insomnia.  Start with 1/2 tablet 60 minutes before bedtime .  Repeat if not sleepy by 30 minutes before sleep    You can add up to 2000 mg of acetominophen (tylenol) every day safely  In divided doses (500 mg every 6 hours  Or 1000 mg every 12 hours.)   Office will call you with an appointment for labs (non fasting) I nearly October    Return to clinic in late November /early December for follow up on prediabetes, etc Lab Results  Component Value Date   HGBA1C 5.8 (H) 09/13/2019

## 2019-10-07 NOTE — Telephone Encounter (Signed)
Lm to call office to schedule non-fasting labs around Oct 1st and OV in December.

## 2019-10-09 ENCOUNTER — Encounter: Payer: Self-pay | Admitting: Internal Medicine

## 2019-10-09 DIAGNOSIS — D62 Acute posthemorrhagic anemia: Secondary | ICD-10-CM | POA: Insufficient documentation

## 2019-10-09 DIAGNOSIS — R918 Other nonspecific abnormal finding of lung field: Secondary | ICD-10-CM | POA: Insufficient documentation

## 2019-10-09 NOTE — Assessment & Plan Note (Signed)
Found during recent admission.  Given his history of heavy tobacco use  6 month follow imaging is needed and has been ordered

## 2019-10-09 NOTE — Assessment & Plan Note (Addendum)
hgb has improved by 2 pt since discharge Lab Results  Component Value Date   WBC 14.5 (H) 10/01/2019   HGB 11.8 (L) 10/01/2019   HCT 34.8 (L) 10/01/2019   MCV 88 10/01/2019   PLT 444 10/01/2019

## 2019-10-09 NOTE — Assessment & Plan Note (Signed)
Taking lipitor . LDL is at [place].  Repeat in 3 months with a1c   Lab Results  Component Value Date   CHOL 146 09/09/2019   HDL 50 09/09/2019   LDLCALC 87 09/09/2019   TRIG 46 09/09/2019   CHOLHDL 2.9 09/09/2019

## 2019-10-09 NOTE — Assessment & Plan Note (Signed)
S/p 2 vessel CABG at [facility]  By Dr Servando Snare September 10 2019.  Continue asa, plavix, metoprolol and lipitor  BP has been too soft to add ARB

## 2019-10-09 NOTE — Assessment & Plan Note (Signed)
Diet reviewed.  Wt loss reviewed.  Repeat a1c in 6 months

## 2019-10-22 ENCOUNTER — Other Ambulatory Visit: Payer: Self-pay

## 2019-10-22 ENCOUNTER — Other Ambulatory Visit (INDEPENDENT_AMBULATORY_CARE_PROVIDER_SITE_OTHER)

## 2019-10-22 DIAGNOSIS — D62 Acute posthemorrhagic anemia: Secondary | ICD-10-CM

## 2019-10-22 DIAGNOSIS — Z79899 Other long term (current) drug therapy: Secondary | ICD-10-CM

## 2019-10-22 LAB — COMPREHENSIVE METABOLIC PANEL
ALT: 13 U/L (ref 0–53)
AST: 13 U/L (ref 0–37)
Albumin: 4 g/dL (ref 3.5–5.2)
Alkaline Phosphatase: 76 U/L (ref 39–117)
BUN: 16 mg/dL (ref 6–23)
CO2: 25 mEq/L (ref 19–32)
Calcium: 9.1 mg/dL (ref 8.4–10.5)
Chloride: 101 mEq/L (ref 96–112)
Creatinine, Ser: 0.7 mg/dL (ref 0.40–1.50)
GFR: 115.74 mL/min (ref >60.00)
Glucose, Bld: 144 mg/dL — ABNORMAL HIGH (ref 70–99)
Potassium: 4.5 mEq/L (ref 3.5–5.1)
Sodium: 134 mEq/L — ABNORMAL LOW (ref 135–145)
Total Bilirubin: 0.4 mg/dL (ref 0.2–1.2)
Total Protein: 7.1 g/dL (ref 6.0–8.3)

## 2019-10-22 LAB — CBC WITH DIFFERENTIAL/PLATELET
Basophils Absolute: 0.1 10*3/uL (ref 0.0–0.1)
Basophils Relative: 1.1 % (ref 0.0–3.0)
Eosinophils Absolute: 0.2 10*3/uL (ref 0.0–0.7)
Eosinophils Relative: 2.2 % (ref 0.0–5.0)
HCT: 35.1 % — ABNORMAL LOW (ref 39.0–52.0)
Hemoglobin: 11.8 g/dL — ABNORMAL LOW (ref 13.0–17.0)
Lymphocytes Relative: 10.3 % — ABNORMAL LOW (ref 12.0–46.0)
Lymphs Abs: 0.8 10*3/uL (ref 0.7–4.0)
MCHC: 33.7 g/dL (ref 30.0–36.0)
MCV: 88.7 fl (ref 78.0–100.0)
Monocytes Absolute: 0.6 10*3/uL (ref 0.1–1.0)
Monocytes Relative: 7.9 % (ref 3.0–12.0)
Neutro Abs: 6.3 10*3/uL (ref 1.4–7.7)
Neutrophils Relative %: 78.5 % — ABNORMAL HIGH (ref 43.0–77.0)
Platelets: 255 10*3/uL (ref 150.0–400.0)
RBC: 3.95 Mil/uL — ABNORMAL LOW (ref 4.22–5.81)
RDW: 15 % (ref 11.5–15.5)
WBC: 8.1 10*3/uL (ref 4.0–10.5)

## 2019-10-24 NOTE — Progress Notes (Signed)
Your hemoglobin is stable but you still have an anemia that will need further evaluation.  Please schedule a virtual visit to discuss.

## 2019-10-25 ENCOUNTER — Telehealth: Payer: Self-pay

## 2019-10-25 NOTE — Telephone Encounter (Signed)
Pt needs a virtual visit to discuss lab results per Dr Derrel Nip. There are no appts available-please advise

## 2019-10-25 NOTE — Telephone Encounter (Signed)
Spoke with Dean Obrien and scheduled him for a virtual visit with Dr. Derrel Nip on Friday. Dean Obrien is aware of appt date and time.

## 2019-10-29 ENCOUNTER — Other Ambulatory Visit: Payer: Self-pay

## 2019-10-29 ENCOUNTER — Telehealth (INDEPENDENT_AMBULATORY_CARE_PROVIDER_SITE_OTHER): Admitting: Internal Medicine

## 2019-10-29 ENCOUNTER — Encounter: Payer: Self-pay | Admitting: Internal Medicine

## 2019-10-29 VITALS — BP 110/65 | HR 65 | Ht 63.0 in | Wt 130.0 lb

## 2019-10-29 DIAGNOSIS — D62 Acute posthemorrhagic anemia: Secondary | ICD-10-CM

## 2019-10-29 DIAGNOSIS — R7303 Prediabetes: Secondary | ICD-10-CM | POA: Diagnosis not present

## 2019-10-29 DIAGNOSIS — Z6710 Type A blood, Rh positive: Secondary | ICD-10-CM

## 2019-10-29 DIAGNOSIS — E785 Hyperlipidemia, unspecified: Secondary | ICD-10-CM

## 2019-10-29 MED ORDER — FLUTICASONE PROPIONATE 50 MCG/ACT NA SUSP: 2.0000 | Freq: Every day | NASAL | 6 refills | Status: DC

## 2019-10-29 NOTE — Progress Notes (Signed)
Virtual Visit via [place]  This visit type was conducted due to national recommendations for restrictions regarding the COVID-19 pandemic (e.g. social distancing).  This format is felt to be most appropriate for this patient at this time.  All issues noted in this document were discussed and addressed.  No physical exam was performed (except for noted visual exam findings with Video Visits).   I connected with@ on 10/29/19 at 10:30 AM EDT by a video enabled telemedicine application and verified that I am speaking with the correct person using two identifiers. Location patient: home Location provider: work or home office Persons participating in the virtual visit: patient, provider  I discussed the limitations, risks, security and privacy concerns of performing an evaluation and management service by telephone and the availability of in person appointments. I also discussed with the patient that there may be a patient responsible charge related to this service. The patient expressed understanding and agreed to proceed.  Reason for visit: follow up on anemia   HPI:   59 yr old male recently admitted August 2021 with critical aortic stenosis and 2 vessel disease  S/p NSTEMI, s/p 2 vessel CABG and aortic valve stenosis presents for follow up on recent labs done after discharge.    He feels generally well.  Following a low GI diet . Fatigue is improving  Some nasal congestion and rhintis attributed  to seasonal allergies.     ROS: See pertinent positives and negatives per HPI.  Past Medical History:  Diagnosis Date  . Aortic stenosis, severe 09/09/2018   RHC 09/10/19 Heavily calcified AV. VTI 0.42 cm. AV mean gradient 84.7 mmHg. Vmax measures 5.51 m/s. nl filling pressures, nl pulmonary pressure, nl CO.  Marland Kitchen CAD (coronary artery disease)    LHC 09/10/2019: pRCA 90%s, pLCx 20%s, mLAD 50%s  . Critical aortic valve stenosis 09/09/2019  . Diastolic dysfunction    5/46/5681  . Hyperlipidemia  LDL goal <70    8/19 LDL 87  . Hypertension   . NSTEMI (non-ST elevated myocardial infarction) ([place]) 09/09/2019  . Prediabetes     Past Surgical History:  Procedure Laterality Date  . AORTIC VALVE REPLACEMENT N/A 09/14/2019   Procedure: AORTIC VALVE REPLACEMENT (AVR) USING INSPIRIS VALVE SIZE 25MM;  Surgeon: Wonda Olds, MD;  Location: [place];  Service: Open Heart Surgery;  Laterality: N/A;  . CORONARY ARTERY BYPASS GRAFT N/A 09/14/2019   Procedure: CORONARY ARTERY BYPASS GRAFTING (CABG) x Two using bilateral Internal mammary arteries;  Surgeon: Wonda Olds, MD;  [facility];  Service: Open Heart Surgery;  Laterality: N/A;  . RIGHT/LEFT HEART CATH AND CORONARY ANGIOGRAPHY N/A 09/10/2019   Procedure: RIGHT/LEFT HEART CATH AND CORONARY ANGIOGRAPHY;  Surgeon: Wellington Hampshire, MD;  Location: [place] CV LAB;  Service: Cardiovascular;  Laterality: N/A;  . TEE WITHOUT CARDIOVERSION N/A 09/14/2019   Procedure: TRANSESOPHAGEAL ECHOCARDIOGRAM (TEE);  Surgeon: Wonda Olds, MD;  Location: [place];  Service: Open Heart Surgery;  Laterality: N/A;    Family History  Problem Relation Age of Onset  . Emphysema Mother   . Liver disease Mother   . Lung cancer Father   . Hypertension Sister   . Hypertension Brother     SOCIAL HX:  reports that he quit smoking about 7 weeks ago. He has a 52.50 pack-year smoking history. He has never used smokeless tobacco. He reports that he does not drink alcohol and does not use drugs.   Current Outpatient Medications:  .  aspirin EC  81 MG EC tablet, Take 1 tablet (81 mg total) by mouth daily. Swallow whole., Disp: 30 tablet, Rfl: 11 .  atorvastatin (LIPITOR) 40 MG tablet, Take 1 tablet (40 mg total) by mouth daily., Disp: 90 tablet, Rfl: 1 .  clopidogrel (PLAVIX) 75 MG tablet, Take 1 tablet (75 mg total) by mouth daily., Disp: 90 tablet, Rfl: 1 .  metoprolol tartrate (LOPRESSOR) 25 MG tablet, Take 1 tablet (25 mg total) by mouth 2 (two) times  daily., Disp: 180 tablet, Rfl: 1 .  nicotine (NICODERM CQ - DOSED IN MG/24 HOURS) 14 mg/24hr patch, Place 1 patch (14 mg total) onto the skin daily., Disp: 28 patch, Rfl: 0 .  traMADol (ULTRAM) 50 MG tablet, Take 1 tablet (50 mg total) by mouth every 6 (six) hours as needed for moderate pain or severe pain., Disp: 28 tablet, Rfl: 0 .  traZODone (DESYREL) 50 MG tablet, Take 0.5-1 tablets (25-50 mg total) by mouth at bedtime as needed for sleep., Disp: 90 tablet, Rfl: 3 .  fluticasone (FLONASE) 50 MCG/ACT nasal spray, Place 2 sprays into both nostrils daily., Disp: 16 g, Rfl: 6 .  nicotine (NICODERM CQ - DOSED IN MG/24 HR) 7 mg/24hr patch, Place 1 patch (7 mg total) onto the skin daily. (Patient not taking: Reported on 10/29/2019), Disp: 28 patch, Rfl: 0  EXAM:  VITALS per patient if applicable:  GENERAL: alert, oriented, appears well and in no acute distress  HEENT: atraumatic, conjunttiva clear, no obvious abnormalities on inspection of external nose and ears  NECK: normal movements of the head and neck  LUNGS: on inspection no signs of respiratory distress, breathing rate appears normal, no obvious gross SOB, gasping or wheezing  CV: no obvious cyanosis  MS: moves all visible extremities without noticeable abnormality  PSYCH/NEURO: pleasant and cooperative, no obvious depression or anxiety, speech and thought processing grossly intact  ASSESSMENT AND PLAN:  Discussed the following assessment and plan:  Acute blood loss as cause of postoperative anemia - Plan: IBC + Ferritin, Vitamin B12, RBC Folate, Erythropoietin, Fecal occult blood, imunochemical  Hyperlipidemia LDL goal <70 - Plan: Lipid Panel With LDL/HDL Ratio, CANCELED: Lipid Panel With LDL/HDL Ratio  Type A blood, Rh positive  Prediabetes  Hyperlipidemia LDL goal <70 Now managed with atorvastatin 40 mg since discharge in late August . Fasting lipids are due   Acute blood loss as cause of postoperative anemia   Preadmission hemoglobin was normal at 15-16  ,  Dropped to 14 pre surgery and 9 post surgery  He  Appears to have been transfused 2 units prior to discharge  will return for additional labs   Lab Results  Component Value Date   WBC 8.1 10/22/2019   HGB 11.8 (L) 10/22/2019   HCT 35.1 (L) 10/22/2019   MCV 88.7 10/22/2019   PLT 255.0 10/22/2019     Prediabetes He has total revised his diet and has lost 12 lbs.   Repeat a1c in 6 months   Lab Results  Component Value Date   HGBA1C 5.8 (H) 09/13/2019       I discussed the assessment and treatment plan with the patient. The patient was provided an opportunity to ask questions and all were answered. The patient agreed with the plan and demonstrated an understanding of the instructions.   The patient was advised to call back or seek an in-person evaluation if the symptoms worsen or if the condition fails to improve as anticipated.  I provided 20 minutes of face-to-face time  during this encounter.   Crecencio Mc, MD

## 2019-10-30 DIAGNOSIS — Z6710 Type A blood, Rh positive: Secondary | ICD-10-CM | POA: Insufficient documentation

## 2019-10-30 NOTE — Assessment & Plan Note (Signed)
Now managed with atorvastatin 40 mg since discharge in late August . Fasting lipids are due

## 2019-10-30 NOTE — Assessment & Plan Note (Addendum)
Preadmission hemoglobin was normal at 15-16  ,  Dropped to 14 pre surgery and 9 post surgery  He  Appears to have been transfused 2 units prior to discharge  will return for additional labs   Lab Results  Component Value Date   WBC 8.1 10/22/2019   HGB 11.8 (L) 10/22/2019   HCT 35.1 (L) 10/22/2019   MCV 88.7 10/22/2019   PLT 255.0 10/22/2019

## 2019-10-30 NOTE — Assessment & Plan Note (Signed)
He has total revised his diet and has lost 12 lbs.   Repeat a1c in 6 months   Lab Results  Component Value Date   HGBA1C 5.8 (H) 09/13/2019

## 2019-11-02 ENCOUNTER — Other Ambulatory Visit: Payer: Self-pay

## 2019-11-02 ENCOUNTER — Ambulatory Visit (INDEPENDENT_AMBULATORY_CARE_PROVIDER_SITE_OTHER)

## 2019-11-02 DIAGNOSIS — I359 Nonrheumatic aortic valve disorder, unspecified: Secondary | ICD-10-CM | POA: Diagnosis not present

## 2019-11-02 LAB — ECHOCARDIOGRAM COMPLETE
AR max vel: 1.7 cm2
AV Area VTI: 1.66 cm2
AV Area mean vel: 1.74 cm2
AV Mean grad: 8 mmHg
AV Peak grad: 15.7 mmHg
Ao pk vel: 1.98 m/s
Area-P 1/2: 3.58 cm2
Calc EF: 48.6 %
S' Lateral: 3.6 cm
Single Plane A2C EF: 48.2 %
Single Plane A4C EF: 48.9 %

## 2019-11-08 NOTE — Progress Notes (Signed)
Cardiology Office Note  Date:  11/09/2019   ID:  Dean Obrien, DOB [birth year], MRN [id number]  PCP:  Crecencio Mc, MD   Chief Complaint  Patient presents with  . Other    6 week follow up. meds reviewed verbally with patient.     HPI:  59 year old gentleman PMH of  smoking CAD Severe aortic valve stenosis status post CABG x2 and aortic valve replacement on 09/14/2019.   Discussed his recovery Feeling well, getting better  Active Trazodone for sleep Breathing better  Reports a suture is poking through the chest Mild discomfort, he has not talked with surgical team  He declined EKG today  Recap of recent events discussed with him unstable angina sx with syncope  in 08/2019, leading to hospitalization,   Echo 09/09/2019 with critical aortic stenosis with valve area by VTI 0.42 cm, AV mean gradient 84.7 mmHg, aortic valve V-max 5.15 m/s, LVEF 55 to 60%, no RWMA, gr1DD, mild MR.   R/LHC 09/10/2019 significant one-vessel CAD with P RCA 90% stenosed, P LCx 20% stenosis, moderate M LAD 50% stenosis.  CTA with probable bicuspid aortic valve.  Ascending aorta mildly enlarged at 3.7 cm tapering to 3.4 cm in arch.  He had a 7 mm nodule in LUL that will require CT follow-up in 6 months due to his tobacco use.  08/25/2019 he underwent CABGx2 (LIM-LAD, RIMA-RCA) and AVR with 25 mm Edwards Inspiris bioprosthetic.  He went into atrial fibrillation in the postoperative period and was treated with amiodarone drip.   PMH:   has a past medical history of Aortic stenosis, severe (09/09/2018), CAD (coronary artery disease), Critical aortic valve stenosis (8/52/7782), Diastolic dysfunction, Hyperlipidemia LDL goal <70, Hypertension, NSTEMI (non-ST elevated myocardial infarction) ([place]) (09/09/2019), and Prediabetes.  PSH:    Past Surgical History:  Procedure Laterality Date  . AORTIC VALVE REPLACEMENT N/A 09/14/2019   Procedure: AORTIC VALVE REPLACEMENT (AVR) USING INSPIRIS VALVE SIZE 25MM;   Surgeon: Wonda Olds, MD;  Location: [place];  Service: Open Heart Surgery;  Laterality: N/A;  . CORONARY ARTERY BYPASS GRAFT N/A 09/14/2019   Procedure: CORONARY ARTERY BYPASS GRAFTING (CABG) x Two using bilateral Internal mammary arteries;  Surgeon: Wonda Olds, MD;  [facility];  Service: Open Heart Surgery;  Laterality: N/A;  . RIGHT/LEFT HEART CATH AND CORONARY ANGIOGRAPHY N/A 09/10/2019   Procedure: RIGHT/LEFT HEART CATH AND CORONARY ANGIOGRAPHY;  Surgeon: Wellington Hampshire, MD;  Location: [place] [facility];  Service: Cardiovascular;  Laterality: N/A;  . TEE WITHOUT CARDIOVERSION N/A 09/14/2019   Procedure: TRANSESOPHAGEAL ECHOCARDIOGRAM (TEE);  Surgeon: Wonda Olds, MD;  Location: [place];  Service: Open Heart Surgery;  Laterality: N/A;    Current Outpatient Medications  Medication Sig Dispense Refill  . aspirin EC 81 MG EC tablet Take 1 tablet (81 mg total) by mouth daily. Swallow whole. 30 tablet 11  . atorvastatin (LIPITOR) 40 MG tablet Take 1 tablet (40 mg total) by mouth daily. 90 tablet 1  . clopidogrel (PLAVIX) 75 MG tablet Take 1 tablet (75 mg total) by mouth daily. 90 tablet 1  . fluticasone (FLONASE) 50 MCG/ACT nasal spray Place 2 sprays into both nostrils daily. 16 g 6  . metoprolol tartrate (LOPRESSOR) 25 MG tablet Take 1 tablet (25 mg total) by mouth 2 (two) times daily. 180 tablet 1  . nicotine (NICODERM CQ - DOSED IN MG/24 HOURS) 14 mg/24hr patch Place 1 patch (14 mg total) onto the skin daily. 28 patch 0  .  nicotine (NICODERM CQ - DOSED IN MG/24 HR) 7 mg/24hr patch Place 1 patch (7 mg total) onto the skin daily. 28 patch 0  . traMADol (ULTRAM) 50 MG tablet Take 1 tablet (50 mg total) by mouth every 6 (six) hours as needed for moderate pain or severe pain. 28 tablet 0  . traZODone (DESYREL) 50 MG tablet Take 0.5-1 tablets (25-50 mg total) by mouth at bedtime as needed for sleep. 90 tablet 3   No current facility-administered medications for this visit.      Allergies:   Benadryl [diphenhydramine]   Social History:  The patient  reports that he quit smoking about 2 months ago. He has a 52.50 pack-year smoking history. He has never used smokeless tobacco. He reports that he does not drink alcohol and does not use drugs.   Family History:   family history includes Emphysema in his mother; Hypertension in his brother and sister; Liver disease in his mother; Lung cancer in his father.    Review of Systems: Review of Systems  Constitutional: Negative.   HENT: Negative.   Respiratory: Negative.   Cardiovascular: Negative.   Gastrointestinal: Negative.   Musculoskeletal: Negative.   Neurological: Negative.   Psychiatric/Behavioral: Negative.   All other systems reviewed and are negative.   PHYSICAL EXAM: VS:  BP (!) 150/70 (BP Location: Left Arm, Patient Position: Sitting, Cuff Size: Normal)   Pulse 76   Ht 5\' 3"  (1.6 m)   Wt 136 lb (61.7 kg)   SpO2 98%   BMI 24.09 kg/m  , BMI Body mass index is 24.09 kg/m. GEN: Well nourished, well developed, in no acute distress HEENT: normal Neck: no JVD, carotid bruits, or masses Cardiac: RRR; no murmurs, rubs, or gallops,no edema  Respiratory:  clear to auscultation bilaterally, normal work of breathing GI: soft, nontender, nondistended, + BS MS: no deformity or atrophy Skin: warm and dry, no rash Neuro:  Strength and sensation are intact Psych: euthymic mood, full affect   Recent Labs: 09/09/2019: TSH 1.224 09/15/2019: Magnesium 2.2 10/22/2019: ALT 13; BUN 16; Creatinine, Ser 0.70; Hemoglobin 11.8; Platelets 255.0; Potassium 4.5; Sodium 134    Lipid Panel Lab Results  Component Value Date   CHOL 146 09/09/2019   HDL 50 09/09/2019   LDLCALC 87 09/09/2019   TRIG 46 09/09/2019      Wt Readings from Last 3 Encounters:  11/09/19 136 lb (61.7 kg)  10/29/19 130 lb (59 kg)  10/07/19 130 lb (59 kg)     ASSESSMENT AND PLAN:  Problem List Items Addressed This Visit       Cardiology Problems   Hyperlipidemia LDL goal <70   Coronary artery disease - Primary    Other Visit Diagnoses    Severe aortic stenosis       S/P AVR       Hx of CABG         CAD with stable angina Grafting x2, denies any anginal symptoms Reports his breathing is much improved Recommended regular exercise program He has stopped smoking, lipid panel pending  AVR Details discussed, he preferred bioprosthetic valve not a mechanical valve Did not want anticoagulation long-term  Hyperlipidemia Labs pending, goal LDL less than 70  He will follow-up with CT surgery concerning the stitch protruding from his incision  Disposition:   F/U  6 months   Total encounter time more than 25 minutes  Greater than 50% was spent in counseling and coordination of care with the patient    Signed,  Esmond Plants, M.D., Ph.D. [place], [place]

## 2019-11-09 ENCOUNTER — Ambulatory Visit (INDEPENDENT_AMBULATORY_CARE_PROVIDER_SITE_OTHER): Admitting: Cardiovascular Disease

## 2019-11-09 ENCOUNTER — Other Ambulatory Visit: Payer: Self-pay

## 2019-11-09 ENCOUNTER — Other Ambulatory Visit (INDEPENDENT_AMBULATORY_CARE_PROVIDER_SITE_OTHER)

## 2019-11-09 ENCOUNTER — Encounter: Payer: Self-pay | Admitting: Cardiovascular Disease

## 2019-11-09 VITALS — BP 125/65 | HR 76 | Ht 63.0 in | Wt 136.0 lb

## 2019-11-09 DIAGNOSIS — D62 Acute posthemorrhagic anemia: Secondary | ICD-10-CM | POA: Diagnosis not present

## 2019-11-09 DIAGNOSIS — I350 Nonrheumatic aortic (valve) stenosis: Secondary | ICD-10-CM

## 2019-11-09 DIAGNOSIS — Z952 Presence of prosthetic heart valve: Secondary | ICD-10-CM

## 2019-11-09 DIAGNOSIS — I25118 Atherosclerotic heart disease of native coronary artery with other forms of angina pectoris: Secondary | ICD-10-CM

## 2019-11-09 DIAGNOSIS — E785 Hyperlipidemia, unspecified: Secondary | ICD-10-CM | POA: Diagnosis not present

## 2019-11-09 DIAGNOSIS — Z951 Presence of aortocoronary bypass graft: Secondary | ICD-10-CM

## 2019-11-09 LAB — IBC + FERRITIN
Ferritin: 117.5 ng/mL (ref 22.0–322.0)
Iron: 68 ug/dL (ref 42–165)
Saturation Ratios: 19.2 % — ABNORMAL LOW (ref 20.0–50.0)
Transferrin: 253 mg/dL (ref 212.0–360.0)

## 2019-11-09 LAB — VITAMIN B12: Vitamin B-12: 366 pg/mL (ref 211–911)

## 2019-11-09 NOTE — Patient Instructions (Signed)
Medication Instructions:  No changes  If you need a refill on your cardiac medications before your next appointment, please call your pharmacy.    Lab work: No new labs needed   If you have labs (blood work) drawn today and your tests are completely normal, you will receive your results only by: . MyChart Message (if you have MyChart) OR . A paper copy in the mail If you have any lab test that is abnormal or we need to change your treatment, we will call you to review the results.   Testing/Procedures: No new testing needed   Follow-Up: At [facility], you and your health needs are our priority.  As part of our continuing mission to provide you with exceptional heart care, we have created designated Provider Care Teams.  These Care Teams include your primary Cardiologist (physician) and Advanced Practice Providers (APPs -  Physician Assistants and Nurse Practitioners) who all work together to provide you with the care you need, when you need it.  . You will need a follow up appointment in 6 months  . Providers on your designated Care Team:   . Christopher Berge, NP . Ryan Dunn, PA-C . Jacquelyn Visser, PA-C  Any Other Special Instructions Will Be Listed Below (If Applicable).  COVID-19 Vaccine Information can be found at: [email or web address] For questions related to vaccine distribution or appointments, please email [email or web address] or call [phone].     

## 2019-11-09 NOTE — Progress Notes (Signed)
This patient called late today stating that he has a sternal wire protruding from his sternal incision. He went to his Cardiologist, Dr. Rockey Situ today who thought it was a suture, and was going to clip it, but upon further evaluation he said it was a wire. Patient advised to come to office for evaluation Friday 11/19/19 with chest xray.  He acknowledged receipt.

## 2019-11-10 LAB — LIPID PANEL WITH LDL/HDL RATIO
Cholesterol, Total: 90 mg/dL — ABNORMAL LOW (ref 100–199)
HDL: 47 mg/dL (ref >39)
LDL Chol Calc (NIH): 31 mg/dL (ref 0–99)
LDL/HDL Ratio: 0.7 ratio (ref 0.0–3.6)
Triglycerides: 46 mg/dL (ref 0–149)
VLDL Cholesterol Cal: 12 mg/dL (ref 5–40)

## 2019-11-11 LAB — FOLATE RBC: RBC Folate: 725 ng/mL RBC (ref >280)

## 2019-11-11 LAB — ERYTHROPOIETIN: Erythropoietin: 7 m[IU]/mL (ref 2.6–18.5)

## 2019-11-12 ENCOUNTER — Other Ambulatory Visit (INDEPENDENT_AMBULATORY_CARE_PROVIDER_SITE_OTHER)

## 2019-11-12 ENCOUNTER — Other Ambulatory Visit

## 2019-11-12 DIAGNOSIS — D62 Acute posthemorrhagic anemia: Secondary | ICD-10-CM | POA: Diagnosis not present

## 2019-11-12 LAB — FECAL OCCULT BLOOD, IMMUNOCHEMICAL: Fecal Occult Bld: NEGATIVE

## 2019-11-13 NOTE — Progress Notes (Signed)
Your fecal occult blood test was normal (there was no evidence of blood in your stool).  Your iron, A91 and folic acid levels were normal as well.  You do not need any medication changes at this time.  We will repeat your hemoglobin in December

## 2019-11-16 ENCOUNTER — Other Ambulatory Visit: Payer: Self-pay

## 2019-11-16 ENCOUNTER — Ambulatory Visit
Admission: RE | Admit: 2019-11-16 | Discharge: 2019-11-16 | Disposition: A | Discharge status: Home / Self Care | Source: Ambulatory Visit | Attending: Cardiothoracic Surgery | Admitting: Cardiothoracic Surgery

## 2019-11-16 ENCOUNTER — Ambulatory Visit (INDEPENDENT_AMBULATORY_CARE_PROVIDER_SITE_OTHER): Payer: Self-pay | Admitting: Cardiothoracic Surgery

## 2019-11-16 VITALS — BP 157/76 | HR 70 | Resp 20 | Ht 63.0 in | Wt 139.0 lb

## 2019-11-16 DIAGNOSIS — Z951 Presence of aortocoronary bypass graft: Secondary | ICD-10-CM

## 2019-11-16 NOTE — Progress Notes (Signed)
The patient is status post aVR CABG on 09/14/2019.  He was following up this week with cardiology and reported a protrusion of a wire or suture in the midportion of the sternum.  He is referred back to thoracic surgery over concerns of a sternal wire protrusion; he denies sternal instability fevers or chills.  Physical exam: Well-appearing man in no acute distress There is a foreign body protruding from the midportion of the incision that is clearly suture material.  This is cleansed and clipped at the level of the skin  Imaging: PA and lateral chest x-ray shows all sternal wires ends are pointed towards the sternum and not protruding toward the skin  Assessment: Continue to observe sternal wound  Plan: Follow-up as needed  Dean Obrien. [name], [name]

## 2019-11-19 ENCOUNTER — Ambulatory Visit: Admitting: Cardiothoracic Surgery

## 2019-11-23 ENCOUNTER — Other Ambulatory Visit: Payer: Self-pay | Admitting: Oncology

## 2019-11-23 DIAGNOSIS — R918 Other nonspecific abnormal finding of lung field: Secondary | ICD-10-CM

## 2019-11-23 NOTE — Progress Notes (Signed)
  Pulmonary Nodule Clinic Telephone Note [place]   Received referral from Dr. Derrel Nip.   HPI: Mr. Dean Obrien is a 59 year old male with past medical history significant for hypertension, CAD, nicotine dependence, prediabetes, hyperlipidemia and aortic valve replacement found to have multiple pulmonary nodules on CT angio chest aorta for follow-up of his aortic valve stenosis.  It showed several pulmonary nodules ranging from 3 mm to 7 mm in diameter.  Review and Recommendations: I personally reviewed all patient's previous imaging including most recent CTA on 09/12/2019.  I recommend follow-up with noncontrast CT scan of the chest in approximately 6 to 8 months from previous.  Social History:   Tobacco Use: Medium Risk  . Smoking Tobacco Use: Former Smoker  . Smokeless Tobacco Use: Never Used    High risk factors include: History of heavy smoking, exposure to asbestos, radium or uranium, personal family history of lung cancer, older age, sex (females greater than males), race (black and native [place] greater than weight), marginal speculation, upper lobe location, multiplicity (less than 5 nodules increases risk for malignancy) and emphysema and/or pulmonary fibrosis.   This recommendation follows the consensus statement: Guidelines for Management of Incidental Pulmonary Nodules Detected on CT Images: From the Fleischner Society 2017; Radiology 2017; 284:228-243.    I have placed order for CT scan without contrast to be completed approximately 6-8 months from previous.  Disposition: Order placed for repeat CT chest. Will notify [place] in scheduling. [place] to call patient with appointment date and time. Return to pulmonary nodule clinic a few days after his repeat imaging to discuss results and plan moving forward.  Faythe Casa, NP 11/23/2019 3:13 PM

## 2019-11-24 ENCOUNTER — Other Ambulatory Visit: Payer: Self-pay

## 2019-11-24 ENCOUNTER — Ambulatory Visit (INDEPENDENT_AMBULATORY_CARE_PROVIDER_SITE_OTHER): Admitting: Pulmonary Disease

## 2019-11-24 ENCOUNTER — Encounter: Payer: Self-pay | Admitting: Pulmonary Disease

## 2019-11-24 ENCOUNTER — Institutional Professional Consult (permissible substitution): Admitting: Pulmonary Disease

## 2019-11-24 VITALS — BP 136/76 | HR 85 | Temp 97.3°F | Ht 63.0 in | Wt 138.6 lb

## 2019-11-24 DIAGNOSIS — R918 Other nonspecific abnormal finding of lung field: Secondary | ICD-10-CM | POA: Diagnosis not present

## 2019-11-24 DIAGNOSIS — J449 Chronic obstructive pulmonary disease, unspecified: Secondary | ICD-10-CM | POA: Diagnosis not present

## 2019-11-24 DIAGNOSIS — Z87891 Personal history of nicotine dependence: Secondary | ICD-10-CM

## 2019-11-24 NOTE — Patient Instructions (Signed)
We will see you in follow-up in 3 months time call sooner should any new problems arise.  We are going to schedule breathing tests.  We will follow-up on the CT that will be done through the lung nodule clinic.

## 2019-11-24 NOTE — Progress Notes (Signed)
Subjective:    Patient ID: Dean Obrien, male    DOB: Nov 05, 1960, 59 y.o.   MRN: [id number] Requesting MD/Service: Deborra Medina, MD Date of initial consultation:11/24/19 Reason for consultation: Multiple lung nodules noted on CT chest 0 820 02/2019   PT PROFILE: 59 year old recent former smoker with incidental finding of lung nodules 09/12/2019, at the time of evaluation for AVR and one-vessel CABG.  He underwent AVR and one-vessel CABG at [facility] on 09/14/2019.   DATA: 09/12/2019 CT angio chest: No PE.  COPD changes.  Nodule in the left apex 8 x 6 x 6 mm, mean diameter 7 mm.  Small nodule at the left base measuring 3 mm.  Nodule at the anterior right lung measuring 4.5 mm, nodule on the posterior right lower lobe too small to characterize well.  Calcified nodule in the medial right upper lobe, likely old granuloma.   INTERVAL: First visit here.  HPI Is a 59 year old recent former smoker (quit 08/2019, 60 pack years) who presents for evaluation of multiple lung nodules incidentally found on CT scan of September 12, 2019.  At that time he was being evaluated for AV replacement due to critical aortic stenosis.  Went aortic valve replacement and coronary artery bypass grafting x2 on September 14, 2019.  He has been asymptomatic with regards to these multiple nodules.  Measurements and location as noted above.  Patient has not had any cough, fevers, chills or sweats since his most recent hospitalization in August.  He has not had any chest pain, orthopnea or paroxysmal nocturnal dyspnea.  Prior to his hospitalization he was having issues with dyspnea and dizziness which ultimately led to syncope and work-up as noted above.  He has not had any dyspnea since postop.  CT scan of the chest also showed some mild emphysema.  He had simple spirometry on September 13, 2019 which was essentially normal with perhaps minimal obstructive airways disease.  No lung volumes or diffusion capacity.  Patient has been  enrolled in the lung nodule clinic.  Follow-up chest CT has already been ordered.   Review of Systems A 10 point review of systems was performed and it is as noted above otherwise negative.  Past Medical History:  Diagnosis Date   Aortic stenosis, severe 09/09/2018   RHC 09/10/19 Heavily calcified AV. VTI 0.42 cm. AV mean gradient 84.7 mmHg. Vmax measures 5.51 m/s. nl filling pressures, nl pulmonary pressure, nl CO.   CAD (coronary artery disease)    LHC 09/10/2019: pRCA 90%s, pLCx 20%s, mLAD 50%s   Critical aortic valve stenosis 04/16/7122   Diastolic dysfunction    5/80/9983   Hyperlipidemia LDL goal <70    8/19 LDL 87   Hypertension    NSTEMI (non-ST elevated myocardial infarction) ([place]) 09/09/2019   Prediabetes    Patient Active Problem List   Diagnosis Date Noted   Type A blood, Rh positive 10/30/2019   Multiple pulmonary nodules determined by computed tomography of lung 10/09/2019   Acute blood loss as cause of postoperative anemia 10/09/2019   S/P CABG x 2 09/14/2019   S/P aortic valve replacement with bioprosthetic valve 09/14/2019   Coronary artery disease 09/14/2019   CAD (coronary artery disease) 09/10/2019   Prediabetes 09/10/2019   Hyperlipidemia LDL goal <70 09/10/2019   History of syncope 09/09/2019   Nicotine dependence 09/09/2019   HYPERTENSION, BENIGN 11/17/2009   Past Surgical History:  Procedure Laterality Date   AORTIC VALVE REPLACEMENT N/A 09/14/2019   Procedure: AORTIC VALVE REPLACEMENT (AVR)  USING INSPIRIS VALVE SIZE 25MM;  Surgeon: Wonda Olds, MD;  Location: [place];  Service: Open Heart Surgery;  Laterality: N/A;   CORONARY ARTERY BYPASS GRAFT N/A 09/14/2019   Procedure: CORONARY ARTERY BYPASS GRAFTING (CABG) x Two using bilateral Internal mammary arteries;  Surgeon: Wonda Olds, MD;  [facility];  Service: Open Heart Surgery;  Laterality: N/A;   RIGHT/LEFT HEART CATH AND CORONARY ANGIOGRAPHY N/A 09/10/2019    Procedure: RIGHT/LEFT HEART CATH AND CORONARY ANGIOGRAPHY;  Surgeon: Wellington Hampshire, MD;  Location: [place] [facility];  Service: Cardiovascular;  Laterality: N/A;   TEE WITHOUT CARDIOVERSION N/A 09/14/2019   Procedure: TRANSESOPHAGEAL ECHOCARDIOGRAM (TEE);  Surgeon: Wonda Olds, MD;  Location: [place];  Service: Open Heart Surgery;  Laterality: N/A;   Family History  Problem Relation Age of Onset   Emphysema Mother    Liver disease Mother    Lung cancer Father    Hypertension Sister    Hypertension Brother    Social History   Tobacco Use   Smoking status: Former Smoker    Packs/day: 1.50    Years: 40.00    Pack years: 60.00    Quit date: 09/08/2019    Years since quitting: 0.2   Smokeless tobacco: Never Used  Substance Use Topics   Alcohol use: No   Allergies  Allergen Reactions   Benadryl [Diphenhydramine]     Racing heart, syncope   Current Meds  Medication Sig   aspirin EC 81 MG EC tablet Take 1 tablet (81 mg total) by mouth daily. Swallow whole.   atorvastatin (LIPITOR) 40 MG tablet Take 1 tablet (40 mg total) by mouth daily.   clopidogrel (PLAVIX) 75 MG tablet Take 1 tablet (75 mg total) by mouth daily.   fluticasone (FLONASE) 50 MCG/ACT nasal spray Place 2 sprays into both nostrils daily.   metoprolol tartrate (LOPRESSOR) 25 MG tablet Take 1 tablet (25 mg total) by mouth 2 (two) times daily.   nicotine (NICODERM CQ - DOSED IN MG/24 HR) 7 mg/24hr patch Place 1 patch (7 mg total) onto the skin daily.   traMADol (ULTRAM) 50 MG tablet Take 1 tablet (50 mg total) by mouth every 6 (six) hours as needed for moderate pain or severe pain.   traZODone (DESYREL) 50 MG tablet Take 0.5-1 tablets (25-50 mg total) by mouth at bedtime as needed for sleep.   Immunization History  Administered Date(s) Administered   Influenza-Unspecified 11/03/2019   PFIZER SARS-COV-2 Vaccination 04/16/2019, 05/14/2019       Objective:   Physical Exam BP 136/76 (BP  Location: Left Arm, Cuff Size: Normal)    Pulse 85    Temp (!) 97.3 F (36.3 C) (Temporal)    Ht 5\' 3"  (1.6 m)    Wt 138 lb 9.6 oz (62.9 kg)    SpO2 98%    BMI 24.55 kg/m  GENERAL: Well-developed, well-nourished, no acute distress. HEAD: Normocephalic, atraumatic.  EYES: Pupils equal, round, reactive to light.  No scleral icterus.  MOUTH: Nose/mouth/throat not examined due to masking requirements for COVID 19. NECK: Supple. No thyromegaly. Trachea midline. No JVD.  No adenopathy. PULMONARY: Good air entry bilaterally.  No adventitious sounds. CARDIOVASCULAR: S1 and S2. Regular rate and rhythm.  No overt murmur. ABDOMEN: Benign. MUSCULOSKELETAL: No joint deformity, no clubbing, no edema.  NEUROLOGIC: No focal deficit, no gait disturbance, speech is fluent. SKIN: Intact,warm,dry. PSYCH: Mood and behavior normal.  Representative CT slice showing left upper lobe nodule:  Assessment & Plan:     ICD-10-CM   1. Multiple pulmonary nodules determined by computed tomography of lung  R91.8    Continue to follow radiographically He has been enrolled in the lung nodule clinic Follow-up CT has already been ordered We will follow   2. COPD suggested by initial evaluation [facility])  J44.9 Pulmonary Function Test [facility] Only   We will proceed with formal PFTs  3. Former smoker  Z87.891    Commended on continued abstinence from tobacco use   Orders Placed This Encounter  Procedures   Pulmonary Function Test [facility] Only    Standing Status:   Future    Standing Expiration Date:   11/23/2020    Scheduling Instructions:     Next available.    Order Specific Question:   Full PFT: includes the following: basic spirometry, spirometry pre & post bronchodilator, diffusion capacity (DLCO), lung volumes    Answer:   Full PFT   Discussion:  Patient is a former heavy smoker with incidental finding of lung nodules as above.  He has already been scheduled for follow-up CT.  We will review this when  available.  The most concerning nodule is the one in the left upper lobe.  If the nodule shows any change such as increased size on follow-up film will consider navigational bronchoscopy at that point.  Renold Don, MD [facility]   *This note was dictated using voice recognition software/Dragon.  Despite best efforts to proofread, errors can occur which can change the meaning.  Any change was purely unintentional.

## 2019-12-13 ENCOUNTER — Other Ambulatory Visit: Payer: Self-pay | Admitting: Oncology

## 2019-12-20 ENCOUNTER — Encounter: Payer: Self-pay | Admitting: Family

## 2019-12-31 ENCOUNTER — Ambulatory Visit: Admitting: Internal Medicine

## 2019-12-31 ENCOUNTER — Encounter: Payer: Self-pay | Admitting: Internal Medicine

## 2019-12-31 ENCOUNTER — Other Ambulatory Visit: Payer: Self-pay

## 2019-12-31 VITALS — BP 128/74 | HR 66 | Temp 98.5°F | Ht 63.0 in | Wt 144.8 lb

## 2019-12-31 DIAGNOSIS — M5432 Sciatica, left side: Secondary | ICD-10-CM

## 2019-12-31 DIAGNOSIS — I10 Essential (primary) hypertension: Secondary | ICD-10-CM | POA: Diagnosis not present

## 2019-12-31 DIAGNOSIS — D62 Acute posthemorrhagic anemia: Secondary | ICD-10-CM | POA: Diagnosis not present

## 2019-12-31 LAB — MICROALBUMIN / CREATININE URINE RATIO
Creatinine,U: 28.7 mg/dL
Microalb Creat Ratio: 2.4 mg/g (ref 0.0–30.0)
Microalb, Ur: <0.7 mg/dL (ref 0.0–1.9)

## 2019-12-31 LAB — CBC WITH DIFFERENTIAL/PLATELET
Basophils Absolute: 0.1 10*3/uL (ref 0.0–0.1)
Basophils Relative: 0.7 % (ref 0.0–3.0)
Eosinophils Absolute: 0.1 10*3/uL (ref 0.0–0.7)
Eosinophils Relative: 1.5 % (ref 0.0–5.0)
HCT: 41.8 % (ref 39.0–52.0)
Hemoglobin: 13.9 g/dL (ref 13.0–17.0)
Lymphocytes Relative: 17.8 % (ref 12.0–46.0)
Lymphs Abs: 1.7 10*3/uL (ref 0.7–4.0)
MCHC: 33.3 g/dL (ref 30.0–36.0)
MCV: 88.3 fl (ref 78.0–100.0)
Monocytes Absolute: 1.1 10*3/uL — ABNORMAL HIGH (ref 0.1–1.0)
Monocytes Relative: 11.6 % (ref 3.0–12.0)
Neutro Abs: 6.6 10*3/uL (ref 1.4–7.7)
Neutrophils Relative %: 68.4 % (ref 43.0–77.0)
Platelets: 193 10*3/uL (ref 150.0–400.0)
RBC: 4.73 Mil/uL (ref 4.22–5.81)
RDW: 16.6 % — ABNORMAL HIGH (ref 11.5–15.5)
WBC: 9.6 10*3/uL (ref 4.0–10.5)

## 2019-12-31 LAB — BASIC METABOLIC PANEL
BUN: 14 mg/dL (ref 6–23)
CO2: 27 mEq/L (ref 19–32)
Calcium: 9.4 mg/dL (ref 8.4–10.5)
Chloride: 100 mEq/L (ref 96–112)
Creatinine, Ser: 0.64 mg/dL (ref 0.40–1.50)
GFR: 104.42 mL/min (ref >60.00)
Glucose, Bld: 111 mg/dL — ABNORMAL HIGH (ref 70–99)
Potassium: 4.4 mEq/L (ref 3.5–5.1)
Sodium: 135 mEq/L (ref 135–145)

## 2019-12-31 MED ORDER — GABAPENTIN 100 MG PO CAPS: 100.0000 mg | ORAL_CAPSULE | Freq: Three times a day (TID) | ORAL | 3 refills | Status: DC

## 2019-12-31 MED ORDER — PREDNISONE 10 MG PO TABS: ORAL_TABLET | ORAL | 0 refills | Status: DC

## 2019-12-31 MED ORDER — TELMISARTAN 20 MG PO TABS: 20.0000 mg | ORAL_TABLET | Freq: Every day | ORAL | 1 refills | Status: DC

## 2019-12-31 MED ORDER — ZOSTER VAC RECOMB ADJUVANTED 50 MCG/0.5ML IM SUSR: 0.5000 mL | Freq: Once | INTRAMUSCULAR | 1 refills | Status: AC

## 2019-12-31 NOTE — Progress Notes (Signed)
Subjective:  Patient ID: Dean Obrien, male    DOB: 1960/02/21  Age: 59 y.o. MRN: [id number]  CC: The primary encounter diagnosis was HYPERTENSION, BENIGN. Diagnoses of Acute blood loss as cause of postoperative anemia and Sciatica, left side were also pertinent to this visit.  HPI Dean Obrien presents for follow up on multiple issues   This visit occurred during the SARS-CoV-2 public health emergency.  Safety protocols were in place, including screening questions prior to the visit, additional usage of staff PPE, and extensive cleaning of exam room while observing appropriate contact time as indicated for disinfecting solutions.   HTN:  Home readings 140/70 at home, but has been  in pain from sciatica.  Taking metoprolol   Sciatica: chronic   Intermittent episodes that typically last 6 to 8 weeks.  Aggravated by lifting and pulling wife who is bed bound and full assist .  Remote history of MRI  im 2015 which noted noted arthritis and herniated disk , L4 nerve root compromise on the left referred to a back specialist,  Received an ESI , which increased pain in back for several days so he never went back  Saw a chiropractor instead and after 2 months pain was completely gone. Pain when present radiates to left calf.   Denies foot drop  Outpatient Medications Prior to Visit  Medication Sig Dispense Refill  . aspirin EC 81 MG EC tablet Take 1 tablet (81 mg total) by mouth daily. Swallow whole. 30 tablet 11  . atorvastatin (LIPITOR) 40 MG tablet Take 1 tablet (40 mg total) by mouth daily. 90 tablet 1  . clopidogrel (PLAVIX) 75 MG tablet Take 1 tablet (75 mg total) by mouth daily. 90 tablet 1  . fluticasone (FLONASE) 50 MCG/ACT nasal spray Place 2 sprays into both nostrils daily. 16 g 6  . metoprolol tartrate (LOPRESSOR) 25 MG tablet Take 1 tablet (25 mg total) by mouth 2 (two) times daily. 180 tablet 1  . traMADol (ULTRAM) 50 MG tablet Take 1 tablet (50 mg total) by mouth every 6 (six)  hours as needed for moderate pain or severe pain. 28 tablet 0  . traZODone (DESYREL) 50 MG tablet Take 0.5-1 tablets (25-50 mg total) by mouth at bedtime as needed for sleep. 90 tablet 3  . nicotine (NICODERM CQ - DOSED IN MG/24 HR) 7 mg/24hr patch Place 1 patch (7 mg total) onto the skin daily. 28 patch 0   No facility-administered medications prior to visit.    Review of Systems;  Patient denies headache, fevers, malaise, unintentional weight loss, skin rash, eye pain, sinus congestion and sinus pain, sore throat, dysphagia,  hemoptysis , cough, dyspnea, wheezing, chest pain, palpitations, orthopnea, edema, abdominal pain, nausea, melena, diarrhea, constipation, flank pain, dysuria, hematuria, urinary  Frequency, nocturia, numbness, tingling, seizures,  Focal weakness, Loss of consciousness,  Tremor, insomnia, depression, anxiety, and suicidal ideation.      Objective:  BP 128/74   Pulse 66   Temp 98.5 F (36.9 C)   Ht 5\' 3"  (1.6 m)   Wt 144 lb 12.8 oz (65.7 kg)   SpO2 98%   BMI 25.65 kg/m   BP Readings from Last 3 Encounters:  12/31/19 128/74  11/24/19 136/76  11/16/19 (!) 157/76    Wt Readings from Last 3 Encounters:  12/31/19 144 lb 12.8 oz (65.7 kg)  11/24/19 138 lb 9.6 oz (62.9 kg)  11/16/19 139 lb (63 kg)    General appearance: alert, cooperative and appears stated age  Ears: normal TM's and external ear canals both ears Throat: lips, mucosa, and tongue normal; teeth and gums normal Neck: no adenopathy, no carotid bruit, supple, symmetrical, trachea midline and thyroid not enlarged, symmetric, no tenderness/mass/nodules Back: symmetric, no curvature. ROM normal. No CVA tenderness. Lungs: clear to auscultation bilaterally Heart: regular rate and rhythm, S1, S2 normal, no murmur, click, rub or gallop Abdomen: soft, non-tender; bowel sounds normal; no masses,  no organomegaly Pulses: 2+ and symmetric Skin: Skin color, texture, turgor normal. No rashes or  lesions Lymph nodes: Cervical, supraclavicular, and axillary nodes normal. Neuro: CNs 2-12 intact. DTRs 2+/4 in biceps, brachioradialis, patellars and achilles. Muscle strength 5/5 in upper and lower exremities. Fine resting tremor bilaterally both hands cerebellar function normal. Romberg negative.  No pronator drift.   Gait normal.    Lab Results  Component Value Date   HGBA1C 5.8 (H) 09/13/2019   HGBA1C 5.8 (H) 09/09/2019    Lab Results  Component Value Date   CREATININE 0.70 10/22/2019   CREATININE 0.63 (L) 10/01/2019   CREATININE 0.61 09/19/2019    Lab Results  Component Value Date   WBC 8.1 10/22/2019   HGB 11.8 (L) 10/22/2019   HCT 35.1 (L) 10/22/2019   PLT 255.0 10/22/2019   GLUCOSE 144 (H) 10/22/2019   CHOL 90 (L) 11/09/2019   TRIG 46 11/09/2019   HDL 47 11/09/2019   LDLCALC 31 11/09/2019   ALT 13 10/22/2019   AST 13 10/22/2019   NA 134 (L) 10/22/2019   K 4.5 10/22/2019   CL 101 10/22/2019   CREATININE 0.70 10/22/2019   BUN 16 10/22/2019   CO2 25 10/22/2019   TSH 1.224 09/09/2019   INR 1.7 (H) 09/14/2019   HGBA1C 5.8 (H) 09/13/2019    DG Chest 2 View  Result Date: 11/16/2019 CLINICAL DATA:  Patient status post CABG and aortic valve replacement 09/14/2019. Possible protruding wire. EXAM: CHEST - 2 VIEW COMPARISON:  PA and lateral chest 10/04/2019. FINDINGS: Seven median sternotomy wires are intact and unchanged in position. Prosthetic aortic valve is noted. Heart size is normal. Lungs are clear. No pneumothorax or pleural effusion. No acute or focal bony abnormality. IMPRESSION: No change in the position of the patient's median sternotomy wires or aortic valve. No acute disease. Electronically Signed   By: Inge Rise M.D.   On: 11/16/2019 14:57    Assessment & Plan:   Problem List Items Addressed This Visit      Unprioritized   Sciatica, left side    MRI from 2015 reviewed.  He had L5 nerve root compromise.  Currently his symtpoms are mild.  Given  back extension exercises,  And rx for prednisone taper/gabapentin for next episode       Relevant Medications   gabapentin (NEURONTIN) 100 MG capsule   HYPERTENSION, BENIGN - Primary    Discussed adding ARB now that home BP readings have been 140/80  In the setting of acute pain from sciatica.  Advised to repeat readings and if 120/70 or better will start       Relevant Medications   telmisartan (MICARDIS) 20 MG tablet   Other Relevant Orders   Basic metabolic panel   Microalbumin / creatinine urine ratio   Acute blood loss as cause of postoperative anemia    hgb has been improving and iron as well. Repeat today  Lab Results  Component Value Date   WBC 8.1 10/22/2019   HGB 11.8 (L) 10/22/2019   HCT 35.1 (L) 10/22/2019  MCV 88.7 10/22/2019   PLT 255.0 10/22/2019   Lab Results  Component Value Date   IRON 68 11/09/2019   FERRITIN 117.5 11/09/2019         Relevant Orders   CBC with Differential/Platelet   Iron, TIBC and Ferritin Panel      I have discontinued Terrence Dupont "Tom"'s nicotine. I am also having him start on predniSONE, gabapentin, Zoster Vaccine Adjuvanted, and telmisartan. Additionally, I am having him maintain his aspirin, traMADol, atorvastatin, clopidogrel, metoprolol tartrate, traZODone, and fluticasone.  Meds ordered this encounter  Medications  . predniSONE (DELTASONE) 10 MG tablet    Sig: 6 tablets daily for 3 days, then reduce by 1 tablet daily until gone    Dispense:  33 tablet    Refill:  0  . gabapentin (NEURONTIN) 100 MG capsule    Sig: Take 1 capsule (100 mg total) by mouth 3 (three) times daily.    Dispense:  90 capsule    Refill:  3  . Zoster Vaccine Adjuvanted [facility]) injection    Sig: Inject 0.5 mLs into the muscle once for 1 dose.    Dispense:  1 each    Refill:  1  . telmisartan (MICARDIS) 20 MG tablet    Sig: Take 1 tablet (20 mg total) by mouth at bedtime.    Dispense:  90 tablet    Refill:  1    Medications  Discontinued During This Encounter  Medication Reason  . nicotine (NICODERM CQ - DOSED IN MG/24 HR) 7 mg/24hr patch Completed Course    Follow-up: Return in about 6 months (around 06/30/2020).   Crecencio Mc, MD

## 2019-12-31 NOTE — Assessment & Plan Note (Signed)
MRI from 2015 reviewed.  He had L5 nerve root compromise.  Currently his symtpoms are mild.  Given back extension exercises,  And rx for prednisone taper/gabapentin for next episode

## 2019-12-31 NOTE — Assessment & Plan Note (Signed)
Discussed adding ARB now that home BP readings have been 140/80  In the setting of acute pain from sciatica.  Advised to repeat readings and if 120/70 or better will start

## 2019-12-31 NOTE — Patient Instructions (Addendum)
For your sciatica:   You can take  up to 4000 mg of acetominophen (tylenol) every day safely  In divided doses (1000 mg every 6 hours  Or 2000 mg every 12 hours.)  For one week   After one week,  YOU MUST REDUCE tylenol dose to 2000 mg daily  Prednisone can be saved for the next major flare,  And you can add the tylenol   Gabapentin and the tramadol for control of pain  But not aleve or motrin while on the prednisone   Exercise: (supported back extension, standing)  Stand against a counter or sofa with your buttocks resting on the edge and your hands on the edge as well on either side of your back  Slowly lean backwards,  Bending from the waist, until you feel slight discomfort. Restore yourself to vertical position (up straight) Repeat the back extension 10 times , each time extending a little farther).  What should you expect? The pain should recede from the calf/thigh/buttocks but may localize to the lower back  If it does not,  Or if it makes the leg pain worse, STOP doing it.   If it results in improvement,  Repeat the exercise every 2-3 hours while awake and STOP THE OTHER EXERCISES that do the opposite motion (back flexion).   For your blood pressure and heart:  We discussed adding telmisartan at bedtime if your readings are >120/70 and/ir you have protein  in your urine today..  You will need to return for additional blood test 7-10 days after starting this medication

## 2019-12-31 NOTE — Assessment & Plan Note (Signed)
hgb has been improving and iron as well. Repeat today  Lab Results  Component Value Date   WBC 8.1 10/22/2019   HGB 11.8 (L) 10/22/2019   HCT 35.1 (L) 10/22/2019   MCV 88.7 10/22/2019   PLT 255.0 10/22/2019   Lab Results  Component Value Date   IRON 68 11/09/2019   FERRITIN 117.5 11/09/2019

## 2020-01-01 LAB — IRON,TIBC AND FERRITIN PANEL
%SAT: 27 % (calc) (ref 20–48)
Ferritin: 68 ng/mL (ref 38–380)
Iron: 110 ug/dL (ref 50–180)
TIBC: 407 mcg/dL (calc) (ref 250–425)

## 2020-01-02 NOTE — Progress Notes (Signed)
  Your previously noted anemia has resolved, and your iron  levels and other labs are normal.

## 2020-01-06 ENCOUNTER — Other Ambulatory Visit: Payer: Self-pay | Admitting: Family

## 2020-01-06 DIAGNOSIS — E785 Hyperlipidemia, unspecified: Secondary | ICD-10-CM

## 2020-01-07 ENCOUNTER — Encounter: Payer: Self-pay | Admitting: *Deleted

## 2020-01-11 ENCOUNTER — Other Ambulatory Visit: Payer: Self-pay | Admitting: Family

## 2020-01-11 DIAGNOSIS — I25118 Atherosclerotic heart disease of native coronary artery with other forms of angina pectoris: Secondary | ICD-10-CM

## 2020-01-14 ENCOUNTER — Other Ambulatory Visit: Payer: Self-pay | Admitting: Family

## 2020-01-14 DIAGNOSIS — I25118 Atherosclerotic heart disease of native coronary artery with other forms of angina pectoris: Secondary | ICD-10-CM

## 2020-02-11 ENCOUNTER — Telehealth: Payer: Self-pay | Admitting: Pulmonary Disease

## 2020-02-11 NOTE — Telephone Encounter (Signed)
Pt returning missed call. Informed of covid test and pft date/time. Nothing further needed.

## 2020-02-11 NOTE — Telephone Encounter (Signed)
Lm to relay date/time of covid test prior to PFT.  [facility]

## 2020-02-15 ENCOUNTER — Ambulatory Visit

## 2020-02-15 ENCOUNTER — Other Ambulatory Visit: Payer: Self-pay

## 2020-02-15 ENCOUNTER — Other Ambulatory Visit
Admission: RE | Admit: 2020-02-15 | Discharge: 2020-02-15 | Disposition: A | Discharge status: Home / Self Care | Source: Ambulatory Visit | Attending: Pulmonary Disease | Admitting: Pulmonary Disease

## 2020-02-15 DIAGNOSIS — Z20822 Contact with and (suspected) exposure to covid-19: Secondary | ICD-10-CM | POA: Diagnosis not present

## 2020-02-15 DIAGNOSIS — Z01812 Encounter for preprocedural laboratory examination: Secondary | ICD-10-CM | POA: Diagnosis present

## 2020-02-15 LAB — SARS CORONAVIRUS 2 (TAT 6-24 HRS): SARS Coronavirus 2: NEGATIVE

## 2020-02-16 ENCOUNTER — Ambulatory Visit: Attending: Pulmonary Disease

## 2020-02-16 DIAGNOSIS — J449 Chronic obstructive pulmonary disease, unspecified: Secondary | ICD-10-CM | POA: Insufficient documentation

## 2020-02-16 DIAGNOSIS — Z87891 Personal history of nicotine dependence: Secondary | ICD-10-CM | POA: Diagnosis not present

## 2020-02-16 MED ORDER — ALBUTEROL SULFATE (2.5 MG/3ML) 0.083% IN NEBU
2.5000 mg | INHALATION_SOLUTION | Freq: Once | RESPIRATORY_TRACT | Status: AC
  Administered 2020-02-16: 2.5 mg via RESPIRATORY_TRACT
  Filled 2020-02-16: qty 3

## 2020-02-21 ENCOUNTER — Telehealth: Payer: Self-pay | Admitting: Pulmonary Disease

## 2020-02-21 NOTE — Telephone Encounter (Signed)
Dean Pita, MD  Dean Obrien, CMA His breathing tests are actually not bad. He has mild restriction meaning he does not take as deep of breath but we see this Obrien lot after heart surgery. It is not severe.   Patient is aware of results.  He voiced his understanding and had no further questions.  Nothing further needed.

## 2020-02-22 LAB — PULMONARY FUNCTION TEST ARMC ONLY
DL/VA % pred: 85 %
DL/VA: 3.78 ml/min/mmHg/L
DLCO unc % pred: 77 %
DLCO unc: 17.19 ml/min/mmHg
FEF 25-75 Post: 2.82 L/sec
FEF 25-75 Pre: 2.52 L/sec
FEF2575-%Change-Post: 11 %
FEF2575-%Pred-Post: 114 %
FEF2575-%Pred-Pre: 102 %
FEV1-%Change-Post: 1 %
FEV1-%Pred-Post: 87 %
FEV1-%Pred-Pre: 86 %
FEV1-Post: 2.48 L
FEV1-Pre: 2.44 L
FEV1FVC-%Change-Post: 0 %
FEV1FVC-%Pred-Pre: 105 %
FEV6-%Change-Post: 2 %
FEV6-%Pred-Post: 87 %
FEV6-%Pred-Pre: 85 %
FEV6-Post: 3.1 L
FEV6-Pre: 3.01 L
FEV6FVC-%Pred-Post: 105 %
FEV6FVC-%Pred-Pre: 105 %
FVC-%Change-Post: 2 %
FVC-%Pred-Post: 83 %
FVC-%Pred-Pre: 81 %
FVC-Post: 3.1 L
Post FEV1/FVC ratio: 80 %
Post FEV6/FVC ratio: 100 %
Pre FEV1/FVC ratio: 80 %
Pre FEV6/FVC Ratio: 100 %
RV % pred: 69 %
RV: 1.26 L
TLC % pred: 80 %
TLC: 4.51 L

## 2020-02-24 ENCOUNTER — Encounter: Payer: Self-pay | Admitting: Pulmonary Disease

## 2020-02-24 ENCOUNTER — Other Ambulatory Visit: Payer: Self-pay

## 2020-02-24 ENCOUNTER — Ambulatory Visit (INDEPENDENT_AMBULATORY_CARE_PROVIDER_SITE_OTHER): Admitting: Pulmonary Disease

## 2020-02-24 VITALS — BP 130/78 | HR 71 | Temp 97.1°F | Ht 63.0 in | Wt 151.0 lb

## 2020-02-24 DIAGNOSIS — Z87891 Personal history of nicotine dependence: Secondary | ICD-10-CM

## 2020-02-24 DIAGNOSIS — R918 Other nonspecific abnormal finding of lung field: Secondary | ICD-10-CM | POA: Diagnosis not present

## 2020-02-24 NOTE — Progress Notes (Signed)
Subjective:    Patient ID: Dean Obrien, male    DOB: 1960-10-19, 60 y.o.   MRN: [id number]  Requesting MD/Service: Deborra Medina, MD Date of initial consultation:11/24/19 Reason for consultation: Multiple lung nodules noted on CT chest 09/12/2019   PT PROFILE: 60 year old recent former smoker with incidental finding of lung nodules 09/12/2019, at the time of evaluation for AVR and one-vessel CABG.  He underwent AVR and one-vessel CABG at [facility] on 09/14/2019.   DATA: 09/12/2019 CT angio chest: No PE.  COPD changes.  Nodule in the left apex 8 x 6 x 6 mm, mean diameter 7 mm.  Small nodule at the left base measuring 3 mm.  Nodule at the anterior right lung measuring 4.5 mm, nodule on the posterior right lower lobe too small to characterize well.  Calcified nodule in the medial right upper lobe, likely old granuloma. 02/16/2020 PFTs: FEV1 2.4 L or 86% predicted, FVC 3.03 L or 81% predicted FEV1/FVC 80%, no bronchodilator response.  Mild decrease in lung volumes, diffusion capacity normal.  Consistent with very mild restriction.     INTERVAL: No issues since initial consultation 11/24/2019   HPI 60 year old  former smoker (quit 08/2019, 60 pack years) who presents for evaluation of multiple lung nodules incidentally found on CT scan of September 12, 2019.  At that time he was being evaluated for AV replacement due to critical aortic stenosis.  He had aortic valve replacement and coronary artery bypass grafting x2 on September 14, 2019.  He has been asymptomatic with regards to these multiple nodules.  Measurements and location as noted above.  Patient has not had any cough, fevers, chills or sweats since his most recent hospitalization in August. He has not had any chest pain, orthopnea or paroxysmal nocturnal dyspnea.  Prior to his hospitalization he was having issues with dyspnea and dizziness which ultimately led to syncope and work-up as noted above.  He has not had any dyspnea since his AV  replacement and CABG.  CT scan of the chest also showed some mild emphysema.  He had simple spirometry on September 13, 2019 which was essentially normal with perhaps minimal obstructive airways disease.  No lung volumes or diffusion capacity.  He had pulmonary function test performed on 16 February 2020 that are consistent with very mild restriction this is not unusual after median sternotomy.  Otherwise no overt obstruction.  Pacitti normal.  He continues to be asymptomatic with regards to dyspnea, cough, sputum production.  No hemoptysis.  No fevers, chills or sweats.  He has a first appointment with the lung nodule clinic in April and at that time will have a repeat CT scan.  He remains abstinent of smoking   Review of Systems A 10 point review of systems was performed and it is as noted above otherwise negative.  Patient Active Problem List   Diagnosis Date Noted  . Sciatica, left side 12/31/2019  . Type A blood, Rh positive 10/30/2019  . Multiple pulmonary nodules determined by computed tomography of lung 10/09/2019  . Acute blood loss as cause of postoperative anemia 10/09/2019  . 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 S/P aortic valve replacement with bioprosthetic valve 09/14/2019  . Coronary artery disease 09/14/2019  . CAD (coronary artery disease) 09/10/2019  . Prediabetes 09/10/2019  . Hyperlipidemia LDL goal <70 09/10/2019  . History of syncope 09/09/2019  . Nicotine dependence 09/09/2019  . HYPERTENSION, BENIGN 11/17/2009   Allergies  Allergen Reactions  . Benadryl [Diphenhydramine]  Racing heart, syncope   Current Meds  Medication Sig  . aspirin EC 81 MG EC tablet Take 1 tablet (81 mg total) by mouth daily. Swallow whole.  Marland Kitchen atorvastatin (LIPITOR) 40 MG tablet TAKE 1 TABLET(40 MG) BY MOUTH DAILY  . clopidogrel (PLAVIX) 75 MG tablet TAKE 1 TABLET(75 MG) BY MOUTH DAILY  . fluticasone (FLONASE) 50 MCG/ACT nasal spray Place 2 sprays into both nostrils daily.  Marland Kitchen gabapentin  (NEURONTIN) 100 MG capsule Take 1 capsule (100 mg total) by mouth 3 (three) times daily.  . metoprolol tartrate (LOPRESSOR) 25 MG tablet TAKE 1 TABLET(25 MG) BY MOUTH TWICE DAILY  . telmisartan (MICARDIS) 20 MG tablet Take 1 tablet (20 mg total) by mouth at bedtime.  . traMADol (ULTRAM) 50 MG tablet Take 1 tablet (50 mg total) by mouth every 6 (six) hours as needed for moderate pain or severe pain.  . traZODone (DESYREL) 50 MG tablet Take 0.5-1 tablets (25-50 mg total) by mouth at bedtime as needed for sleep.   Immunization History  Administered Date(s) Administered  . Hepatitis A, Adult 12/26/1994, 12/12/1995  . Influenza Whole 01/23/1999, 12/20/1999, 12/10/2000  . Influenza-Unspecified 11/03/2019  . MMR 08/21/1980  . OPV 08/21/1980  . PFIZER(Purple Top)SARS-COV-2 Vaccination 04/16/2019, 05/14/2019, 12/25/2019  . Td 12/12/1995       Objective:   Physical Exam BP 130/78 (BP Location: Left Arm, Cuff Size: Normal)   Pulse 71   Temp (!) 97.1 F (36.2 C) (Temporal)   Ht 5' 3"  (1.6 m)   Wt 151 lb (68.5 kg)   SpO2 95%   BMI 26.75 kg/m   GENERAL: Well-developed, well-nourished, no acute distress. HEAD: Normocephalic, atraumatic.  EYES: Pupils equal, round, reactive to light.  No scleral icterus.  MOUTH: Nose/mouth/throat not examined due to masking requirements for COVID 19. NECK: Supple. No thyromegaly. Trachea midline. No JVD.  No adenopathy. PULMONARY: Good air entry bilaterally.  No adventitious sounds. CARDIOVASCULAR: S1 and S2. Regular rate and rhythm.   Valve sounds crisp, no overt murmur. ABDOMEN: Benign. MUSCULOSKELETAL: No joint deformity, no clubbing, no edema.  NEUROLOGIC: No focal deficit, no gait disturbance, speech is fluent. SKIN: Intact,warm,dry. PSYCH: Mood and behavior normal.  Pulmonary function testing was reviewed with the patient today.    Assessment & Plan:     ICD-10-CM   1. Multiple pulmonary nodules determined by computed tomography of lung  R91.8     Enrolled in lung nodule clinic Follow-up through lung nodule clinic Return here as needed if changes noted through lung nodule clinic  2. Former smoker  [id number]    Continues to be abstinent of tobacco Commended on this PFTs relatively benign   Smoking cessation instruction/counseling given:  commended patient for quitting and reviewed strategies for preventing relapses.  We will see the patient in follow-up on an as-needed basis.  We will have lung nodule clinic follow abnormalities on CT.  At any time he may return if determined by the lung nodule clinic.   Renold Don, MD [facility]   *This note was dictated using voice recognition software/Dragon.  Despite best efforts to proofread, errors can occur which can change the meaning.  Any change was purely unintentional.

## 2020-02-24 NOTE — Patient Instructions (Signed)
We will let you follow with the lung nodule clinic, if there are any changes on your CT we can see you back then at that time.  Lung tests were very good some minimal restriction that is seen after cardiac surgery. This should not impair your function greatly.  Stay safe!!!

## 2020-04-04 ENCOUNTER — Other Ambulatory Visit: Payer: Self-pay | Admitting: Family

## 2020-04-04 DIAGNOSIS — E785 Hyperlipidemia, unspecified: Secondary | ICD-10-CM

## 2020-04-05 MED ORDER — ATORVASTATIN CALCIUM 40 MG PO TABS: 40.0000 mg | ORAL_TABLET | Freq: Every day | ORAL | 1 refills | Status: DC

## 2020-04-10 ENCOUNTER — Encounter: Payer: Self-pay | Admitting: Internal Medicine

## 2020-04-10 ENCOUNTER — Telehealth (INDEPENDENT_AMBULATORY_CARE_PROVIDER_SITE_OTHER): Admitting: Internal Medicine

## 2020-04-10 VITALS — BP 134/83 | Ht 63.0 in | Wt 144.0 lb

## 2020-04-10 DIAGNOSIS — M5432 Sciatica, left side: Secondary | ICD-10-CM

## 2020-04-10 DIAGNOSIS — S3421XS Injury of nerve root of lumbar spine, sequela: Secondary | ICD-10-CM

## 2020-04-10 MED ORDER — TIZANIDINE HCL 2 MG PO CAPS: 2.0000 mg | ORAL_CAPSULE | Freq: Three times a day (TID) | ORAL | 1 refills | Status: DC | PRN

## 2020-04-10 MED ORDER — GABAPENTIN 100 MG PO CAPS: 200.0000 mg | ORAL_CAPSULE | Freq: Four times a day (QID) | ORAL | 3 refills | Status: DC

## 2020-04-10 MED ORDER — ONDANSETRON 4 MG PO TBDP: 4.0000 mg | ORAL_TABLET | Freq: Three times a day (TID) | ORAL | 0 refills | Status: DC | PRN

## 2020-04-10 MED ORDER — HYDROCODONE-ACETAMINOPHEN 10-325 MG PO TABS: 1.0000 | ORAL_TABLET | Freq: Four times a day (QID) | ORAL | 0 refills | Status: DC | PRN

## 2020-04-10 NOTE — Patient Instructions (Signed)
Vicodin 10/325 every 6 hours as needed.  Ok to use tylenol in between.  CAUSES CONSTIPATION   YOU CAN ADD Tizanidine 2 mg every 8 hours as needed for tight muscles   Slowly increase gabapentin by 100 mg until you are taking 200-300 mg every 6 to 8 hours as tolerated   MRI ORDERED STAT

## 2020-04-10 NOTE — Assessment & Plan Note (Signed)
MRI from 2015 reviewed.  He had L5 nerve root compromise.  Currently his symtpoms are severe and unrelenting despite a prolonged steroid taper.   rx hydrocodone/tizanidine/gabapentin and MRI lumbar spine in anticipation of need for neurosurgical referral

## 2020-04-10 NOTE — Progress Notes (Signed)
Virtual Visit via [employer]  This visit type was conducted due to national recommendations for restrictions regarding the COVID-19 pandemic (e.g. social distancing).  This format is felt to be most appropriate for this patient at this time.  All issues noted in this document were discussed and addressed.  No physical exam was performed (except for noted visual exam findings with Video Visits).   I connected with@ on 04/10/20 at  4:30 PM EDT by a video enabled telemedicine application  and verified that I am speaking with the correct person using two identifiers. Location patient: home Location provider: work or home office Persons participating in the virtual visit: patient, provider  I discussed the limitations, risks, security and privacy concerns of performing an evaluation and management service by telephone and the availability of in person appointments. I also discussed with the patient that there may be a patient responsible charge related to this service. The patient expressed understanding and agreed to proceed.  Reason for visit: medical management of back pain   HPI:  60 YR OLD MALE with history of intermittent sciatica, last episode several months ago, presents with low back pain that began in late February and started radiating down left leg by early march.  The pain has become quite severe and he reports missing work yesterday because he could not walk without support.  There was no incident or activity other than his normal everyday activities helping is nonambulatory wife who is wheelchair bound due to [place].  He is a Engineer, site at [employer] but denies lifting any heavy boxes.  He started the prednisone taper (60 mg qd x 3,  Taper by 10 mg daily until gone) on March 3 and finished it with no change in severity of pain.  Taking gabapentin 100 mg tid  tried resuming tramadol which caused nausea and and a pruritic rash .  No foot drop .    ROS: See pertinent positives and negatives per  HPI.  Past Medical History:  Diagnosis Date  . Aortic stenosis, severe 09/09/2018   RHC 09/10/19 Heavily calcified AV. VTI 0.42 cm. AV mean gradient 84.7 mmHg. Vmax measures 5.51 m/s. nl filling pressures, nl pulmonary pressure, nl CO.  Marland Kitchen CAD (coronary artery disease)    LHC 09/10/2019: pRCA 90%s, pLCx 20%s, mLAD 50%s  . Critical aortic valve stenosis 09/09/2019  . Diastolic dysfunction    06/18/4130  . Hyperlipidemia LDL goal <70    8/19 LDL 87  . Hypertension   . NSTEMI (non-ST elevated myocardial infarction) ([facility]) 09/09/2019  . Prediabetes     Past Surgical History:  Procedure Laterality Date  . AORTIC VALVE REPLACEMENT N/A 09/14/2019   Procedure: AORTIC VALVE REPLACEMENT (AVR) USING INSPIRIS VALVE SIZE 25MM;  Surgeon: Wonda Olds, MD;  Location: [facility];  Service: Open Heart Surgery;  Laterality: N/A;  . CORONARY ARTERY BYPASS GRAFT N/A 09/14/2019   Procedure: CORONARY ARTERY BYPASS GRAFTING (CABG) x Two using bilateral Internal mammary arteries;  Surgeon: Wonda Olds, MD;  Location: [facility];  Service: Open Heart Surgery;  Laterality: N/A;  . RIGHT/LEFT HEART CATH AND CORONARY ANGIOGRAPHY N/A 09/10/2019   Procedure: RIGHT/LEFT HEART CATH AND CORONARY ANGIOGRAPHY;  Surgeon: Wellington Hampshire, MD;  Location: [facility];  Service: Cardiovascular;  Laterality: N/A;  . TEE WITHOUT CARDIOVERSION N/A 09/14/2019   Procedure: TRANSESOPHAGEAL ECHOCARDIOGRAM (TEE);  Surgeon: Wonda Olds, MD;  Location: [facility];  Service: Open Heart Surgery;  Laterality: N/A;    Family History  Problem Relation Age of Onset  . Emphysema Mother   . Liver disease Mother   . Lung cancer Father   . Hypertension Sister   . Hypertension Brother     SOCIAL HX:  reports that he quit smoking about 7 months ago. He has a 60.00 pack-year smoking history. He has never used smokeless tobacco. He reports that he does not drink alcohol and does not use drugs.   Current Outpatient Medications:  .   aspirin EC 81 MG EC tablet, Take 1 tablet (81 mg total) by mouth daily. Swallow whole., Disp: 30 tablet, Rfl: 11 .  atorvastatin (LIPITOR) 40 MG tablet, Take 1 tablet (40 mg total) by mouth daily., Disp: 90 tablet, Rfl: 1 .  clopidogrel (PLAVIX) 75 MG tablet, TAKE 1 TABLET(75 MG) BY MOUTH DAILY, Disp: 90 tablet, Rfl: 1 .  fluticasone (FLONASE) 50 MCG/ACT nasal spray, Place 2 sprays into both nostrils daily., Disp: 16 g, Rfl: 6 .  HYDROcodone-acetaminophen (NORCO) 10-325 MG tablet, Take 1 tablet by mouth every 6 (six) hours as needed for up to 7 days., Disp: 30 tablet, Rfl: 0 .  metoprolol tartrate (LOPRESSOR) 25 MG tablet, TAKE 1 TABLET(25 MG) BY MOUTH TWICE DAILY, Disp: 180 tablet, Rfl: 1 .  ondansetron (ZOFRAN ODT) 4 MG disintegrating tablet, Take 1 tablet (4 mg total) by mouth every 8 (eight) hours as needed for nausea or vomiting., Disp: 20 tablet, Rfl: 0 .  telmisartan (MICARDIS) 20 MG tablet, Take 1 tablet (20 mg total) by mouth at bedtime., Disp: 90 tablet, Rfl: 1 .  tizanidine (ZANAFLEX) 2 MG capsule, Take 1 capsule (2 mg total) by mouth 3 (three) times daily as needed for muscle spasms., Disp: 60 capsule, Rfl: 1 .  traZODone (DESYREL) 50 MG tablet, Take 0.5-1 tablets (25-50 mg total) by mouth at bedtime as needed for sleep., Disp: 90 tablet, Rfl: 3 .  gabapentin (NEURONTIN) 100 MG capsule, Take 2 capsules (200 mg total) by mouth 4 (four) times daily. 2 capsules in the mo, Disp: 240 capsule, Rfl: 3  EXAM:  VITALS per patient if applicable:  GENERAL: alert, oriented, appears to be in pain and anxious   HEENT: atraumatic, conjunttiva clear, no obvious abnormalities on inspection of external nose and ears  NECK: normal movements of the head and neck  LUNGS: on inspection no signs of respiratory distress, breathing rate appears normal, no obvious gross SOB, gasping or wheezing  CV: no obvious cyanosis  MS: moves all visible extremities without noticeable abnormality  PSYCH/NEURO:  pleasant and cooperative, no obvious depression or anxiety, speech and thought processing grossly intact  ASSESSMENT AND PLAN:  Discussed the following assessment and plan:  Sciatica, left side - Plan: MR Lumbar Spine Wo Contrast  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  He had L5 nerve root compromise.  Currently his symtpoms are severe and unrelenting despite a prolonged steroid taper.   rx hydrocodone/tizanidine/gabapentin and MRI lumbar spine in anticipation of need for neurosurgical referral     I discussed the assessment and treatment plan with the patient. The patient was provided an opportunity to ask questions and all were answered. The patient agreed with the plan and demonstrated an understanding of the instructions.   The patient was advised to call back or seek an in-person evaluation if the symptoms worsen or if the condition fails to improve as anticipated.   I spent 30  minutes dedicated to the care of this patient on the date of this encounter to include pre-visit review of his medical history,  Face-to-face time with the patient , and post visit ordering of testing and therapeutics.    Crecencio Mc, MD

## 2020-04-15 ENCOUNTER — Ambulatory Visit
Admission: RE | Admit: 2020-04-15 | Discharge: 2020-04-15 | Disposition: A | Discharge status: Home / Self Care | Source: Ambulatory Visit | Attending: Internal Medicine | Admitting: Internal Medicine

## 2020-04-15 ENCOUNTER — Other Ambulatory Visit: Payer: Self-pay

## 2020-04-15 DIAGNOSIS — S3421XS Injury of nerve root of lumbar spine, sequela: Secondary | ICD-10-CM

## 2020-04-15 DIAGNOSIS — M5432 Sciatica, left side: Secondary | ICD-10-CM

## 2020-04-17 ENCOUNTER — Other Ambulatory Visit: Payer: Self-pay | Admitting: Internal Medicine

## 2020-04-17 DIAGNOSIS — M5432 Sciatica, left side: Secondary | ICD-10-CM

## 2020-04-17 MED ORDER — HYDROCODONE-ACETAMINOPHEN 10-325 MG PO TABS: 1.0000 | ORAL_TABLET | ORAL | 0 refills | Status: AC | PRN

## 2020-04-17 NOTE — Telephone Encounter (Signed)
Crecencio Mc, MD  04/16/2020 10:15 PM EDT      Your MRI of the lumbar spine suggested that degenerative changes at multiple levels have caused bulging disks that may be causing nerve root impingement which can cause pain that radiates to your legs.  If you are having pain without numbness or weakness of the left leg, Physical therapy may help relieve your pain and prevent need for surgery. I can order PT , but if you would prefer to see a neurosurgeon , I will make that referral.. Please let me know what you would like to do

## 2020-04-17 NOTE — Assessment & Plan Note (Signed)
MRI repeated with nerve root impingement at L5 n the left suspected

## 2020-04-19 ENCOUNTER — Other Ambulatory Visit: Payer: Self-pay | Admitting: Internal Medicine

## 2020-04-19 ENCOUNTER — Telehealth: Payer: Self-pay | Admitting: Internal Medicine

## 2020-04-19 DIAGNOSIS — M5432 Sciatica, left side: Secondary | ICD-10-CM

## 2020-04-19 NOTE — Telephone Encounter (Signed)
PT referral was cancelled. New referral received and sent.

## 2020-04-19 NOTE — Telephone Encounter (Signed)
Dean Obrien,  Can you please Disregard /cancel the PT referral.  Patient has decided ON neurosurgical referral  Regards,   Deborra Medina, MD

## 2020-04-21 MED ORDER — ONDANSETRON 4 MG PO TBDP: 4.0000 mg | ORAL_TABLET | Freq: Three times a day (TID) | ORAL | 0 refills | Status: DC | PRN

## 2020-04-25 ENCOUNTER — Other Ambulatory Visit: Payer: Self-pay

## 2020-04-25 ENCOUNTER — Ambulatory Visit
Admission: RE | Admit: 2020-04-25 | Discharge: 2020-04-25 | Disposition: A | Discharge status: Home / Self Care | Source: Ambulatory Visit | Attending: Oncology | Admitting: Oncology

## 2020-04-25 DIAGNOSIS — R918 Other nonspecific abnormal finding of lung field: Secondary | ICD-10-CM | POA: Insufficient documentation

## 2020-04-26 ENCOUNTER — Inpatient Hospital Stay: Attending: Oncology | Admitting: Oncology

## 2020-04-26 ENCOUNTER — Other Ambulatory Visit: Payer: Self-pay | Admitting: Internal Medicine

## 2020-04-26 DIAGNOSIS — I350 Nonrheumatic aortic (valve) stenosis: Secondary | ICD-10-CM | POA: Insufficient documentation

## 2020-04-26 DIAGNOSIS — E785 Hyperlipidemia, unspecified: Secondary | ICD-10-CM | POA: Diagnosis not present

## 2020-04-26 DIAGNOSIS — Z952 Presence of prosthetic heart valve: Secondary | ICD-10-CM | POA: Insufficient documentation

## 2020-04-26 DIAGNOSIS — Z87891 Personal history of nicotine dependence: Secondary | ICD-10-CM | POA: Insufficient documentation

## 2020-04-26 DIAGNOSIS — I2510 Atherosclerotic heart disease of native coronary artery without angina pectoris: Secondary | ICD-10-CM | POA: Diagnosis not present

## 2020-04-26 DIAGNOSIS — R918 Other nonspecific abnormal finding of lung field: Secondary | ICD-10-CM

## 2020-04-26 DIAGNOSIS — I10 Essential (primary) hypertension: Secondary | ICD-10-CM | POA: Diagnosis not present

## 2020-04-26 DIAGNOSIS — Z801 Family history of malignant neoplasm of trachea, bronchus and lung: Secondary | ICD-10-CM | POA: Insufficient documentation

## 2020-04-26 NOTE — Progress Notes (Signed)
Pulmonary Nodule Clinic Consult note [facility]  [phone] [phone]  Patient Care Team: Crecencio Mc, MD as PCP - General (Internal Medicine) Minna Merritts, MD as PCP - Cardiology (Cardiology)   Name of the patient: Dean Obrien  [id number]  October 20, 1960   Date of visit: 04/26/2020   Diagnosis- Lung Nodule  Chief complaint/ Reason for visit- Pulmonary Nodule Clinic Initial Visit  Past Medical History: Dean Obrien is a 60 year old male with past medical history significant for hypertension, CAD, nicotine dependence, prediabetes, hyperlipidemia and aortic valve replacement found to have multiple pulmonary nodules on CT angio chest aorta for follow-up of his aortic valve stenosis.  It showed several pulmonary nodules ranging from 3 mm to 7 mm in diameter.  Interval history-Dean Obrien presents today to review most recent CT scan.  Dean Obrien is a former smoker.  Dean Obrien smoked 1 to 2 packs of cigarettes per day for 45 years.  Dean Obrien quit smoking in August 2021 after having his aortic valve replaced.  Dean Obrien is followed by Dr. Derrel Nip his PCP for chronic conditions.  Dean Obrien was recently seen for intermittent sciatic pain that was progressive.  Dean Obrien was given a steroid taper, gabapentin and hydrocodone without relief.  Had MRI lumbar spine on 04/15/2020 which showed L4-L5 disc herniation resulting in severe left foraminal stenosis and mild to moderate right foraminal stenosis.  Correlate for radicular symptoms within the left L4 nerve root distribution.  Dean Obrien was referred to neurosurgery.   Dean Obrien was evaluated by pulmonology Dr. Patsey Berthold for pulmonary nodules.  Had PFTs on 02/16/2020 which showed mild decrease in lung volumes diffuse opacity consistent with very mild restriction.  Dean Obrien denies any personal history of cancer.  Has family history positive for lung cancer.  Both his brother and father died of lung cancer.  Denies any known occupational exposure known to cause  cancer.   Today, Dean Obrien reports doing well except for persistent sciatic low back pain.  Reports Dean Obrien is scheduled to see a spine specialist next week and a neurosurgeon in May for possible surgery.  Dean Obrien continues to take his Vicodin for pain.  Dean Obrien lives at home with his wife who is paralyzed and requires full assistance with all of her ADLs.  Dean Obrien is unable to help due to back pain.  His daughter is having to take care of his wife.   ECOG FS:0 - Asymptomatic  Review of systems- Review of Systems  Constitutional: Negative.  Negative for chills, fever, malaise/fatigue and weight loss.  HENT: Negative for congestion, ear pain and tinnitus.   Eyes: Negative.  Negative for blurred vision and double vision.  Respiratory: Negative.  Negative for cough, sputum production and shortness of breath.   Cardiovascular: Negative.  Negative for chest pain, palpitations and leg swelling.  Gastrointestinal: Negative.  Negative for abdominal pain, constipation, diarrhea, nausea and vomiting.  Genitourinary: Negative for dysuria, frequency and urgency.  Musculoskeletal: Positive for back pain. Negative for falls.  Skin: Negative.  Negative for rash.  Neurological: Negative.  Negative for weakness and headaches.  Endo/Heme/Allergies: Negative.  Does not bruise/bleed easily.  Psychiatric/Behavioral: Negative.  Negative for depression. The patient is not nervous/anxious and does not have insomnia.      Allergies  Allergen Reactions  . Benadryl [Diphenhydramine]     Racing heart, syncope  . Tramadol Itching, Nausea Only and Rash     Past Medical History:  Diagnosis Date  . Aortic stenosis, severe 09/09/2018   RHC 09/10/19 Heavily calcified AV.  VTI 0.42 cm. AV mean gradient 84.7 mmHg. Vmax measures 5.51 m/s. nl filling pressures, nl pulmonary pressure, nl CO.  Marland Kitchen CAD (coronary artery disease)    LHC 09/10/2019: pRCA 90%s, pLCx 20%s, mLAD 50%s  . Critical aortic valve stenosis 09/09/2019  . Diastolic dysfunction     1/61/0960  . Hyperlipidemia LDL goal <70    8/19 LDL 87  . Hypertension   . NSTEMI (non-ST elevated myocardial infarction) ([place]) 09/09/2019  . Prediabetes      Past Surgical History:  Procedure Laterality Date  . AORTIC VALVE REPLACEMENT N/A 09/14/2019   Procedure: AORTIC VALVE REPLACEMENT (AVR) USING INSPIRIS VALVE SIZE 25MM;  Surgeon: Wonda Olds, MD;  Location: [facility];  Service: Open Heart Surgery;  Laterality: N/A;  . CORONARY ARTERY BYPASS GRAFT N/A 09/14/2019   Procedure: CORONARY ARTERY BYPASS GRAFTING (CABG) x Two using bilateral Internal mammary arteries;  Surgeon: Wonda Olds, MD;  Location: [facility];  Service: Open Heart Surgery;  Laterality: N/A;  . RIGHT/LEFT HEART CATH AND CORONARY ANGIOGRAPHY N/A 09/10/2019   Procedure: RIGHT/LEFT HEART CATH AND CORONARY ANGIOGRAPHY;  Surgeon: Wellington Hampshire, MD;  Location: [facility];  Service: Cardiovascular;  Laterality: N/A;  . TEE WITHOUT CARDIOVERSION N/A 09/14/2019   Procedure: TRANSESOPHAGEAL ECHOCARDIOGRAM (TEE);  Surgeon: Wonda Olds, MD;  Location: [facility];  Service: Open Heart Surgery;  Laterality: N/A;    Social History   Socioeconomic History  . Marital status: Married    Spouse name: Not on file  . Number of children: Not on file  . Years of education: Not on file  . Highest education level: Not on file  Occupational History  . Occupation: Social research officer, government (desk job)    Comment: Retired  . 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 Smoking status: Former Smoker    Packs/day: 1.50    Years: 40.00    Pack years: 60.00    Quit date: 09/08/2019    Years since quitting: 0.6  . Smokeless tobacco: Never Used  Vaping Use  . Vaping Use: Never used  Substance and Sexual Activity  . Alcohol use: No  . Drug use: No  . Sexual activity: Yes    Partners: Female    Birth control/protection: None  Other Topics Concern  . Not on file  Social History Narrative   Pt gets regular exercise.    Social Determinants of Health   Financial Resource Strain: Not on file  Food Insecurity: Not on file  Transportation Needs: Not on file  Physical Activity: Not on file  Stress: Not on file  Social Connections: Not on file  Intimate Partner Violence: Not on file    Family History  Problem Relation Age of Onset  . Emphysema Mother   . Liver disease Mother   . Lung cancer Father   . Hypertension Sister   . Hypertension Brother      Current Outpatient Medications:  .  aspirin EC 81 MG EC tablet, Take 1 tablet (81 mg total) by mouth daily. Swallow whole., Disp: 30 tablet, Rfl: 11 .  atorvastatin (LIPITOR) 40 MG tablet, Take 1 tablet (40 mg total) by mouth daily., Disp: 90 tablet, Rfl: 1 .  clopidogrel (PLAVIX) 75 MG tablet, TAKE 1 TABLET(75 MG) BY MOUTH DAILY, Disp: 90 tablet, Rfl: 1 .  fluticasone (FLONASE) 50 MCG/ACT nasal spray, SHAKE LIQUID AND USE 2 SPRAYS IN EACH NOSTRIL DAILY, Disp: 48 g, Rfl: 3 .  gabapentin (NEURONTIN)  100 MG capsule, TAKE 1 CAPSULE(100 MG) BY MOUTH THREE TIMES DAILY, Disp: 90 capsule, Rfl: 3 .  metoprolol tartrate (LOPRESSOR) 25 MG tablet, TAKE 1 TABLET(25 MG) BY MOUTH TWICE DAILY, Disp: 180 tablet, Rfl: 1 .  ondansetron (ZOFRAN ODT) 4 MG disintegrating tablet, Take 1 tablet (4 mg total) by mouth every 8 (eight) hours as needed for nausea or vomiting., Disp: 20 tablet, Rfl: 0 .  telmisartan (MICARDIS) 20 MG tablet, Take 1 tablet (20 mg total) by mouth at bedtime., Disp: 90 tablet, Rfl: 1 .  tizanidine (ZANAFLEX) 2 MG capsule, Take 1 capsule (2 mg total) by mouth 3 (three) times daily as needed for muscle spasms., Disp: 60 capsule, Rfl: 1 .  traZODone (DESYREL) 50 MG tablet, Take 0.5-1 tablets (25-50 mg total) by mouth at bedtime as needed for sleep., Disp: 90 tablet, Rfl: 3  Physical exam: There were no vitals filed for this visit. Physical Exam Constitutional:      Appearance: Normal appearance.  HENT:     Head: Normocephalic and atraumatic.  Eyes:      Pupils: Pupils are equal, round, and reactive to light.  Cardiovascular:     Rate and Rhythm: Normal rate and regular rhythm.     Heart sounds: Normal heart sounds. No murmur heard.   Pulmonary:     Effort: Pulmonary effort is normal.     Breath sounds: Normal breath sounds. No wheezing.  Abdominal:     General: Bowel sounds are normal. There is no distension.     Palpations: Abdomen is soft.     Tenderness: There is no abdominal tenderness.  Musculoskeletal:        General: Normal range of motion.     Cervical back: Normal range of motion.  Skin:    General: Skin is warm and dry.     Findings: No rash.  Neurological:     Mental Status: Dean Obrien is alert and oriented to person, place, and time.  Psychiatric:        Judgment: Judgment normal.      CMP Latest Ref Rng & Units 12/31/2019  Glucose 70 - 99 mg/dL 111(H)  BUN 6 - 23 mg/dL 14  Creatinine 0.40 - 1.50 mg/dL 0.64  Sodium 135 - 145 mEq/L 135  Potassium 3.5 - 5.1 mEq/L 4.4  Chloride 96 - 112 mEq/L 100  CO2 19 - 32 mEq/L 27  Calcium 8.4 - 10.5 mg/dL 9.4  Total Protein 6.0 - 8.3 g/dL -  Total Bilirubin 0.2 - 1.2 mg/dL -  Alkaline Phos 39 - 117 U/L -  AST 0 - 37 U/L -  ALT 0 - 53 U/L -   CBC Latest Ref Rng & Units 12/31/2019  WBC 4.0 - 10.5 K/uL 9.6  Hemoglobin 13.0 - 17.0 g/dL 13.9  Hematocrit 39.0 - 52.0 % 41.8  Platelets 150.0 - 400.0 K/uL 193.0    No images are attached to the encounter.  CT Chest Wo Contrast  Result Date: 04/26/2020 CLINICAL DATA:  Lung nodule. EXAM: CT CHEST WITHOUT CONTRAST TECHNIQUE: Multidetector CT imaging of the chest was performed following the standard protocol without IV contrast. COMPARISON:  09/12/2019. FINDINGS: Cardiovascular: Atherosclerotic calcification of the aorta. Aortic valve replacement. Heart size normal. No pericardial effusion. Mediastinum/Nodes: Mediastinal lymph nodes are not enlarged by CT size criteria. Hilar regions are difficult to definitively evaluate without IV  contrast. No axillary adenopathy. Subcutaneous sebaceous cyst overlies the right deltoid musculature. Esophagus is grossly unremarkable. Lungs/Pleura: Centrilobular and paraseptal emphysema. Residual smoking  related respiratory bronchiolitis. Vague 5 mm nodule in the apical left upper lobe (3/28), similar to minimally smaller than on 09/12/2019, at which time it measured approximately 7 mm. Other millimetric pulmonary nodules are unchanged. No pleural fluid. Airway is unremarkable. Upper Abdomen: Visualized portions of the liver and gallbladder are unremarkable. Biliary ductal dilatation appears grossly similar. Visualized portions of the adrenal glands, kidneys, spleen, pancreas, stomach and bowel are grossly unremarkable. No upper abdominal adenopathy. Musculoskeletal: Degenerative changes in the spine. No worrisome lytic or sclerotic lesions. Old rib fractures. IMPRESSION: 1. Scattered small pulmonary nodules are unchanged or minimally decreased in size from 09/12/2019. Consider ongoing evaluation with annual low-dose lung cancer screening CT. 2.  Aortic atherosclerosis (ICD10-I70.0). 3.  Emphysema (ICD10-J43.9). Electronically Signed   By: Lorin Picket M.D.   On: 04/26/2020 09:04   MR Lumbar Spine Wo Contrast  Result Date: 04/15/2020 CLINICAL DATA:  Low back pain with left-sided radiculopathy for 1 month EXAM: MRI LUMBAR SPINE WITHOUT CONTRAST TECHNIQUE: Multiplanar, multisequence MR imaging of the lumbar spine was performed. No intravenous contrast was administered. COMPARISON:  09/10/2013 FINDINGS: Segmentation:  Standard. Alignment:  Physiologic. Vertebrae: No fracture, evidence of discitis, or bone lesion. Discogenic endplate marrow changes at L4-5 with small superior endplate L5 Schmorl's node. Multilevel anterior endplate spurring. Conus medullaris and cauda equina: Conus extends to the L1 level. Conus and cauda equina appear normal. Paraspinal and other soft tissues: Negative. Disc levels: T12-L1:  No significant disc protrusion, foraminal stenosis, or canal stenosis. Unchanged. L1-L2: Minimal broad-based disc bulge. No foraminal or canal stenosis. Unchanged. L2-L3: Mild broad-based disc bulge. No foraminal or canal stenosis. Unchanged. L3-L4: Circumferential disc bulge and mild bilateral facet hypertrophy. Mild right greater than left subarticular recess stenosis. Moderate right and mild left foraminal stenosis. No canal stenosis. Unchanged. L4-L5: Circumferential disc bulge with prominent left foraminal/extraforaminal disc herniation. Mild bilateral facet arthropathy. Severe left foraminal stenosis. Mild-moderate right foraminal stenosis. No canal stenosis. Unchanged. L5-S1: Mild circumferential disc bulge. Mild bilateral facet hypertrophy. Moderate left and mild right foraminal stenosis. No canal stenosis. Unchanged. IMPRESSION: 1. Similar degree of multilevel lumbar spondylosis, as described above. Findings are most pronounced at L4-5 where there is a prominent left foraminal/extraforaminal disc herniation resulting in severe left foraminal stenosis and mild-moderate right foraminal stenosis. Correlate for radicular symptoms within the left L4 nerve root distribution. 2. Moderate right and mild left foraminal stenosis at L3-4. 3. Moderate left and mild right foraminal stenosis at L5-S1. 4. No canal stenosis at any level. Electronically Signed   By: Davina Poke D.O.   On: 04/15/2020 12:09     Assessment and plan- Patient is a 60 y.o. male who presents to pulmonary nodule clinic for follow-up of incidental lung nodules.    CT chest without contrast from 04/25/2020 showed scattered small pulmonary nodules that are unchanged or minimally decreased from 09/12/2019.  Consider ongoing evaluation with annual low-dose lung cancer screening.   Calculating malignancy probability of a pulmonary nodule: Risk factors include: 1.  Age. 2.  Cancer history. 3.  Diameter of pulmonary nodule and mm 4.   Location 5.  Smoking history 6.  Spiculation present   Based on risk factors, this patient is high risk for the development of lung cancer given his smoking history.  Given the stability/decrease in size of lung nodules would recommend follow-up with our low-dose CT screening program.  During our visit, we discussed pulmonary nodules are a common incidental finding and are often how lung cancer is discovered.  Lung cancer survival is directly related to the stage at diagnosis.  We discussed that nodules can vary in presentation from solitary pulmonary nodules to masses, 2 groundglass opacities and multiple nodules.  Pulmonary nodules in the majority of cases are benign but the probability of these becoming malignant cannot be undermined.  Early identification of malignant nodules could lead to early diagnosis and increased survival.   We discussed the probability of pulmonary nodules becoming malignant increase with age, pack years of tobacco use, size/characteristics of the nodule and location; with upper lobe involvement being most worrisome.   We discussed the goal of our clinic is to thoroughly evaluate each nodule, developed a comprehensive, individualized plan of care utilizing the most advanced technology and significantly reduce the time from detection to treatment.  A dedicated pulmonary nodule clinic has proven to indeed expedite the detection and treatment of lung cancer.   Patient education in fact sheet provided along with most recent CT scans.  Assessment/plan: -We discussed results recent CT chest -We discussed smoking history. -We discussed past medical history. -We discussed personal and family history of cancer. -We discussed referral to low-dose CT screening program-smoked for greater than 20 pack years, age 89 and quit smoking approximately 6 months ago  Disposition: -No additional follow-up needed in our lung nodule program. -Referral to low-dose CT screening program.    Visit Diagnosis 1. Multiple pulmonary nodules determined by computed tomography of lung     Patient expressed understanding and was in agreement with this plan. Dean Obrien also understands that Dean Obrien can call clinic at any time with any questions, concerns, or complaints.   Greater than 50% was spent in counseling and coordination of care with this patient including but not limited to discussion of the relevant topics above (See A&P) including, but not limited to diagnosis and management of acute and chronic medical conditions.   Thank you for allowing me to participate in the care of this very pleasant patient.    Jacquelin Hawking, NP [place] [facility] Cell - [phone] Pager- [phone] 04/26/2020 2:01 PM   CC: Dr. Derrel Nip

## 2020-04-27 NOTE — Progress Notes (Signed)
Cardiology Office Note  Date:  04/28/2020   ID:  Dean [facility], DOB 02-15-1960, MRN [id number]  PCP:  Dean Mc, MD   Chief Complaint  Patient presents with  . 6 month follow up     "doing well." Medications reviewed by the patient verbally.     HPI:  60 year old gentleman PMH of  Smoking, quit 08/2019 CAD Severe aortic valve stenosis status post CABG x2 and aortic valve replacement on 09/14/2019.   In follow-up today, reports he is doing relatively well Restarted stocking shelves at [employer]  Back pain, chronic On pain meds, Schedule to see [facility]  No SOB, or chest pain No leg swelling, no PND orthopnea  Previously with suture is poking through the chest Surgery clipped it  Echo 10/2019 reviewed with him in detail 1. Left ventricular ejection fraction, by estimation, is 50 to 55%. The  left ventricle has low normal function. The left ventricle has no regional  wall motion abnormalities. Left ventricular diastolic parameters are  consistent with Grade II diastolic  dysfunction (pseudonormalization). Elevated left atrial pressure.  2. Right ventricular systolic function is moderately reduced. The right  ventricular size is normal.  5. The aortic valve has been repaired/replaced. Aortic valve  regurgitation is not visualized. No aortic stenosis is present. There is a  25 mm Edwards Inspiris-Resilia valve present in the aortic position.  Procedure Date: 09/14/19.   EKG personally reviewed by myself on todays visit NSR rate 59 bpm  Other past medical history reviewed unstable angina sx with syncope  in 08/2019, leading to hospitalization,   Echo 09/09/2019 with critical aortic stenosis with valve area by VTI 0.42 cm, AV mean gradient 84.7 mmHg, aortic valve V-max 5.15 m/s, LVEF 55 to 60%, no RWMA, gr1DD, mild MR.   R/LHC 09/10/2019 significant one-vessel CAD with P RCA 90% stenosed, P LCx 20% stenosis, moderate M LAD 50% stenosis.  CTA with probable  bicuspid aortic valve.  Ascending aorta mildly enlarged at 3.7 cm tapering to 3.4 cm in arch.  He had a 7 mm nodule in LUL that will require CT follow-up in 6 months due to his tobacco use.  08/25/2019 he underwent CABGx2 (LIM-LAD, RIMA-RCA) and AVR with 25 mm Edwards Inspiris bioprosthetic.  He went into atrial fibrillation in the postoperative period and was treated with amiodarone drip.   PMH:   has a past medical history of Aortic stenosis, severe (09/09/2018), CAD (coronary artery disease), Critical aortic valve stenosis (4/70/9628), Diastolic dysfunction, Hyperlipidemia LDL goal <70, Hypertension, NSTEMI (non-ST elevated myocardial infarction) ([place]) (09/09/2019), and Prediabetes.  PSH:    Past Surgical History:  Procedure Laterality Date  . AORTIC VALVE REPLACEMENT N/A 09/14/2019   Procedure: AORTIC VALVE REPLACEMENT (AVR) USING INSPIRIS VALVE SIZE 25MM;  Surgeon: Dean Olds, MD;  Location: [facility];  Service: Open Heart Surgery;  Laterality: N/A;  . CORONARY ARTERY BYPASS GRAFT N/A 09/14/2019   Procedure: CORONARY ARTERY BYPASS GRAFTING (CABG) x Two using bilateral Internal mammary arteries;  Surgeon: Dean Olds, MD;  Location: [facility];  Service: Open Heart Surgery;  Laterality: N/A;  . RIGHT/LEFT HEART CATH AND CORONARY ANGIOGRAPHY N/A 09/10/2019   Procedure: RIGHT/LEFT HEART CATH AND CORONARY ANGIOGRAPHY;  Surgeon: Dean Hampshire, MD;  Location: [facility];  Service: Cardiovascular;  Laterality: N/A;  . TEE WITHOUT CARDIOVERSION N/A 09/14/2019   Procedure: TRANSESOPHAGEAL ECHOCARDIOGRAM (TEE);  Surgeon: Dean Olds, MD;  Location: [facility];  Service: Open Heart Surgery;  Laterality: N/A;  Current Outpatient Medications  Medication Sig Dispense Refill  . aspirin EC 81 MG EC tablet Take 1 tablet (81 mg total) by mouth daily. Swallow whole. 30 tablet 11  . atorvastatin (LIPITOR) 40 MG tablet Take 1 tablet (40 mg total) by mouth daily. 90 tablet 1  . clopidogrel  (PLAVIX) 75 MG tablet TAKE 1 TABLET(75 MG) BY MOUTH DAILY 90 tablet 1  . fluticasone (FLONASE) 50 MCG/ACT nasal spray SHAKE LIQUID AND USE 2 SPRAYS IN EACH NOSTRIL DAILY 48 g 3  . gabapentin (NEURONTIN) 300 MG capsule Take 300 mg by mouth 3 (three) times daily.    Marland Kitchen HYDROcodone-acetaminophen (NORCO) 10-325 MG tablet Take 1 tablet by mouth every 6 (six) hours as needed.    . metoprolol tartrate (LOPRESSOR) 25 MG tablet TAKE 1 TABLET(25 MG) BY MOUTH TWICE DAILY 180 tablet 1  . ondansetron (ZOFRAN ODT) 4 MG disintegrating tablet Take 1 tablet (4 mg total) by mouth every 8 (eight) hours as needed for nausea or vomiting. 20 tablet 0  . telmisartan (MICARDIS) 20 MG tablet TAKE 1 TABLET(20 MG) BY MOUTH AT BEDTIME 90 tablet 1  . tizanidine (ZANAFLEX) 2 MG capsule Take 1 capsule (2 mg total) by mouth 3 (three) times daily as needed for muscle spasms. 60 capsule 1  . traZODone (DESYREL) 50 MG tablet Take 0.5-1 tablets (25-50 mg total) by mouth at bedtime as needed for sleep. 90 tablet 3   No current facility-administered medications for this visit.     Allergies:   Benadryl [diphenhydramine] and Tramadol   Social History:  The patient  reports that he quit smoking about 7 months ago. He has a 60.00 pack-year smoking history. He has never used smokeless tobacco. He reports that he does not drink alcohol and does not use drugs.   Family History:   family history includes Emphysema in his mother; Hypertension in his brother and sister; Liver disease in his mother; Lung cancer in his father.    Review of Systems: Review of Systems  Constitutional: Negative.   HENT: Negative.   Respiratory: Negative.   Cardiovascular: Negative.   Gastrointestinal: Negative.   Musculoskeletal: Negative.   Neurological: Negative.   Psychiatric/Behavioral: Negative.   All other systems reviewed and are negative.   PHYSICAL EXAM: VS:  BP 130/82 (BP Location: Left Arm, Patient Position: Sitting, Cuff Size: Normal)    Pulse (!) 59   Ht 5\' 3"  (1.6 m)   Wt 150 lb 6 oz (68.2 kg)   SpO2 98%   BMI 26.64 kg/m  , BMI Body mass index is 26.64 kg/m. Constitutional:  oriented to person, place, and time. No distress.  HENT:  Head: Grossly normal Eyes:  no discharge. No scleral icterus.  Neck: No JVD, no carotid bruits  Cardiovascular: Regular rate and rhythm, no murmurs appreciated Pulmonary/Chest: Clear to auscultation bilaterally, no wheezes or rails Abdominal: Soft.  no distension.  no tenderness.  Musculoskeletal: Normal range of motion Neurological:  normal muscle tone. Coordination normal. No atrophy Skin: Skin warm and dry Psychiatric: normal affect, pleasant  Recent Labs: 09/09/2019: TSH 1.224 09/15/2019: Magnesium 2.2 10/22/2019: ALT 13 12/31/2019: BUN 14; Creatinine, Ser 0.64; Hemoglobin 13.9; Platelets 193.0; Potassium 4.4; Sodium 135    Lipid Panel Lab Results  Component Value Date   CHOL 90 (L) 11/09/2019   HDL 47 11/09/2019   LDLCALC 31 11/09/2019   TRIG 46 11/09/2019    Wt Readings from Last 3 Encounters:  04/28/20 150 lb 6 oz (68.2 kg)  04/10/20  144 lb (65.3 kg)  02/24/20 151 lb (68.5 kg)     ASSESSMENT AND PLAN:  Problem List Items Addressed This Visit      Cardiology Problems   Hyperlipidemia LDL goal <70   Coronary artery disease - Primary    Other Visit Diagnoses    Severe aortic stenosis       Hx of CABG       S/P AVR       Essential hypertension       Tobacco use         CAD with stable angina Grafting x2, denies any anginal symptoms stopped smoking, cholesterol at goal No medication changes made  AVR Details discussed,  Bioprosthetic valve On asa and plavix Discuss timing of coming off the Plavix, he prefers to stay on Plavix for now Discussed risk of bleeding, they will stay vigilant and call us for any new symptoms Echo reviewed from 2021 Repeat end of 2023  Hyperlipidemia Cholesterol is at goal on the current lipid regimen. No changes to the  medications were made.    Total encounter time more than 25 minutes  Greater than 50% was spent in counseling and coordination of care with the patient    Signed, Esmond Plants, M.D., Ph.D. [place], [place]

## 2020-04-28 ENCOUNTER — Ambulatory Visit (INDEPENDENT_AMBULATORY_CARE_PROVIDER_SITE_OTHER): Admitting: Cardiovascular Disease

## 2020-04-28 ENCOUNTER — Encounter: Payer: Self-pay | Admitting: Cardiovascular Disease

## 2020-04-28 ENCOUNTER — Other Ambulatory Visit: Payer: Self-pay

## 2020-04-28 VITALS — BP 130/82 | HR 59 | Ht 63.0 in | Wt 150.4 lb

## 2020-04-28 DIAGNOSIS — Z720 Tobacco use: Secondary | ICD-10-CM

## 2020-04-28 DIAGNOSIS — Z952 Presence of prosthetic heart valve: Secondary | ICD-10-CM | POA: Diagnosis not present

## 2020-04-28 DIAGNOSIS — I350 Nonrheumatic aortic (valve) stenosis: Secondary | ICD-10-CM | POA: Diagnosis not present

## 2020-04-28 DIAGNOSIS — E785 Hyperlipidemia, unspecified: Secondary | ICD-10-CM

## 2020-04-28 DIAGNOSIS — Z951 Presence of aortocoronary bypass graft: Secondary | ICD-10-CM | POA: Diagnosis not present

## 2020-04-28 DIAGNOSIS — I25118 Atherosclerotic heart disease of native coronary artery with other forms of angina pectoris: Secondary | ICD-10-CM

## 2020-04-28 DIAGNOSIS — I10 Essential (primary) hypertension: Secondary | ICD-10-CM

## 2020-04-28 MED ORDER — METOPROLOL TARTRATE 25 MG PO TABS: ORAL_TABLET | ORAL | 3 refills | Status: DC

## 2020-04-28 MED ORDER — TELMISARTAN 20 MG PO TABS: ORAL_TABLET | ORAL | 3 refills | Status: DC

## 2020-04-28 MED ORDER — ATORVASTATIN CALCIUM 40 MG PO TABS: 40.0000 mg | ORAL_TABLET | Freq: Every day | ORAL | 3 refills | Status: DC

## 2020-04-28 MED ORDER — CLOPIDOGREL BISULFATE 75 MG PO TABS: ORAL_TABLET | ORAL | 3 refills | Status: DC

## 2020-04-28 NOTE — Patient Instructions (Addendum)
Medication Instructions:  No changes  All of your cardiac medications have been refilled, they will be ready for pick up  later today  Lab work: No new labs needed  Testing/Procedures: No new testing needed   Follow-Up:  . You will need a follow up appointment in 12 months  . Providers on your designated Care Team:   . Murray Hodgkins, NP . Christell Faith, PA-C . Marrianne Mood, PA-C   COVID-19 Vaccine Information can be found at: [email or web address].uk For questions related to vaccine distribution or appointments, please email vaccine@ [email or web address] or call [phone].

## 2020-05-29 ENCOUNTER — Other Ambulatory Visit: Payer: Self-pay

## 2020-05-30 MED ORDER — ONDANSETRON 4 MG PO TBDP: 4.0000 mg | ORAL_TABLET | Freq: Three times a day (TID) | ORAL | 0 refills | Status: DC | PRN

## 2020-06-04 ENCOUNTER — Other Ambulatory Visit: Payer: Self-pay | Admitting: Internal Medicine

## 2020-06-04 MED ORDER — HYDROCODONE-ACETAMINOPHEN 10-325 MG PO TABS: 1.0000 | ORAL_TABLET | Freq: Four times a day (QID) | ORAL | 0 refills | Status: DC | PRN

## 2020-06-23 ENCOUNTER — Other Ambulatory Visit: Payer: Self-pay | Admitting: Family

## 2020-06-23 DIAGNOSIS — I25118 Atherosclerotic heart disease of native coronary artery with other forms of angina pectoris: Secondary | ICD-10-CM

## 2020-06-26 NOTE — Patient Instructions (Addendum)
Access Code: [email or web address] URL: [email or web address] Date: 06/27/2020 Prepared by: Roxana Hires  Exercises Prone on Elbows Stretch - 6 x daily - 7 x weekly - 2 minutes progressing to 5 minutes at a time hold Standing Lumbar Extension - 3 x daily - 7 x weekly - 1 sets - 10 reps - 5s hold

## 2020-06-27 ENCOUNTER — Other Ambulatory Visit: Payer: Self-pay

## 2020-06-27 ENCOUNTER — Ambulatory Visit: Attending: Family Medicine

## 2020-06-27 DIAGNOSIS — G8929 Other chronic pain: Secondary | ICD-10-CM | POA: Diagnosis present

## 2020-06-27 DIAGNOSIS — M5442 Lumbago with sciatica, left side: Secondary | ICD-10-CM | POA: Diagnosis present

## 2020-06-27 DIAGNOSIS — M6281 Muscle weakness (generalized): Secondary | ICD-10-CM | POA: Insufficient documentation

## 2020-06-27 NOTE — Therapy (Signed)
[facility] [address] [place], [place], [address] Phone: [phone]   Fax:  [phone]  Physical Therapy Evaluation  Patient Details  Name: Dean Obrien MRN: [id number] Date of Birth: 1960/08/14 Referring Provider (PT): Meeler, Whitney   Encounter Date: 06/27/2020   PT End of Session - 06/27/20 1350    Visit Number 1    Number of Visits 17    Date for PT Re-Evaluation 08/22/20    Authorization Type eval: 06/27/20    PT Start Time 0855    PT Stop Time 0937    PT Time Calculation (min) 42 min    Activity Tolerance Patient tolerated treatment well    Behavior During Therapy [facility] for tasks assessed/performed           Past Medical History:  Diagnosis Date  . Aortic stenosis, severe 09/09/2018   RHC 09/10/19 Heavily calcified AV. VTI 0.42 cm. AV mean gradient 84.7 mmHg. Vmax measures 5.51 m/s. nl filling pressures, nl pulmonary pressure, nl CO.  Marland Kitchen CAD (coronary artery disease)    LHC 09/10/2019: pRCA 90%s, pLCx 20%s, mLAD 50%s  . Critical aortic valve stenosis 09/09/2019  . Diastolic dysfunction    1/75/1025  . Hyperlipidemia LDL goal <70    8/19 LDL 87  . Hypertension   . NSTEMI (non-ST elevated myocardial infarction) ([place]) 09/09/2019  . Prediabetes     Past Surgical History:  Procedure Laterality Date  . AORTIC VALVE REPLACEMENT N/A 09/14/2019   Procedure: AORTIC VALVE REPLACEMENT (AVR) USING INSPIRIS VALVE SIZE 25MM;  Surgeon: Wonda Olds, MD;  Location: [place];  Service: Open Heart Surgery;  Laterality: N/A;  . CORONARY ARTERY BYPASS GRAFT N/A 09/14/2019   Procedure: CORONARY ARTERY BYPASS GRAFTING (CABG) x Two using bilateral Internal mammary arteries;  Surgeon: Wonda Olds, MD;  Location: [facility];  Service: Open Heart Surgery;  Laterality: N/A;  . RIGHT/LEFT HEART CATH AND CORONARY ANGIOGRAPHY N/A 09/10/2019   Procedure: RIGHT/LEFT HEART CATH AND CORONARY ANGIOGRAPHY;  Surgeon: Wellington Hampshire, MD;  Location:  [place] CV LAB;  Service: Cardiovascular;  Laterality: N/A;  . TEE WITHOUT CARDIOVERSION N/A 09/14/2019   Procedure: TRANSESOPHAGEAL ECHOCARDIOGRAM (TEE);  Surgeon: Wonda Olds, MD;  Location: [place];  Service: Open Heart Surgery;  Laterality: N/A;    There were no vitals filed for this visit.    Subjective Assessment - 06/27/20 1337    Subjective Low back pain    Pertinent History Pt arrives complaining of left-sided low back pain with radiating pain in the left buttock and into the left lateral and anterior lateral thigh extending to the anterior portion of the shin and the lateral calf. First episode of back pain was in 2015. No known trauma. Symptoms started as a sharp, intense (9/10) pain in his lower LLE. He saw his PCP who prescribed Gabapentin. At that time he also saw a chiropractor without any relief. He was sent for an injection and 2 weeks after the injection his pain resolved. Since that time he gets pain in his lower back a couple times/year. If he is careful the pain resolves within a few days/weeks. At the end of February he started to have the pain again however it has continued to worsen. After a month he went to his PCP who referred him to physiatry. He had a spinal injection with mild (approximately 20%) relief of his pain. He was referred to neurosurgery and according to the patient he was told that  he "probably needs surgery" but recommended physical therapy first. Pt cares for his wife who has MS and is wheelchair-bound. 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 He had a lumbar MRI which showed multilevel lumbar spondylosis. Findings are most pronounced at L4-5 where there is a prominent left foraminal/extraforaminal disc herniation resulting in severe left foraminal stenosis and mild-moderate right foraminal stenosis. Correlate for radicular symptoms within the left L4 nerve root distribution. Moderate right and  mild left foraminal stenosis at L3-4. Moderate left and mild right foraminal stenosis at L5-S1. No canal stenosis at any level. PMH includes CAD, hyperlipidemia, HTN, prediabetes.    Limitations Sitting;Walking;Standing    How long can you sit comfortably? 30 minutes    How long can you stand comfortably? 5 minutes    How long can you walk comfortably? 5 minutes    Diagnostic tests see history    Patient Stated Goals "I need to feel that the pain is decreasing" Pt would like to be active for >1 hour without stopping due to pain.    Currently in Pain? 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[employer]), "any movement"    Pain Relieving Factors leaning on surface for support, R lateral flexion stretch with cavitation, Vicoden (with severe pain)    Effect of Pain on Daily Activities Limits his ability to walk and care for his wife              [facility] PT Assessment - 06/27/20 1348      Assessment   Medical Diagnosis Other intervertebral disc degeneration, lumbar; Other intervertebral disc displacement, lumbar; spinal stenosis lumbar region; radiculopathy,lumbar region    Referring Provider (PT) Meeler, Whitney    Onset Date/Surgical Date 03/13/20   Approximate   Hand Dominance Right    Next MD Visit not reported    Prior Therapy none for this issue      Precautions   Precautions None      Restrictions   Weight Bearing Restrictions No      Balance Screen   Has the patient fallen in the past 6 months No    Has the patient had a decrease in activity level because of a fear of falling?  No    Is the patient reluctant to leave their home because of a fear of falling?   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 First episode of back pain was in 2015. No known trauma. Symptoms started as a sharp, intense (9/10) pain in his lower LLE. He saw his PCP who prescribed Gabapentin. At that time he also saw a chiropractor without any relief. He was sent for an injection and 2 weeks after the injection his pain resolved. Since that time he gets pain in his lower back a couple times/year. If he is careful the pain resolves within a few days/weeks. At the end of February he started to have the pain again however it has continued to worsen. After a month he went to his PCP who referred him to physiatry. He had a spinal injection with mild (approximately 20%) relief of his pain. He was referred to neurosurgery and according to the patient he was told that he "probably needs surgery" but recommended physical therapy first. Pt cares for his wife who has MS and is wheelchair-bound. 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 He had a lumbar MRI which showed multilevel lumbar spondylosis. Findings are most pronounced at L4-5 where there is a prominent left foraminal/extraforaminal disc herniation resulting in severe left foraminal stenosis and mild-moderate right foraminal stenosis. Correlate for radicular symptoms within the left L4 nerve root distribution. Moderate right and mild left  foraminal stenosis at L3-4. Moderate left and mild right foraminal stenosis at L5-S1. No canal stenosis at any level. PMH includes CAD, hyperlipidemia, HTN, prediabetes.  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 Follow-up appointment with MD: Yes, after PT was advised to follow-up with neurosurgery Dominant hand: right Imaging: Yes, see history Falls in the last 6 months: No  Occupational demands: Part time at [employer]: Working in yard Goals: "I need to feel that the pain is decreasing" Pt would like to be active for >1 hour without stopping due to pain.     OBJECTIVE  Mental Status Patient is oriented to person, place and time.  Recent memory is intact.  Remote memory is intact.  Attention span and concentration are intact.  Expressive speech is intact.  Patient's fund of knowledge is within normal limits for educational level.  SENSATION: Grossly intact to light touch bilateral LEs as determined by testing dermatomes L2-S2 with the exception of slightly diminished L medial calf (L3-4) Proprioception and hot/cold testing deferred on this date   MUSCULOSKELETAL: Tremor: None Bulk: Normal, he does appear to have more muscle bulk in his R lumbar paraspinals compared to the L side Tone:  Normal No visible step-off along spinal column  Posture Lumbar lordosis: WNL Iliac crest height: equal bilaterally Lumbar lateral shift: negative  Gait Mildly antalgic on the LLE   Palpation No pain with palpation to bilateral lumbar paraspinals or posterior hips   Strength (out of 5) R/L 5/5 Hip flexion 5/5 Hip ER 5/5 Hip IR 4+/4+ Hip abduction 4+/4+ Hip adduction 4/4 Hip extension 5/5 Knee extension 5/5 Knee flexion 5/5 Ankle dorsiflexion Strong/Strong Ankle plantarflexion *Indicates pain   AROM (degrees) R/L (all movements include overpressure unless otherwise stated) Lumbar forward flexion (65): 25% loss Lumbar extension (30): 25% loss, centralization of symptoms with repeated extension  Lumbar lateral flexion (25): R: 50% loss* L: 25% loss Thoracic and Lumbar rotation (30 degrees):  R: 25% loss L: 25% loss Hip IR (0-45): R: 75% loss L: 75% loss Hip ER (0-45): R: WNL L: WNL Hip Flexion (0-125): WNL Hip extension (0-15): R: 50% loss L: 25% loss *Indicates pain    Muscle Length Hamstrings: R: 75 degrees L: 75 degrees  Ely: WNL Rashad: Deferred  Ober: Deferred   Passive Accessory Intervertebral Motion (PAIVM) Pt denies reproduction of back pain with CPA L1-L5 and UPA bilaterally L1-L5. Generally hypomobile throughout    SPECIAL TESTS Lumbar Radiculopathy and Discogenic: Centralization and Peripheralization (SN 92, -LR 0.12): Positive for slight decrease in L calf pain with repeated extension Slump (SN 83, -LR 0.32): R: Negative L: Positive SLR (SN 92, -LR 0.29): R: Negative L:  Negative Crossed SLR (SP 90): R: Negative L: Negative  Facet Joint: Extension-Rotation (SN 100, -LR 0.0): R: Negative L: Negative  Lumbar Spinal Stenosis: Lumbar quadrant (SN 70): R: Negative L: Negative  Hip: FABER (SN 81): R: Negative L: Negative FADIR (SN 94): R: Negative L: Negative Hip scour (SN 50): R: Negative L: Negative  SIJ:  Thigh Thrust (SN 88, -LR 0.18) : R:  Not examined L: Not examined  Piriformis Syndrome: FAIR Test (SN 88, SP 83): R: Negative L: Negative         Objective measurements completed on examination: See above findings.                 PT Short Term Goals - 06/27/20 1546      PT SHORT TERM GOAL #1   Title Pt will be independent with HEP in order to improve strength and decrease back pain in order to improve pain-free function at home and work.    Time 4    Period Weeks    Status New    Target Date 07/25/20      PT SHORT TERM GOAL #2   Title Pt will decrease worst back pain as reported on NPRS by at least 2 points in order to demonstrate clinically significant reduction in back pain.    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[employer] without an increase in  his pain    Time 8    Period Weeks    Status New    Target Date 08/22/20      PT LONG TERM GOAL #3   Title Pt will improve his FOTO score to at least 61 in order to demonstrate significant improvement in his function related to his back pain at home and work    Baseline 06/27/20: 43    Time 8    Period Weeks    Status New    Target Date 08/22/20      PT LONG TERM GOAL #4   Title Pt will decrease mODI scoreby at least 13 points in order demonstrate clinically significant reduction in pain/disability    Baseline 06/27/20: deferred to second session    Time 8    Period Weeks    Target Date 08/22/20      PT LONG TERM GOAL #5   Title Pt will report worst low back/LLE pain  as no more than 3/10 in order to demonstrate significant improvement in his low back pain    Baseline 06/27/20: worst: 9/10    Time 8    Period Weeks    Status New    Target Date 08/22/20                  Plan - 06/27/20 3338    Clinical Impression Statement Pt is a pleasant 60 year-old male referred for low back pain with left lower extremity radicular symptoms.  Positive slump test on the left with possible centralization during repeated lumbar extension.  Slight decrease in sensation along L3/4 dermatome however no focal weakness identified in left lower extremity.  Mildly antalgic gait on the left lower extremity.  No reproduction of pain with central or unilateral passive accessory motion testing of the lumbar spine.  Pt presents with deficits in pain and will benefit from skilled PT services to return to pain-free function at home and work.    Personal Factors and Comorbidities Comorbidity 3+;Comorbidity 2;Past/Current Experience;Time since onset of injury/illness/exacerbation    Comorbidities CAD, HTN    Examination-Activity Limitations Caring for Others;Sit;Stand    Examination-Participation Restrictions Cleaning;Community Activity;Meal Prep;Occupation;Shop    Stability/Clinical Decision Making Evolving/Moderate complexity    Clinical Decision Making Moderate    Rehab Potential Fair    PT Frequency 2x / week    PT Duration 8 weeks    PT Treatment/Interventions ADLs/Self Care Home Management;Aquatic Therapy;Biofeedback;Canalith Repostioning;Cryotherapy;Electrical Stimulation;Iontophoresis 4mg /ml Dexamethasone;Moist Heat;Traction;Ultrasound;DME Instruction;Gait training;Stair training;Functional mobility training;Therapeutic activities;Therapeutic exercise;Balance training;Neuromuscular re-education;Patient/family education;Manual techniques;Passive range of motion;Dry needling;Vestibular;Spinal Manipulations;Joint Manipulations    PT Next Visit Plan Review HEP, have pt complete  mODI, consider long axis distraction/traction    PT Home Exercise Plan Access Code: [email or web address]           Patient will benefit from skilled therapeutic intervention in order to improve the following deficits and impairments:  Abnormal gait,Pain  Visit Diagnosis: Chronic left-sided low back pain with left-sided sciatica - Plan: PT plan of care cert/re-cert  Muscle weakness (generalized) - Plan: PT plan of care cert/re-cert     Problem List Patient Active Problem List   Diagnosis Date Noted  . Sciatica, left side 12/31/2019  . Type A blood, Rh positive 10/30/2019  . Multiple pulmonary nodules determined by computed tomography of lung 10/09/2019  . Acute blood loss as cause of postoperative anemia 10/09/2019  . 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 S/P aortic valve replacement with bioprosthetic valve 09/14/2019  . Coronary artery  disease 09/14/2019  . CAD (coronary artery disease) 09/10/2019  . Prediabetes 09/10/2019  . Hyperlipidemia LDL goal <70 09/10/2019  . History of syncope 09/09/2019  . Nicotine dependence 09/09/2019  . HYPERTENSION, BENIGN 11/17/2009   Phillips Grout PT, DPT, GCS  Dean Obrien 06/27/2020, 3:58 PM  [facility] [address] [place], [place], [address] Phone: [phone]   Fax:  [phone]  Name: Dean Obrien MRN: [id number] Date of Birth: 08-Aug-1960

## 2020-06-29 ENCOUNTER — Other Ambulatory Visit: Payer: Self-pay

## 2020-06-29 ENCOUNTER — Ambulatory Visit

## 2020-06-29 DIAGNOSIS — M6281 Muscle weakness (generalized): Secondary | ICD-10-CM

## 2020-06-29 DIAGNOSIS — M5442 Lumbago with sciatica, left side: Secondary | ICD-10-CM | POA: Diagnosis not present

## 2020-06-29 DIAGNOSIS — G8929 Other chronic pain: Secondary | ICD-10-CM

## 2020-06-29 NOTE — Patient Instructions (Addendum)
Access Code: [email or web address] URL: [email or web address] Date: 06/29/2020 Prepared by: Roxana Hires  Exercises Prone on Elbows Stretch - 6 x daily - 7 x weekly - 2 minutes progressing to 5 minutes at a time hold Standing Lumbar Extension - 3 x daily - 7 x weekly - 2 sets - 10 reps - 5s hold Supine Anterior Pelvic Tilt - 2 x daily - 7 x weekly - 2 sets - 10 reps - 5s hold

## 2020-06-29 NOTE — Therapy (Signed)
[place] [address] [place], [place], [address] Phone: [phone]   Fax:  [phone]  Physical Therapy Treatment  Patient Details  Name: Dean Obrien MRN: [id number] Date of Birth: [birth year] Referring Provider (PT): Meeler, Whitney   Encounter Date: 06/29/2020   PT End of Session - 06/29/20 0956     Visit Number 2    Number of Visits 17    Date for PT Re-Evaluation 08/22/20    Authorization Type eval: 06/27/20    PT Start Time 0845    PT Stop Time 0930    PT Time Calculation (min) 45 min    Activity Tolerance Patient tolerated treatment well    Behavior During Therapy [facility] for tasks assessed/performed             Past Medical History:  Diagnosis Date   Aortic stenosis, severe 09/09/2018   RHC 09/10/19 Heavily calcified AV. VTI 0.42 cm. AV mean gradient 84.7 mmHg. Vmax measures 5.51 m/s. nl filling pressures, nl pulmonary pressure, nl CO.   CAD (coronary artery disease)    LHC 09/10/2019: pRCA 90%s, pLCx 20%s, mLAD 50%s   Critical aortic valve stenosis 7/59/1638   Diastolic dysfunction    4/66/5993   Hyperlipidemia LDL goal <70    8/19 LDL 87   Hypertension    NSTEMI (non-ST elevated myocardial infarction) ([place]) 09/09/2019   Prediabetes     Past Surgical History:  Procedure Laterality Date   AORTIC VALVE REPLACEMENT N/A 09/14/2019   Procedure: AORTIC VALVE REPLACEMENT (AVR) USING INSPIRIS VALVE SIZE 25MM;  Surgeon: Wonda Olds, MD;  Location: [place];  Service: Open Heart Surgery;  Laterality: N/A;   CORONARY ARTERY BYPASS GRAFT N/A 09/14/2019   Procedure: CORONARY ARTERY BYPASS GRAFTING (CABG) x Two using bilateral Internal mammary arteries;  Surgeon: Wonda Olds, MD;  [facility];  Service: Open Heart Surgery;  Laterality: N/A;   RIGHT/LEFT HEART CATH AND CORONARY ANGIOGRAPHY N/A 09/10/2019   Procedure: RIGHT/LEFT HEART CATH AND CORONARY ANGIOGRAPHY;  Surgeon: Wellington Hampshire, MD;  Location: [place] [facility];  Service: Cardiovascular;  Laterality: N/A;   TEE WITHOUT CARDIOVERSION N/A 09/14/2019   Procedure: TRANSESOPHAGEAL ECHOCARDIOGRAM (TEE);  Surgeon: Wonda Olds, MD;  Location: [place];  Service: Open Heart Surgery;  Laterality: N/A;    There were no vitals filed for this visit.   Subjective Assessment - 06/29/20 0846     Subjective Pt arrives reporting 3/10 low back pain currently. Standing extensions have been helping with his pain. He did not notice any improvement with prone positioning. He worked yesterday and by the end of his shift his pain had increased. No specific questions or concerns currently.    Pertinent History Pt arrives complaining of left-sided low back pain with radiating pain in the left buttock and into the left lateral and anterior lateral thigh extending to the anterior portion of the shin and the lateral calf. First episode of back pain was in 2015. No known trauma. Symptoms started as a sharp, intense (9/10) pain in his lower LLE. He saw his PCP who prescribed Gabapentin. At that time he also saw a chiropractor without any relief. He was sent for an injection and 2 weeks after the injection his pain resolved. Since that time he gets pain in his lower back a couple times/year. If he is careful the pain resolves within a few days/weeks. At the end of February he started to have the pain again however  it has continued to worsen. After a month he went to his PCP who referred him to physiatry. He had a spinal injection with mild (approximately 20%) relief of his pain. He was referred to neurosurgery and according to the patient he was told that he "probably needs surgery" but recommended physical therapy first. Pt cares for his wife who has MS and is wheelchair-bound. 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 He had a lumbar MRI which showed multilevel lumbar spondylosis. Findings are most pronounced at  L4-5 where there is a prominent left foraminal/extraforaminal disc herniation resulting in severe left foraminal stenosis and mild-moderate right foraminal stenosis. Correlate for radicular symptoms within the left L4 nerve root distribution. Moderate right and mild left foraminal stenosis at L3-4. Moderate left and mild right foraminal stenosis at L5-S1. No canal stenosis at any level. PMH includes CAD, hyperlipidemia, HTN, prediabetes.    Limitations Sitting;Walking;Standing    How long can you sit comfortably? 30 minutes    How long can you stand comfortably? 5 minutes    How long can you walk comfortably? 5 minutes    Diagnostic tests see history    Patient Stated Goals "I need to feel that the pain is decreasing" Pt would like to be active for >1 hour without stopping due to pain.    Currently in Pain? Yes    Pain Score 3     Pain Location Back    Pain Orientation Left;Lower    Pain Descriptors / Indicators Sharp    Pain Type Chronic pain    Pain Onset More than a month ago    Pain Frequency Constant                TREATMENT   Manual Therapy  Pt completed mODI: 46% MHP to lower lumbar spine in prone positioning to improve extension; Prone L3-L5 CPA, grade II-III, 30s/bout x 2 bouts/level; Prone on elbows L3-L5 CPA, grade II-III, 30s/bout x 1 bout/level; Prone L3-L5 L UPA, on elbows, 30s/bout x 2 bouts/level; R sidelying lumbar rotation mobilizations, grade III, 30s/bout x 2 bouts; R sidelying gapping mobilization with pillow under side, grade III, 30s/bout x 2 bouts; Supine anterior pelvic tilts with 5s hold x 10, attempted posterior pelvic tilts however it is too painful; Issued HEP;   Trigger Point Dry Needling (TDN), unbilled Education performed with patient regarding potential benefit of TDN. Reviewed precautions and risks with patient. Extensive time spent with pt to ensure full understanding of TDN risks. Pt provided verbal consent to treatment. In prone using  clean technique TDN performed to L lumbar multifidi (L4 and L5) with 2, 0.30 x 75 single needle placements with deep ache reported. Pistoning technique utilized.    Pt educated throughout session about proper posture and technique with exercises. Improved exercise technique, movement at target joints, use of target muscles after min to mod verbal, visual, tactile cues.    Pt demonstrates excellent motivation during session today. Introduced manual techniques for low back as well as trigger point dry needling.  Pt issued HEP today during session and encouraged follow-up as scheduled. Will assess response to treatment session at next visit and adjust accordingly. Pt will benefit from PT services to address deficits in strength and pain in order to return to full function at home and work.                  PT  Short Term Goals - 06/27/20 1546       PT SHORT TERM GOAL #1   Title Pt will be independent with HEP in order to improve strength and decrease back pain in order to improve pain-free function at home and work.    Time 4    Period Weeks    Status New    Target Date 07/25/20      PT SHORT TERM GOAL #2   Title Pt will decrease worst back pain as reported on NPRS by at least 2 points in order to demonstrate clinically significant reduction in back pain.    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    Time 8    Period Weeks    Status New    Target Date  08/22/20      PT LONG TERM GOAL #3   Title Pt will improve his FOTO score to at least 61 in order to demonstrate significant improvement in his function related to his back pain at home and work    Baseline 06/27/20: 43    Time 8    Period Weeks    Status New    Target Date 08/22/20      PT LONG TERM GOAL #4   Title Pt will decrease mODI scoreby at least 13 points in order demonstrate clinically significant reduction in pain/disability    Baseline 06/27/20: deferred to second session; 06/29/20: 46%    Time 8    Period Weeks    Target Date 08/22/20      PT LONG TERM GOAL #5   Title Pt will report worst low back/LLE pain as no more than 3/10 in order to demonstrate significant improvement in his low back pain    Baseline 06/27/20: worst: 9/10    Time 8    Period Weeks    Status New    Target Date 08/22/20                   Plan - 06/29/20 0858     Clinical Impression Statement Pt demonstrates excellent motivation during session today. Introduced manual techniques for low back as well as trigger point dry needling.  Pt issued HEP today during session and encouraged follow-up as scheduled. Will assess response to treatment session at next visit and adjust accordingly. Pt will benefit from PT services to address deficits in strength and pain in order to return to full function at home and work.    Personal Factors and Comorbidities Comorbidity 3+;Comorbidity 2;Past/Current Experience;Time since onset of injury/illness/exacerbation    Comorbidities CAD, HTN    Examination-Activity Limitations Caring for Others;Sit;Stand    Examination-Participation Restrictions Cleaning;Community Activity;Meal Prep;Occupation;Shop  Stability/Clinical Decision Making Evolving/Moderate complexity    Rehab Potential Fair    PT Frequency 2x / week    PT Duration 8 weeks    PT Treatment/Interventions ADLs/Self Care Home Management;Aquatic Therapy;Biofeedback;Canalith Repostioning;Cryotherapy;Electrical  Stimulation;Iontophoresis 4mg /ml Dexamethasone;Moist Heat;Traction;Ultrasound;DME Instruction;Gait training;Stair training;Functional mobility training;Therapeutic activities;Therapeutic exercise;Balance training;Neuromuscular re-education;Patient/family education;Manual techniques;Passive range of motion;Dry needling;Vestibular;Spinal Manipulations;Joint Manipulations    PT Next Visit Plan Review HEP, have pt complete mODI, consider long axis distraction/traction    PT Home Exercise Plan Access Code: [email or web address]             Patient will benefit from skilled therapeutic intervention in order to improve the following deficits and impairments:  Abnormal gait, Pain  Visit Diagnosis: Chronic left-sided low back pain with left-sided sciatica  Muscle weakness (generalized)     Problem List Patient Active Problem List   Diagnosis Date Noted   Sciatica, left side 12/31/2019   Type A blood, Rh positive 10/30/2019   Multiple pulmonary nodules determined by computed tomography of lung 10/09/2019   Acute blood loss as cause of postoperative anemia 10/09/2019   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 [place], [place], [address] Phone: [phone]   Fax:  [phone]  Name: Bradin Mcadory MRN: [id number] Date of Birth: 01-17-1961

## 2020-06-30 ENCOUNTER — Ambulatory Visit: Admitting: Internal Medicine

## 2020-07-04 ENCOUNTER — Other Ambulatory Visit: Payer: Self-pay

## 2020-07-04 ENCOUNTER — Ambulatory Visit

## 2020-07-04 DIAGNOSIS — M5442 Lumbago with sciatica, left side: Secondary | ICD-10-CM | POA: Diagnosis not present

## 2020-07-04 DIAGNOSIS — G8929 Other chronic pain: Secondary | ICD-10-CM

## 2020-07-04 DIAGNOSIS — M6281 Muscle weakness (generalized): Secondary | ICD-10-CM

## 2020-07-04 NOTE — Therapy (Signed)
[place] [facility] [address] [place], [place], [address] Phone: [phone]   Fax:  [phone]  Physical Therapy Treatment  Patient Details  Name: Dean Obrien MRN: [id number] Date of Birth: 02-13-1960 Referring Provider (PT): Meeler, Whitney   Encounter Date: 07/04/2020   PT End of Session - 07/04/20 0941     Visit Number 3    Number of Visits 17    Date for PT Re-Evaluation 08/22/20    Authorization Type eval: 06/27/20    PT Start Time 0931    PT Stop Time 1015    PT Time Calculation (min) 44 min    Activity Tolerance Patient tolerated treatment well    Behavior During Therapy [facility] for tasks assessed/performed             Past Medical History:  Diagnosis Date   Aortic stenosis, severe 09/09/2018   RHC 09/10/19 Heavily calcified AV. VTI 0.42 cm. AV mean gradient 84.7 mmHg. Vmax measures 5.51 m/s. nl filling pressures, nl pulmonary pressure, nl CO.   CAD (coronary artery disease)    LHC 09/10/2019: pRCA 90%s, pLCx 20%s, mLAD 50%s   Critical aortic valve stenosis 8/67/6195   Diastolic dysfunction    0/93/2671   Hyperlipidemia LDL goal <70    8/19 LDL 87   Hypertension    NSTEMI (non-ST elevated myocardial infarction) ([place]) 09/09/2019   Prediabetes     Past Surgical History:  Procedure Laterality Date   AORTIC VALVE REPLACEMENT N/A 09/14/2019   Procedure: AORTIC VALVE REPLACEMENT (AVR) USING INSPIRIS VALVE SIZE 25MM;  Surgeon: Wonda Olds, MD;  Location: [place];  Service: Open Heart Surgery;  Laterality: N/A;   CORONARY ARTERY BYPASS GRAFT N/A 09/14/2019   Procedure: CORONARY ARTERY BYPASS GRAFTING (CABG) x Two using bilateral Internal mammary arteries;  Surgeon: Wonda Olds, MD;  [facility];  Service: Open Heart Surgery;  Laterality: N/A;   RIGHT/LEFT HEART CATH AND CORONARY ANGIOGRAPHY N/A 09/10/2019   Procedure: RIGHT/LEFT HEART CATH AND CORONARY ANGIOGRAPHY;  Surgeon: Wellington Hampshire, MD;  Location: [place] [facility];  Service: Cardiovascular;  Laterality: N/A;   TEE WITHOUT CARDIOVERSION N/A 09/14/2019   Procedure: TRANSESOPHAGEAL ECHOCARDIOGRAM (TEE);  Surgeon: Wonda Olds, MD;  Location: [place];  Service: Open Heart Surgery;  Laterality: N/A;    There were no vitals filed for this visit.   Subjective Assessment - 07/04/20 0937     Subjective Pt arrives reporting 2/10 low back pain currently. Standing extensions continue to help with his pain; states extension helps more than anything else. He did not notice anysignificant changes after last sessions dry needling. He states pain is worst after prolonged standing and walking. He reports radiating pain to LLE has improved. No specific questions or concerns currently.    Pertinent History Pt arrives complaining of left-sided low back pain with radiating pain in the left buttock and into the left lateral and anterior lateral thigh extending to the anterior portion of the shin and the lateral calf. First episode of back pain was in 2015. No known trauma. Symptoms started as a sharp, intense (9/10) pain in his lower LLE. He saw his PCP who prescribed Gabapentin. At that time he also saw a chiropractor without any relief. He was sent for an injection and 2 weeks after the injection his pain resolved. Since that time he gets pain in his lower back a couple times/year. If he is careful the pain resolves within a few days/weeks.  At the end of February he started to have the pain again however it has continued to worsen. After a month he went to his PCP who referred him to physiatry. He had a spinal injection with mild (approximately 20%) relief of his pain. He was referred to neurosurgery and according to the patient he was told that he "probably needs surgery" but recommended physical therapy first. Pt cares for his wife who has MS and is wheelchair-bound. 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 He had a lumbar MRI which showed multilevel lumbar spondylosis. Findings are most pronounced at L4-5 where there is a prominent left foraminal/extraforaminal disc herniation resulting in severe left foraminal stenosis and mild-moderate right foraminal stenosis. Correlate for radicular symptoms within the left L4 nerve root distribution. Moderate right and mild left foraminal stenosis at L3-4. Moderate left and mild right foraminal stenosis at L5-S1. No canal stenosis at any level. PMH includes CAD, hyperlipidemia, HTN, prediabetes.    Limitations Sitting;Walking;Standing    How long can you sit comfortably? 30 minutes    How long can you stand comfortably? 5 minutes    How long can you walk comfortably? 5 minutes    Diagnostic tests see history    Patient Stated Goals "I need to feel that the pain is decreasing" Pt would like to be active for >1 hour without stopping due to pain.    Pain Score 2     Pain Location Back    Pain Orientation Lower    Pain Type Chronic pain    Pain Onset More than a month ago                   TREATMENT    Therapeutic Exercise NuStep L1-3 5 minutes during history (3 minutes unbilled); Prone alternating hip extension x 10 each side, pt denies increase in pain;    Manual Therapy  MHP to lower lumbar spine in prone positioning to improve extension; Prone on elbows L3-L5 CPA, grade II-III, 30s/bout x 1 bout/level; Prone L3-L5 L UPA, on elbows, 30s/bout x 2 bouts/level; R sidelying lumbar rotation mobilizations, grade III, 30s/bout x 2 bouts; R sidelying gapping mobilization with pillow under side, grade III, 30s/bout x 2 bouts; Supine anterior pelvic tilts with 5s hold x 10; Prone press ups with overpressure provided by therapist followed by oscillations in extended position at L4-5 x 10;     Trigger Point Dry Needling (TDN), unbilled Education performed with patient regarding potential benefit of TDN. Reviewed precautions and risks with  patient. Extensive time spent with pt to ensure full understanding of TDN risks. Pt provided verbal consent to treatment. In prone using clean technique TDN performed to bilateral lumbar multifidi (L4 and L5) with 4, 0.30 x 75 single needle placements with deep ache reported (2 on each side). Pistoning technique utilized.      Pt educated throughout session about proper posture and technique with exercises. Improved exercise technique, movement at target joints, use of target muscles after min to mod verbal, visual, tactile cues.      Pt demonstrates excellent motivation during session today. Continued manual techniques for low back as well as trigger point dry needling. Will assess response to treatment at next visit and adjust accordingly. Overall pt is making good progress toward decrease in pain and improved function at home, work, and while caring for wife. Pt will benefit from PT services to  address deficits in strength and pain in order to return to full function at home and work.                     PT Short Term Goals - 06/27/20 1546       PT SHORT TERM GOAL #1   Title Pt will be independent with HEP in order to improve strength and decrease back pain in order to improve pain-free function at home and work.    Time 4    Period Weeks    Status New    Target Date 07/25/20      PT SHORT TERM GOAL #2   Title Pt will decrease worst back pain as reported on NPRS by at least 2 points in order to demonstrate clinically significant reduction in back pain.    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    Time 8    Period Weeks    Status New    Target Date 08/22/20      PT LONG TERM GOAL #3   Title Pt will improve his FOTO score to at least 61 in order to demonstrate significant improvement in his function related to his back pain at home and work    Baseline 06/27/20: 43    Time 8    Period Weeks    Status New    Target Date 08/22/20      PT LONG TERM GOAL #4   Title Pt will decrease mODI scoreby at least 13 points in order demonstrate clinically significant reduction in pain/disability    Baseline 06/27/20: deferred to second session; 06/29/20: 46%    Time 8    Period Weeks    Target Date 08/22/20      PT LONG TERM GOAL #5   Title Pt will report worst low back/LLE pain as no more than 3/10 in order to demonstrate significant improvement in his low back pain    Baseline 06/27/20: worst: 9/10    Time 8    Period Weeks    Status New    Target Date 08/22/20                   Plan - 07/04/20 0959     Clinical Impression Statement Pt demonstrates excellent motivation during session today. Continued manual techniques for low back as well as trigger point dry needling. Will assess response to treatment at next visit and adjust accordingly. Overall pt is making good progress toward decrease in pain and improved function at home, work, and while caring for wife. Pt will benefit from PT services to address deficits in strength and pain in order  to return to full function at home and work.    Personal Factors and Comorbidities Comorbidity 3+;Comorbidity 2;Past/Current Experience;Time since onset of injury/illness/exacerbation    Comorbidities CAD, HTN    Examination-Activity Limitations Caring for Others;Sit;Stand    Examination-Participation Restrictions Cleaning;Community Activity;Meal Prep;Occupation;Shop    Stability/Clinical  Decision Making Evolving/Moderate complexity    Rehab Potential Fair    PT Frequency 2x / week    PT Duration 8 weeks    PT Treatment/Interventions ADLs/Self Care Home Management;Aquatic Therapy;Biofeedback;Canalith Repostioning;Cryotherapy;Electrical Stimulation;Iontophoresis 4mg /ml Dexamethasone;Moist Heat;Traction;Ultrasound;DME Instruction;Gait training;Stair training;Functional mobility training;Therapeutic activities;Therapeutic exercise;Balance training;Neuromuscular re-education;Patient/family education;Manual techniques;Passive range of motion;Dry needling;Vestibular;Spinal Manipulations;Joint Manipulations    PT Next Visit Plan Review HEP, consider long axis distraction/traction    PT Home Exercise Plan Access Code: [email or web address]             Patient will benefit from skilled therapeutic intervention in order to improve the following deficits and impairments:  Abnormal gait, Pain  Visit Diagnosis: Chronic left-sided low back pain with left-sided sciatica  Muscle weakness (generalized)     Problem List Patient Active Problem List   Diagnosis Date Noted   Sciatica, left side 12/31/2019   Type A blood, Rh positive 10/30/2019   Multiple pulmonary nodules determined by computed tomography of lung 10/09/2019   Acute blood loss as cause of postoperative anemia 10/09/2019   S/P CABG x 2 09/14/2019   S/P aortic valve replacement with bioprosthetic valve 09/14/2019   Coronary artery disease 09/14/2019   CAD (coronary artery disease) 09/10/2019   Prediabetes 09/10/2019   Hyperlipidemia LDL goal <70 09/10/2019   History of syncope 09/09/2019   Nicotine dependence 09/09/2019   HYPERTENSION, BENIGN 11/17/2009   [place]  Claron Rosencrans 07/04/2020, 12:01 PM  [place] [facility] [address] [place], [place], [address] Phone: [phone]   Fax:  [phone]  Name: Brydon Spahr MRN: [id number] Date of Birth: 07-Mar-1960

## 2020-07-05 ENCOUNTER — Ambulatory Visit: Admitting: Internal Medicine

## 2020-07-05 ENCOUNTER — Encounter: Payer: Self-pay | Admitting: Internal Medicine

## 2020-07-05 ENCOUNTER — Other Ambulatory Visit: Payer: Self-pay

## 2020-07-05 VITALS — BP 142/80 | HR 60 | Temp 96.8°F | Resp 15 | Ht 63.0 in | Wt 154.4 lb

## 2020-07-05 DIAGNOSIS — I10 Essential (primary) hypertension: Secondary | ICD-10-CM

## 2020-07-05 DIAGNOSIS — D62 Acute posthemorrhagic anemia: Secondary | ICD-10-CM

## 2020-07-05 DIAGNOSIS — D171 Benign lipomatous neoplasm of skin and subcutaneous tissue of trunk: Secondary | ICD-10-CM

## 2020-07-05 DIAGNOSIS — M5432 Sciatica, left side: Secondary | ICD-10-CM

## 2020-07-05 DIAGNOSIS — R7303 Prediabetes: Secondary | ICD-10-CM

## 2020-07-05 LAB — COMPREHENSIVE METABOLIC PANEL
ALT: 23 U/L (ref 0–53)
AST: 15 U/L (ref 0–37)
Albumin: 4.4 g/dL (ref 3.5–5.2)
Alkaline Phosphatase: 57 U/L (ref 39–117)
BUN: 18 mg/dL (ref 6–23)
CO2: 27 mEq/L (ref 19–32)
Calcium: 9.5 mg/dL (ref 8.4–10.5)
Chloride: 102 mEq/L (ref 96–112)
Creatinine, Ser: 0.71 mg/dL (ref 0.40–1.50)
GFR: 100.83 mL/min (ref >60.00)
Glucose, Bld: 115 mg/dL — ABNORMAL HIGH (ref 70–99)
Potassium: 4.1 mEq/L (ref 3.5–5.1)
Sodium: 138 mEq/L (ref 135–145)
Total Bilirubin: 0.8 mg/dL (ref 0.2–1.2)
Total Protein: 7.1 g/dL (ref 6.0–8.3)

## 2020-07-05 NOTE — Progress Notes (Signed)
Subjective:  Patient ID: Dean Obrien, male    DOB: 14-Dec-1960  Age: 60 y.o. MRN: [id number]  CC: The primary encounter diagnosis was HYPERTENSION, BENIGN. Diagnoses of Lipoma of buttock, Prediabetes, Acute blood loss as cause of postoperative anemia, Sciatica, left side, and Lipoma of torso were also pertinent to this visit.  HPI Dean Obrien presents for follow up on hypertension, hyperlipidemia,  back pain and CAD   Hypertension: patient checks blood pressure twice weekly at home.  Readings have been for the most part < 140/80 at rest . Patient is following a reduced salt diet most days and is taking medications as prescribed (telmisartan, metoprolol).  He denies any recent episodes of chest pain and is tolerating Plavix and statin without myalgias or bleeding   He continues to have low back pain with intermittent radiculopathy , managed with tyenol during he day and Vicodin at night   Large lipoma on l right buttock . Becoming uncomfortable to sit   gen surg referral discussed   Outpatient Medications Prior to Visit  Medication Sig Dispense Refill   aspirin EC 81 MG EC tablet Take 1 tablet (81 mg total) by mouth daily. Swallow whole. 30 tablet 11   atorvastatin (LIPITOR) 40 MG tablet Take 1 tablet (40 mg total) by mouth daily. 90 tablet 3   clopidogrel (PLAVIX) 75 MG tablet TAKE 1 TABLET(75 MG) BY MOUTH DAILY 90 tablet 3   fluticasone (FLONASE) 50 MCG/ACT nasal spray SHAKE LIQUID AND USE 2 SPRAYS IN EACH NOSTRIL DAILY 48 g 3   gabapentin (NEURONTIN) 300 MG capsule Take 300 mg by mouth 3 (three) times daily.     HYDROcodone-acetaminophen (NORCO) 10-325 MG tablet Take 1 tablet by mouth every 6 (six) hours as needed. 30 tablet 0   metoprolol tartrate (LOPRESSOR) 25 MG tablet TAKE 1 TABLET(25 MG) BY MOUTH TWICE DAILY 180 tablet 3   ondansetron (ZOFRAN ODT) 4 MG disintegrating tablet Take 1 tablet (4 mg total) by mouth every 8 (eight) hours as needed for nausea or vomiting. 20 tablet 0    telmisartan (MICARDIS) 20 MG tablet TAKE 1 TABLET(20 MG) BY MOUTH AT BEDTIME 90 tablet 3   traZODone (DESYREL) 50 MG tablet Take 0.5-1 tablets (25-50 mg total) by mouth at bedtime as needed for sleep. 90 tablet 3   tizanidine (ZANAFLEX) 2 MG capsule Take 1 capsule (2 mg total) by mouth 3 (three) times daily as needed for muscle spasms. (Patient not taking: Reported on 07/05/2020) 60 capsule 1   No facility-administered medications prior to visit.    Review of Systems;  Patient denies headache, fevers, malaise, unintentional weight loss, skin rash, eye pain, sinus congestion and sinus pain, sore throat, dysphagia,  hemoptysis , cough, dyspnea, wheezing, chest pain, palpitations, orthopnea, edema, abdominal pain, nausea, melena, diarrhea, constipation, flank pain, dysuria, hematuria, urinary  Frequency, nocturia, numbness, tingling, seizures,  Focal weakness, Loss of consciousness,  Tremor, insomnia, depression, anxiety, and suicidal ideation.      Objective:  BP (!) 142/80 (BP Location: Left Arm, Patient Position: Sitting, Cuff Size: Normal)   Pulse 60   Temp (!) 96.8 F (36 C) (Temporal)   Resp 15   Ht 5\' 3"  (1.6 m)   Wt 154 lb 6.4 oz (70 kg)   SpO2 98%   BMI 27.35 kg/m   BP Readings from Last 3 Encounters:  07/05/20 (!) 142/80  04/28/20 130/82  04/10/20 134/83    Wt Readings from Last 3 Encounters:  07/05/20 154 lb 6.4 oz (  70 kg)  04/28/20 150 lb 6 oz (68.2 kg)  04/10/20 144 lb (65.3 kg)    General appearance: alert, cooperative and appears stated age Ears: normal TM's and external ear canals both ears Throat: lips, mucosa, and tongue normal; teeth and gums normal Neck: no adenopathy, no carotid bruit, supple, symmetrical, trachea midline and thyroid not enlarged, symmetric, no tenderness/mass/nodules Back: symmetric, no curvature. ROM normal. No CVA tenderness. Lungs: clear to auscultation bilaterally Heart: regular rate and rhythm, S1, S2 normal, no murmur, click, rub  or gallop Abdomen: soft, non-tender; bowel sounds normal; no masses,  no organomegaly Pulses: 2+ and symmetric Skin: Skin color, texture, turgor normal. No rashes or lesions. LARGE TENNIS BALL SIZED LIPOMA ON RIGHT BUTTOCK  Lymph nodes: Cervical, supraclavicular, and axillary nodes normal.  Lab Results  Component Value Date   HGBA1C 5.8 (H) 09/13/2019   HGBA1C 5.8 (H) 09/09/2019    Lab Results  Component Value Date   CREATININE 0.71 07/05/2020   CREATININE 0.64 12/31/2019   CREATININE 0.70 10/22/2019    Lab Results  Component Value Date   WBC 9.6 12/31/2019   HGB 13.9 12/31/2019   HCT 41.8 12/31/2019   PLT 193.0 12/31/2019   GLUCOSE 115 (H) 07/05/2020   CHOL 90 (L) 11/09/2019   TRIG 46 11/09/2019   HDL 47 11/09/2019   LDLCALC 31 11/09/2019   ALT 23 07/05/2020   AST 15 07/05/2020   NA 138 07/05/2020   K 4.1 07/05/2020   CL 102 07/05/2020   CREATININE 0.71 07/05/2020   BUN 18 07/05/2020   CO2 27 07/05/2020   TSH 1.224 09/09/2019   INR 1.7 (H) 09/14/2019   HGBA1C 5.8 (H) 09/13/2019   MICROALBUR <0.7 12/31/2019    CT Chest Wo Contrast  Result Date: 04/26/2020 CLINICAL DATA:  Lung nodule. EXAM: CT CHEST WITHOUT CONTRAST TECHNIQUE: Multidetector CT imaging of the chest was performed following the standard protocol without IV contrast. COMPARISON:  09/12/2019. FINDINGS: Cardiovascular: Atherosclerotic calcification of the aorta. Aortic valve replacement. Heart size normal. No pericardial effusion. Mediastinum/Nodes: Mediastinal lymph nodes are not enlarged by CT size criteria. Hilar regions are difficult to definitively evaluate without IV contrast. No axillary adenopathy. Subcutaneous sebaceous cyst overlies the right deltoid musculature. Esophagus is grossly unremarkable. Lungs/Pleura: Centrilobular and paraseptal emphysema. Residual smoking related respiratory bronchiolitis. Vague 5 mm nodule in the apical left upper lobe (3/28), similar to minimally smaller than on  09/12/2019, at which time it measured approximately 7 mm. Other millimetric pulmonary nodules are unchanged. No pleural fluid. Airway is unremarkable. Upper Abdomen: Visualized portions of the liver and gallbladder are unremarkable. Biliary ductal dilatation appears grossly similar. Visualized portions of the adrenal glands, kidneys, spleen, pancreas, stomach and bowel are grossly unremarkable. No upper abdominal adenopathy. Musculoskeletal: Degenerative changes in the spine. No worrisome lytic or sclerotic lesions. Old rib fractures. IMPRESSION: 1. Scattered small pulmonary nodules are unchanged or minimally decreased in size from 09/12/2019. Consider ongoing evaluation with annual low-dose lung cancer screening CT. 2.  Aortic atherosclerosis (ICD10-I70.0). 3.  Emphysema (ICD10-J43.9). Electronically Signed   By: Lorin Picket M.D.   On: 04/26/2020 09:04    Assessment & Plan:   Problem List Items Addressed This Visit       Unprioritized   HYPERTENSION, BENIGN - Primary   Relevant Orders   Comprehensive metabolic panel (Completed)   Prediabetes    He has total revised his diet and has lost 12 lbs.   Repeat a1c is needed  Lab Results  Component  Value Date   HGBA1C 5.8 (H) 09/13/2019          Relevant Orders   Hemoglobin A1c   Acute blood loss as cause of postoperative anemia    Resolved by December labs        Sciatica, left side    MRI repeated notes nerve root impingement at L5 on the left . He has deferred surgery and is using gabapentin tid and qhs vicodin for severe pain        Lipoma    Right buttock.  Refer to gen surg for excision        Other Visit Diagnoses     Lipoma of buttock       Relevant Orders   Ambulatory referral to General Surgery       I have discontinued Terrence Dupont "Tom"'s tizanidine. I am also having him maintain his aspirin, traZODone, fluticasone, gabapentin, atorvastatin, clopidogrel, metoprolol tartrate, telmisartan, ondansetron, and  HYDROcodone-acetaminophen.  No orders of the defined types were placed in this encounter.   Medications Discontinued During This Encounter  Medication Reason   tizanidine (ZANAFLEX) 2 MG capsule     Follow-up: Return in about 6 months (around 01/04/2021).   Crecencio Mc, MD   Subjective:  Patient ID: Vaun Hyndman, male    DOB: [birth year]  Age: 60 y.o. MRN: [id number]  CC: The primary encounter diagnosis was HYPERTENSION, BENIGN. Diagnoses of Lipoma of buttock, Prediabetes, Acute blood loss as cause of postoperative anemia, Sciatica, left side, and Lipoma of torso were also pertinent to this visit.  HPI Daylen Lipsky presents for follow up on hyperlipidemia, aortic atherosclerosis, hypertension and prediabetes  This visit occurred during the SARS-CoV-2 public health emergency.  Safety protocols were in place, including screening questions prior to the visit, additional usage of staff PPE, and extensive cleaning of exam room while observing appropriate contact time as indicated for disinfecting solutions.    He feels generally well but continues to suffer from low back pain:  getting PT ,  visit 4 was yesterday; seems to be helping . L5 nerve root since 2015.  Periodically aggravated,  longer to resolve each time.  Neurosurgery   [place] recommended PT first for a month.  The pain Radiates to left leg.  ESI helped somewhat, just lowered his pain scale.   Works 5 hours daily , walks 7 miles per fitbit  . Wife Dani Gobble is a quadriplegic due to MS. Has to pull her and roll her over on a regular basis . Rationing his vicodin to bad days  takes with zofran to avoid the nausea .  Gabapentin taking regularly .    Insomnia:  trazodone helping him fall asleep. But wakes up after 3 hours.    Trouble falling back asleep  due in part to back pain   Lipoma on right buttock getting larger .  PT noticed it   right buttock.  Tennis ball sized.   Wants Gen Surgery   HTN:  home reading 125/72  on  telmisartan and metoprolol.      Outpatient Medications Prior to Visit  Medication Sig Dispense Refill   aspirin EC 81 MG EC tablet Take 1 tablet (81 mg total) by mouth daily. Swallow whole. 30 tablet 11   atorvastatin (LIPITOR) 40 MG tablet Take 1 tablet (40 mg total) by mouth daily. 90 tablet 3   clopidogrel (PLAVIX) 75 MG tablet TAKE 1 TABLET(75 MG) BY MOUTH DAILY 90 tablet 3   fluticasone (FLONASE) 50 MCG/ACT  nasal spray SHAKE LIQUID AND USE 2 SPRAYS IN EACH NOSTRIL DAILY 48 g 3   gabapentin (NEURONTIN) 300 MG capsule Take 300 mg by mouth 3 (three) times daily.     HYDROcodone-acetaminophen (NORCO) 10-325 MG tablet Take 1 tablet by mouth every 6 (six) hours as needed. 30 tablet 0   metoprolol tartrate (LOPRESSOR) 25 MG tablet TAKE 1 TABLET(25 MG) BY MOUTH TWICE DAILY 180 tablet 3   ondansetron (ZOFRAN ODT) 4 MG disintegrating tablet Take 1 tablet (4 mg total) by mouth every 8 (eight) hours as needed for nausea or vomiting. 20 tablet 0   telmisartan (MICARDIS) 20 MG tablet TAKE 1 TABLET(20 MG) BY MOUTH AT BEDTIME 90 tablet 3   traZODone (DESYREL) 50 MG tablet Take 0.5-1 tablets (25-50 mg total) by mouth at bedtime as needed for sleep. 90 tablet 3   tizanidine (ZANAFLEX) 2 MG capsule Take 1 capsule (2 mg total) by mouth 3 (three) times daily as needed for muscle spasms. (Patient not taking: Reported on 07/05/2020) 60 capsule 1   No facility-administered medications prior to visit.    Review of Systems;  Patient denies headache, fevers, malaise, unintentional weight loss, skin rash, eye pain, sinus congestion and sinus pain, sore throat, dysphagia,  hemoptysis , cough, dyspnea, wheezing, chest pain, palpitations, orthopnea, edema, abdominal pain, nausea, melena, diarrhea, constipation, flank pain, dysuria, hematuria, urinary  Frequency, nocturia, numbness, tingling, seizures,  Focal weakness, Loss of consciousness,  Tremor, insomnia, depression, anxiety, and suicidal ideation.       Objective:  BP (!) 142/80 (BP Location: Left Arm, Patient Position: Sitting, Cuff Size: Normal)   Pulse 60   Temp (!) 96.8 F (36 C) (Temporal)   Resp 15   Ht 5\' 3"  (1.6 m)   Wt 154 lb 6.4 oz (70 kg)   SpO2 98%   BMI 27.35 kg/m   BP Readings from Last 3 Encounters:  07/05/20 (!) 142/80  04/28/20 130/82  04/10/20 134/83    Wt Readings from Last 3 Encounters:  07/05/20 154 lb 6.4 oz (70 kg)  04/28/20 150 lb 6 oz (68.2 kg)  04/10/20 144 lb (65.3 kg)    General appearance: alert, cooperative and appears stated age Ears: normal TM's and external ear canals both ears Throat: lips, mucosa, and tongue normal; teeth and gums normal Neck: no adenopathy, no carotid bruit, supple, symmetrical, trachea midline and thyroid not enlarged, symmetric, no tenderness/mass/nodules Back: symmetric, no curvature. ROM normal. No CVA tenderness. Lungs: clear to auscultation bilaterally Heart: regular rate and rhythm, S1, S2 normal, no murmur, click, rub or gallop Abdomen: soft, non-tender; bowel sounds normal; no masses,  no organomegaly Pulses: 2+ and symmetric Skin: Skin color, texture, turgor normal. No rashes or lesions Lymph nodes: Cervical, supraclavicular, and axillary nodes normal.  Lab Results  Component Value Date   HGBA1C 5.8 (H) 09/13/2019   HGBA1C 5.8 (H) 09/09/2019    Lab Results  Component Value Date   CREATININE 0.71 07/05/2020   CREATININE 0.64 12/31/2019   CREATININE 0.70 10/22/2019    Lab Results  Component Value Date   WBC 9.6 12/31/2019   HGB 13.9 12/31/2019   HCT 41.8 12/31/2019   PLT 193.0 12/31/2019   GLUCOSE 115 (H) 07/05/2020   CHOL 90 (L) 11/09/2019   TRIG 46 11/09/2019   HDL 47 11/09/2019   LDLCALC 31 11/09/2019   ALT 23 07/05/2020   AST 15 07/05/2020   NA 138 07/05/2020   K 4.1 07/05/2020   CL 102 07/05/2020  CREATININE 0.71 07/05/2020   BUN 18 07/05/2020   CO2 27 07/05/2020   TSH 1.224 09/09/2019   INR 1.7 (H) 09/14/2019   HGBA1C 5.8  (H) 09/13/2019   MICROALBUR <0.7 12/31/2019    CT Chest Wo Contrast  Result Date: 04/26/2020 CLINICAL DATA:  Lung nodule. EXAM: CT CHEST WITHOUT CONTRAST TECHNIQUE: Multidetector CT imaging of the chest was performed following the standard protocol without IV contrast. COMPARISON:  09/12/2019. FINDINGS: Cardiovascular: Atherosclerotic calcification of the aorta. Aortic valve replacement. Heart size normal. No pericardial effusion. Mediastinum/Nodes: Mediastinal lymph nodes are not enlarged by CT size criteria. Hilar regions are difficult to definitively evaluate without IV contrast. No axillary adenopathy. Subcutaneous sebaceous cyst overlies the right deltoid musculature. Esophagus is grossly unremarkable. Lungs/Pleura: Centrilobular and paraseptal emphysema. Residual smoking related respiratory bronchiolitis. Vague 5 mm nodule in the apical left upper lobe (3/28), similar to minimally smaller than on 09/12/2019, at which time it measured approximately 7 mm. Other millimetric pulmonary nodules are unchanged. No pleural fluid. Airway is unremarkable. Upper Abdomen: Visualized portions of the liver and gallbladder are unremarkable. Biliary ductal dilatation appears grossly similar. Visualized portions of the adrenal glands, kidneys, spleen, pancreas, stomach and bowel are grossly unremarkable. No upper abdominal adenopathy. Musculoskeletal: Degenerative changes in the spine. No worrisome lytic or sclerotic lesions. Old rib fractures. IMPRESSION: 1. Scattered small pulmonary nodules are unchanged or minimally decreased in size from 09/12/2019. Consider ongoing evaluation with annual low-dose lung cancer screening CT. 2.  Aortic atherosclerosis (ICD10-I70.0). 3.  Emphysema (ICD10-J43.9). Electronically Signed   By: Lorin Picket M.D.   On: 04/26/2020 09:04    Assessment & Plan:   Problem List Items Addressed This Visit       Unprioritized   HYPERTENSION, BENIGN - Primary   Relevant Orders    Comprehensive metabolic panel (Completed)   Prediabetes    He has total revised his diet and has lost 12 lbs.   Repeat a1c is needed  Lab Results  Component Value Date   HGBA1C 5.8 (H) 09/13/2019          Relevant Orders   Hemoglobin A1c   Acute blood loss as cause of postoperative anemia    Resolved by December labs        Sciatica, left side    MRI repeated notes nerve root impingement at L5 on the left . He has deferred surgery and is using gabapentin tid and qhs vicodin for severe pain        Lipoma    Right buttock.  Refer to gen surg for excision        Other Visit Diagnoses     Lipoma of buttock       Relevant Orders   Ambulatory referral to General Surgery       I have discontinued Terrence Dupont "Tom"'s tizanidine. I am also having him maintain his aspirin, traZODone, fluticasone, gabapentin, atorvastatin, clopidogrel, metoprolol tartrate, telmisartan, ondansetron, and HYDROcodone-acetaminophen.  No orders of the defined types were placed in this encounter.   Medications Discontinued During This Encounter  Medication Reason   tizanidine (ZANAFLEX) 2 MG capsule     Follow-up: Return in about 6 months (around 01/04/2021).   Crecencio Mc, MD

## 2020-07-05 NOTE — Patient Instructions (Addendum)
I have made a referral to [facility] to have the lipoma addressed  The Shingles vaccine is a killed virus vaccine. So Dani Gobble should be able to have it done   You can increase the trazodone gradually, first to 75 mg daily (1.5 pills),  and then to 100 mg daily if helpful for the insomnia  If the higher trazodone dose does not help, let me know

## 2020-07-06 ENCOUNTER — Ambulatory Visit

## 2020-07-06 DIAGNOSIS — M6281 Muscle weakness (generalized): Secondary | ICD-10-CM

## 2020-07-06 DIAGNOSIS — M5442 Lumbago with sciatica, left side: Secondary | ICD-10-CM

## 2020-07-06 NOTE — Therapy (Signed)
[facility] [address]. [place], [place], [address] Phone: [phone]   Fax:  [phone]  Physical Therapy Treatment  Patient Details  Name: Dean Obrien MRN: [id number] Date of Birth: 03-21-1960 Referring Provider (PT): Meeler, Whitney   Encounter Date: 07/06/2020   PT End of Session - 07/06/20 0856     Visit Number 4    Number of Visits 17    Date for PT Re-Evaluation 08/22/20    Authorization Type eval: 06/27/20    PT Start Time 0846    PT Stop Time 0930    PT Time Calculation (min) 44 min    Activity Tolerance Patient tolerated treatment well    Behavior During Therapy [facility] for tasks assessed/performed             Past Medical History:  Diagnosis Date   Aortic stenosis, severe 09/09/2018   RHC 09/10/19 Heavily calcified AV. VTI 0.42 cm. AV mean gradient 84.7 mmHg. Vmax measures 5.51 m/s. nl filling pressures, nl pulmonary pressure, nl CO.   CAD (coronary artery disease)    LHC 09/10/2019: pRCA 90%s, pLCx 20%s, mLAD 50%s   Critical aortic valve stenosis 1/85/6314   Diastolic dysfunction    9/70/2637   Hyperlipidemia LDL goal <70    8/19 LDL 87   Hypertension    NSTEMI (non-ST elevated myocardial infarction) ([place]) 09/09/2019   Prediabetes     Past Surgical History:  Procedure Laterality Date   AORTIC VALVE REPLACEMENT N/A 09/14/2019   Procedure: AORTIC VALVE REPLACEMENT (AVR) USING INSPIRIS VALVE SIZE 25MM;  Surgeon: Wonda Olds, MD;  Location: [place];  Service: Open Heart Surgery;  Laterality: N/A;   CORONARY ARTERY BYPASS GRAFT N/A 09/14/2019   Procedure: CORONARY ARTERY BYPASS GRAFTING (CABG) x Two using bilateral Internal mammary arteries;  Surgeon: Wonda Olds, MD;  Location: [facility];  Service: Open Heart Surgery;  Laterality: N/A;   RIGHT/LEFT HEART CATH AND CORONARY ANGIOGRAPHY N/A 09/10/2019   Procedure: RIGHT/LEFT HEART CATH AND CORONARY ANGIOGRAPHY;  Surgeon: Wellington Hampshire, MD;  Location: [place] [facility];  Service: Cardiovascular;  Laterality: N/A;   TEE WITHOUT CARDIOVERSION N/A 09/14/2019   Procedure: TRANSESOPHAGEAL ECHOCARDIOGRAM (TEE);  Surgeon: Wonda Olds, MD;  Location: [place];  Service: Open Heart Surgery;  Laterality: N/A;    There were no vitals filed for this visit.   Subjective Assessment - 07/06/20 0949     Subjective Pt arrives reporting 1/10 left low back pain currently. Standing extensions continue to help with his pain. Overall his symptoms have improved since starting therapy. No specific questions or concerns currently.    Pertinent History Pt arrives complaining of left-sided low back pain with radiating pain in the left buttock and into the left lateral and anterior lateral thigh extending to the anterior portion of the shin and the lateral calf. First episode of back pain was in 2015. No known trauma. Symptoms started as a sharp, intense (9/10) pain in his lower LLE. He saw his PCP who prescribed Gabapentin. At that time he also saw a chiropractor without any relief. He was sent for an injection and 2 weeks after the injection his pain resolved. Since that time he gets pain in his lower back a couple times/year. If he is careful the pain resolves within a few days/weeks. At the end of February he started to have the pain again however it has continued to worsen. After a month he went to his PCP who  referred him to physiatry. He had a spinal injection with mild (approximately 20%) relief of his pain. He was referred to neurosurgery and according to the patient he was told that he "probably needs surgery" but recommended physical therapy first. Pt cares for his wife who has MS and is wheelchair-bound. 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 He had a lumbar MRI which showed multilevel lumbar spondylosis. Findings are most pronounced at L4-5 where there is a prominent left foraminal/extraforaminal disc  herniation resulting in severe left foraminal stenosis and mild-moderate right foraminal stenosis. Correlate for radicular symptoms within the left L4 nerve root distribution. Moderate right and mild left foraminal stenosis at L3-4. Moderate left and mild right foraminal stenosis at L5-S1. No canal stenosis at any level. PMH includes CAD, hyperlipidemia, HTN, prediabetes.    Limitations Sitting;Walking;Standing    How long can you sit comfortably? 30 minutes    How long can you stand comfortably? 5 minutes    How long can you walk comfortably? 5 minutes    Diagnostic tests see history    Patient Stated Goals "I need to feel that the pain is decreasing" Pt would like to be active for >1 hour without stopping due to pain.    Currently in Pain? Yes    Pain Score 1     Pain Location Back    Pain Orientation Lower;Left    Pain Descriptors / Indicators Sharp    Pain Type Chronic pain    Pain Onset More than a month ago    Pain Frequency Constant                TREATMENT    Therapeutic Exercise NuStep L1-3 5 minutes during history (3 minutes unbilled); Hooklying marches with transverse abdominis contraction x 10 BLe;; Hooklying SLR with transverse abdominis contraction x 10 BLE, pt reports mild pain in LLE; Hooklying clams with manual resistance from therapist x 10 BLE; Hooklying adductor squeeze with manual resistance from therapist x 10 BLE; R sidelying L hip straight leg abduction 2 x 10;   Manual Therapy  MHP to lower lumbar spine in prone positioning to improve extension; Prone on elbows L3-L5 CPA, grade II-III, 30s/bout x 2 bouts/level; Prone L3-L5 L UPA, on elbows, 30s/bout x 1 bout/level; Prone L3-L5 L UPA, on elbows in L "roadkill" position, 30s/bout x 1 bout/level; Prone press ups with overpressure provided by therapist at L4-5 2 x 10; R sidelying L lumbar rotation mobilizations, grade III, 30s/bout x 2 bouts; R sidelying gapping mobilization with pillow under side,  grade III, 30s/bout x 2 bouts;     Trigger Point Dry Needling (TDN), unbilled Education performed with patient regarding potential benefit of TDN. Reviewed precautions and risks with patient. Extensive time spent with pt to ensure full understanding of TDN risks. Pt provided verbal consent to treatment. In prone using clean technique TDN performed to bilateral lumbar multifidi (L4 and L5) with 4, 0.30 x 75 single needle placements with deep ache reported (2 on each side). Pistoning technique utilized.      Pt educated throughout session about proper posture and technique with exercises. Improved exercise technique, movement at target joints, use of target muscles after min to mod verbal, visual, tactile cues.      Pt demonstrates excellent motivation during session today. Continued manual techniques for low back as well as trigger point dry needling. He reports progressive improvement since starting  therapy. Progressed strengthening during session today. Overall pt is making good progress toward decrease in pain and improved function at home, work, and while caring for wife. Pt will benefit from PT services to address deficits in strength and pain in order to return to full function at home and work.                             PT Short Term Goals - 06/27/20 1546       PT SHORT TERM GOAL #1   Title Pt will be independent with HEP in order to improve strength and decrease back pain in order to improve pain-free function at home and work.    Time 4    Period Weeks    Status New    Target Date 07/25/20      PT SHORT TERM GOAL #2   Title Pt will decrease worst back pain as reported on NPRS by at least 2 points in order to demonstrate clinically significant reduction in back pain.    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    Time 8    Period Weeks    Status New    Target Date 08/22/20      PT LONG TERM GOAL #3   Title Pt will improve his FOTO score to at least 61 in order to demonstrate significant improvement in his function related to his back pain at home and work    Baseline 06/27/20: 43    Time 8    Period Weeks    Status New    Target Date 08/22/20      PT LONG TERM GOAL #4   Title Pt will decrease mODI scoreby at least 13 points in order demonstrate clinically significant reduction in pain/disability    Baseline 06/27/20: deferred to second session; 06/29/20: 46%    Time 8    Period Weeks    Target Date 08/22/20      PT LONG TERM GOAL #5   Title Pt will report worst low back/LLE pain as no more than 3/10 in order to demonstrate significant improvement in his low back pain    Baseline 06/27/20: worst: 9/10    Time 8    Period Weeks    Status New    Target Date 08/22/20                   Plan - 07/06/20 0949     Clinical Impression Statement Pt demonstrates excellent motivation during session today. Continued manual techniques for low back as well as trigger point dry needling. He reports progressive improvement since  starting therapy. Progressed strengthening during session today. Overall pt is making good progress toward decrease in pain and improved function at home, work, and while caring for wife. Pt will benefit from PT services to address deficits in strength and pain in order to return to full function at home and work.    Personal Factors and Comorbidities Comorbidity 3+;Comorbidity 2;Past/Current  Experience;Time since onset of injury/illness/exacerbation    Comorbidities CAD, HTN    Examination-Activity Limitations Caring for Others;Sit;Stand    Examination-Participation Restrictions Cleaning;Community Activity;Meal Prep;Occupation;Shop    Stability/Clinical Decision Making Evolving/Moderate complexity    Rehab Potential Fair    PT Frequency 2x / week    PT Duration 8 weeks    PT Treatment/Interventions ADLs/Self Care Home Management;Aquatic Therapy;Biofeedback;Canalith Repostioning;Cryotherapy;Electrical Stimulation;Iontophoresis 4mg /ml Dexamethasone;Moist Heat;Traction;Ultrasound;DME Instruction;Gait training;Stair training;Functional mobility training;Therapeutic activities;Therapeutic exercise;Balance training;Neuromuscular re-education;Patient/family education;Manual techniques;Passive range of motion;Dry needling;Vestibular;Spinal Manipulations;Joint Manipulations    PT Next Visit Plan Review HEP, consider long axis distraction/traction    PT Home Exercise Plan Access Code: [email or web address]             Patient will benefit from skilled therapeutic intervention in order to improve the following deficits and impairments:  Abnormal gait, Pain  Visit Diagnosis: Chronic left-sided low back pain with left-sided sciatica  Muscle weakness (generalized)     Problem List Patient Active Problem List   Diagnosis Date Noted   Sciatica, left side 12/31/2019   Type A blood, Rh positive 10/30/2019   Multiple pulmonary nodules determined by computed tomography of lung 10/09/2019   Acute blood loss as cause of postoperative anemia 10/09/2019   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 [place], [place], [address] Phone: [phone]   Fax:  [phone]  Name: Dean Obrien MRN: [id number] Date of Birth: December 10, 1960

## 2020-07-07 DIAGNOSIS — D179 Benign lipomatous neoplasm, unspecified: Secondary | ICD-10-CM | POA: Insufficient documentation

## 2020-07-07 NOTE — Assessment & Plan Note (Signed)
Right buttock.  Refer to gen surg for excision

## 2020-07-07 NOTE — Assessment & Plan Note (Signed)
MRI repeated notes nerve root impingement at L5 on the left . He has deferred surgery and is using gabapentin tid and qhs vicodin for severe pain

## 2020-07-07 NOTE — Assessment & Plan Note (Signed)
He has total revised his diet and has lost 12 lbs.   Repeat a1c is needed  Lab Results  Component Value Date   HGBA1C 5.8 (H) 09/13/2019

## 2020-07-07 NOTE — Assessment & Plan Note (Signed)
Resolved by December labs

## 2020-07-10 NOTE — Patient Instructions (Addendum)
Access Code: [email or web address] URL: [email or web address] Date: 07/11/2020 Prepared by: Roxana Hires  Exercises Prone on Elbows Stretch - 6 x daily - 7 x weekly - 2 minutes progressing to 5 minutes at a time hold Standing Lumbar Extension - 3 x daily - 7 x weekly - 2 sets - 10 reps - 5s hold Supine Anterior Pelvic Tilt - 2 x daily - 7 x weekly - 2 sets - 10 reps - 5s hold Supine March - 2 x daily - 7 x weekly - 2 sets - 10 reps - 5s hold

## 2020-07-11 ENCOUNTER — Other Ambulatory Visit: Payer: Self-pay

## 2020-07-11 ENCOUNTER — Ambulatory Visit

## 2020-07-11 DIAGNOSIS — M5442 Lumbago with sciatica, left side: Secondary | ICD-10-CM | POA: Diagnosis not present

## 2020-07-11 DIAGNOSIS — M6281 Muscle weakness (generalized): Secondary | ICD-10-CM

## 2020-07-11 NOTE — Therapy (Signed)
[facility] [address] [place], [place], [address] Phone: [phone]   Fax:  [phone]  Physical Therapy Treatment  Patient Details  Name: Dean Obrien MRN: [id number] Date of Birth: [birth year] Referring Provider (PT): Meeler, Whitney   Encounter Date: 07/11/2020   PT End of Session - 07/11/20 0854     Visit Number 5    Number of Visits 17    Date for PT Re-Evaluation 08/22/20    Authorization Type eval: 06/27/20    PT Start Time 0846    PT Stop Time 0930    PT Time Calculation (min) 44 min    Activity Tolerance Patient tolerated treatment well    Behavior During Therapy [facility] for tasks assessed/performed             Past Medical History:  Diagnosis Date   Aortic stenosis, severe 09/09/2018   RHC 09/10/19 Heavily calcified AV. VTI 0.42 cm. AV mean gradient 84.7 mmHg. Vmax measures 5.51 m/s. nl filling pressures, nl pulmonary pressure, nl CO.   CAD (coronary artery disease)    LHC 09/10/2019: pRCA 90%s, pLCx 20%s, mLAD 50%s   Critical aortic valve stenosis 4/48/1856   Diastolic dysfunction    04/03/9700   Hyperlipidemia LDL goal <70    8/19 LDL 87   Hypertension    NSTEMI (non-ST elevated myocardial infarction) ([place]) 09/09/2019   Prediabetes     Past Surgical History:  Procedure Laterality Date   AORTIC VALVE REPLACEMENT N/A 09/14/2019   Procedure: AORTIC VALVE REPLACEMENT (AVR) USING INSPIRIS VALVE SIZE 25MM;  Surgeon: Wonda Olds, MD;  Location: [place];  Service: Open Heart Surgery;  Laterality: N/A;   CORONARY ARTERY BYPASS GRAFT N/A 09/14/2019   Procedure: CORONARY ARTERY BYPASS GRAFTING (CABG) x Two using bilateral Internal mammary arteries;  Surgeon: Wonda Olds, MD;  [facility];  Service: Open Heart Surgery;  Laterality: N/A;   RIGHT/LEFT HEART CATH AND CORONARY ANGIOGRAPHY N/A 09/10/2019   Procedure: RIGHT/LEFT HEART CATH AND CORONARY ANGIOGRAPHY;  Surgeon: Wellington Hampshire, MD;  Location: [place] [facility];  Service: Cardiovascular;  Laterality: N/A;   TEE WITHOUT CARDIOVERSION N/A 09/14/2019   Procedure: TRANSESOPHAGEAL ECHOCARDIOGRAM (TEE);  Surgeon: Wonda Olds, MD;  Location: [place];  Service: Open Heart Surgery;  Laterality: N/A;    There were no vitals filed for this visit.   Subjective Assessment - 07/11/20 0852     Subjective Pt arrives reporting 5/10 left low back pain currently. Pain starting increasing yesterday afternoon and has continued to bother him is morning. He noted some increased discomfort in his low back after the last therapy session. No specific questions currently.    Pertinent History Pt arrives complaining of left-sided low back pain with radiating pain in the left buttock and into the left lateral and anterior lateral thigh extending to the anterior portion of the shin and the lateral calf. First episode of back pain was in 2015. No known trauma. Symptoms started as a sharp, intense (9/10) pain in his lower LLE. He saw his PCP who prescribed Gabapentin. At that time he also saw a chiropractor without any relief. He was sent for an injection and 2 weeks after the injection his pain resolved. Since that time he gets pain in his lower back a couple times/year. If he is careful the pain resolves within a few days/weeks. At the end of February he started to have the pain again however it has continued to worsen.  After a month he went to his PCP who referred him to physiatry. He had a spinal injection with mild (approximately 20%) relief of his pain. He was referred to neurosurgery and according to the patient he was told that he "probably needs surgery" but recommended physical therapy first. Pt cares for his wife who has MS and is wheelchair-bound. 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 He had a lumbar MRI which showed multilevel lumbar spondylosis. Findings are most pronounced at L4-5 where there is a  prominent left foraminal/extraforaminal disc herniation resulting in severe left foraminal stenosis and mild-moderate right foraminal stenosis. Correlate for radicular symptoms within the left L4 nerve root distribution. Moderate right and mild left foraminal stenosis at L3-4. Moderate left and mild right foraminal stenosis at L5-S1. No canal stenosis at any level. PMH includes CAD, hyperlipidemia, HTN, prediabetes.    Limitations Sitting;Walking;Standing    How long can you sit comfortably? 30 minutes    How long can you stand comfortably? 5 minutes    How long can you walk comfortably? 5 minutes    Diagnostic tests see history    Patient Stated Goals "I need to feel that the pain is decreasing" Pt would like to be active for >1 hour without stopping due to pain.    Currently in Pain? Yes    Pain Score 5     Pain Location Back    Pain Orientation Left;Lower    Pain Descriptors / Indicators Sharp    Pain Type Chronic pain    Pain Onset More than a month ago                TREATMENT     Therapeutic Exercise NuStep L2 x 5 minutes during history (3 minutes unbilled), concurrent moist heat pack; Standing repeated lumbar extension with pelvis block on // bars x 10, pain increases in back and leg so discontinued; Hooklying marches with transverse abdominis contraction x 15 BLE; Hooklying SLR with transverse abdominis contraction x 15 BLE, pt reports mild pain in L low back with R SLR; Hooklying bridges x 15 with cues for glut contraction but allowing for lumbar extension; Hooklying lumbar rocking rotation x 2 minutes;     Manual Therapy  Prone press ups x 10; Prone press ups with belt lock at L4/5 2 x 10, end range oscillation performed at end range during last rep; Prone on elbows L3-L5 CPA, grade II-III, 30s/bout x 2 bouts/level; Prone L3-L5 L UPA, on elbows, 30s/bout x 1 bout/level; Manual lumbar traction with belt assist and pt in hooklying, varied oscillation and 20s static  holds; Long axis L hip distraction with belt assist, grade III, 30s/bout x 3 bouts; L hip inferior mobilizations with belt assist and hip at 90 flexion, grade III, 30s/bout x 3 bouts; L hip inferior mobilizations with belt assist and hip in FABER position, grade III, 30s/bout x 3 bouts;     Pt educated throughout session about proper posture and technique with exercises. Improved exercise technique, movement at target joints, use of target muscles after min to mod verbal, visual, tactile cues.      Pt demonstrates excellent motivation during session today. Continued manual techniques for low back however deferred trigger point dry needling as he has not had any notable benefit from previous sessions. He has had a slight regression in his back pain since the last therapy session. Introduced lumbar manual traction  and L hip mobilizations today. HEP progression provided today. Pt will benefit from PT services to address deficits in strength and pain in order to return to full function at home and work.                                PT Short Term Goals - 06/27/20 1546       PT SHORT TERM GOAL #1   Title Pt will be independent with HEP in order to improve strength and decrease back pain in order to improve pain-free function at home and work.    Time 4    Period Weeks    Status New    Target Date 07/25/20      PT SHORT TERM GOAL #2   Title Pt will decrease worst back pain as reported on NPRS by at least 2 points in order to demonstrate clinically significant reduction in back pain.    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    Time 8    Period Weeks    Status New    Target Date 08/22/20      PT LONG TERM GOAL #3   Title Pt will improve his FOTO score to at least 61 in order to demonstrate significant improvement in his function related to his back pain at home and work    Baseline 06/27/20: 43    Time 8    Period Weeks    Status New    Target Date 08/22/20      PT LONG TERM GOAL #4   Title Pt will decrease mODI scoreby at least 13 points in order demonstrate clinically significant reduction in pain/disability    Baseline 06/27/20: deferred to second session; 06/29/20: 46%    Time 8    Period Weeks    Target Date 08/22/20      PT LONG TERM GOAL #5   Title Pt will report worst low back/LLE pain as no more than 3/10 in order to demonstrate significant improvement in his low back pain    Baseline 06/27/20: worst: 9/10    Time 8    Period Weeks    Status New    Target Date 08/22/20                   Plan - 07/11/20 0854     Clinical Impression Statement Pt demonstrates excellent motivation during session today. Continued manual techniques for low back however deferred trigger point dry needling as he has not had any notable benefit from previous sessions. He has had a slight regression in his back pain  since the last therapy session. Introduced lumbar manual traction and L hip mobilizations today. HEP progression provided today. Pt will benefit from PT services to address deficits in strength and pain in order to return to full function at home and work.    Personal Factors and Comorbidities Comorbidity 3+;Comorbidity 2;Past/Current Experience;Time since onset of injury/illness/exacerbation    Comorbidities CAD, HTN    Examination-Activity Limitations Caring for Others;Sit;Stand    Examination-Participation  Restrictions Cleaning;Community Activity;Meal Prep;Occupation;Shop    Stability/Clinical Decision Making Evolving/Moderate complexity    Rehab Potential Fair    PT Frequency 2x / week    PT Duration 8 weeks    PT Treatment/Interventions ADLs/Self Care Home Management;Aquatic Therapy;Biofeedback;Canalith Repostioning;Cryotherapy;Electrical Stimulation;Iontophoresis 4mg /ml Dexamethasone;Moist Heat;Traction;Ultrasound;DME Instruction;Gait training;Stair training;Functional mobility training;Therapeutic activities;Therapeutic exercise;Balance training;Neuromuscular re-education;Patient/family education;Manual techniques;Passive range of motion;Dry needling;Vestibular;Spinal Manipulations;Joint Manipulations    PT Next Visit Plan Review HEP, consider long axis distraction/traction    PT Home Exercise Plan Access Code: [email or web address]             Patient will benefit from skilled therapeutic intervention in order to improve the following deficits and impairments:  Abnormal gait, Pain  Visit Diagnosis: Chronic left-sided low back pain with left-sided sciatica  Muscle weakness (generalized)     Problem List Patient Active Problem List   Diagnosis Date Noted   Lipoma 07/07/2020   Sciatica, left side 12/31/2019   Type A blood, Rh positive 10/30/2019   Multiple pulmonary nodules determined by computed tomography of lung 10/09/2019   Acute blood loss as cause of postoperative anemia 10/09/2019   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 [place], [place],  [address] Phone: [phone]   Fax:  [phone]  Name: Dean Obrien MRN: [id number] Date of Birth: 1960-03-10

## 2020-07-13 ENCOUNTER — Other Ambulatory Visit: Payer: Self-pay

## 2020-07-13 ENCOUNTER — Ambulatory Visit

## 2020-07-13 ENCOUNTER — Telehealth: Payer: Self-pay | Admitting: Cardiovascular Disease

## 2020-07-13 DIAGNOSIS — G8929 Other chronic pain: Secondary | ICD-10-CM

## 2020-07-13 DIAGNOSIS — M6281 Muscle weakness (generalized): Secondary | ICD-10-CM

## 2020-07-13 DIAGNOSIS — M5442 Lumbago with sciatica, left side: Secondary | ICD-10-CM | POA: Diagnosis not present

## 2020-07-13 NOTE — Patient Instructions (Signed)
Access Code: [email or web address] URL: [email or web address] Date: 07/13/2020 Prepared by: Roxana Hires  Exercises Prone on Elbows Stretch - 6 x daily - 7 x weekly - 2 minutes progressing to 5 minutes at a time hold Standing Lumbar Extension - 3 x daily - 7 x weekly - 2 sets - 10 reps - 5s hold Supine Anterior Pelvic Tilt - 2 x daily - 7 x weekly - 2 sets - 10 reps - 5s hold Supine March - 2 x daily - 7 x weekly - 2 sets - 10 reps - 5s hold Supine Bridge - 2 x daily - 7 x weekly - 2 sets - 10 reps - 5s hold

## 2020-07-13 NOTE — Telephone Encounter (Signed)
Dr. Rockey Situ Pt had CABG and AVR 09/14/19. He now needs a soft tissue mass excised. OK to hold plavix for 7 day and ASA for 5 days?

## 2020-07-13 NOTE — Therapy (Signed)
[place] [address]. [place], [place], [address] Phone: [phone]   Fax:  [phone]  Physical Therapy Treatment  Patient Details  Name: Dean Obrien MRN: [id number] Date of Birth: 1960-11-22 Referring Provider (PT): Meeler, Whitney   Encounter Date: 07/13/2020   PT End of Session - 07/13/20 0900     Visit Number 6    Number of Visits 17    Date for PT Re-Evaluation 08/22/20    Authorization Type eval: 06/27/20    PT Start Time 0848    PT Stop Time 0930    PT Time Calculation (min) 42 min    Activity Tolerance Patient tolerated treatment well    Behavior During Therapy [facility] for tasks assessed/performed             Past Medical History:  Diagnosis Date   Aortic stenosis, severe 09/09/2018   RHC 09/10/19 Heavily calcified AV. VTI 0.42 cm. AV mean gradient 84.7 mmHg. Vmax measures 5.51 m/s. nl filling pressures, nl pulmonary pressure, nl CO.   CAD (coronary artery disease)    LHC 09/10/2019: pRCA 90%s, pLCx 20%s, mLAD 50%s   Critical aortic valve stenosis 6/96/2952   Diastolic dysfunction    8/41/3244   Hyperlipidemia LDL goal <70    8/19 LDL 87   Hypertension    NSTEMI (non-ST elevated myocardial infarction) ([place]) 09/09/2019   Prediabetes     Past Surgical History:  Procedure Laterality Date   AORTIC VALVE REPLACEMENT N/A 09/14/2019   Procedure: AORTIC VALVE REPLACEMENT (AVR) USING INSPIRIS VALVE SIZE 25MM;  Surgeon: Wonda Olds, MD;  Location: [place];  Service: Open Heart Surgery;  Laterality: N/A;   CORONARY ARTERY BYPASS GRAFT N/A 09/14/2019   Procedure: CORONARY ARTERY BYPASS GRAFTING (CABG) x Two using bilateral Internal mammary arteries;  Surgeon: Wonda Olds, MD;  [facility];  Service: Open Heart Surgery;  Laterality: N/A;   RIGHT/LEFT HEART CATH AND CORONARY ANGIOGRAPHY N/A 09/10/2019   Procedure: RIGHT/LEFT HEART CATH AND CORONARY ANGIOGRAPHY;  Surgeon: Wellington Hampshire, MD;  Location: [place] [facility];  Service: Cardiovascular;  Laterality: N/A;   TEE WITHOUT CARDIOVERSION N/A 09/14/2019   Procedure: TRANSESOPHAGEAL ECHOCARDIOGRAM (TEE);  Surgeon: Wonda Olds, MD;  Location: [place];  Service: Open Heart Surgery;  Laterality: N/A;    There were no vitals filed for this visit.   Subjective Assessment - 07/13/20 0857     Subjective Pt arrives reporting 1/10 left low back pain currently. He was in a lot of pain yesterday after work but it is improved this morning. No specific questions currently.    Pertinent History Pt arrives complaining of left-sided low back pain with radiating pain in the left buttock and into the left lateral and anterior lateral thigh extending to the anterior portion of the shin and the lateral calf. First episode of back pain was in 2015. No known trauma. Symptoms started as a sharp, intense (9/10) pain in his lower LLE. He saw his PCP who prescribed Gabapentin. At that time he also saw a chiropractor without any relief. He was sent for an injection and 2 weeks after the injection his pain resolved. Since that time he gets pain in his lower back a couple times/year. If he is careful the pain resolves within a few days/weeks. At the end of February he started to have the pain again however it has continued to worsen. After a month he went to his PCP who referred him  to physiatry. He had a spinal injection with mild (approximately 20%) relief of his pain. He was referred to neurosurgery and according to the patient he was told that he "probably needs surgery" but recommended physical therapy first. Pt cares for his wife who has MS and is wheelchair-bound. 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 He had a lumbar MRI which showed multilevel lumbar spondylosis. Findings are most pronounced at L4-5 where there is a prominent left foraminal/extraforaminal disc herniation resulting in severe left foraminal  stenosis and mild-moderate right foraminal stenosis. Correlate for radicular symptoms within the left L4 nerve root distribution. Moderate right and mild left foraminal stenosis at L3-4. Moderate left and mild right foraminal stenosis at L5-S1. No canal stenosis at any level. PMH includes CAD, hyperlipidemia, HTN, prediabetes.    Limitations Sitting;Walking;Standing    How long can you sit comfortably? 30 minutes    How long can you stand comfortably? 5 minutes    How long can you walk comfortably? 5 minutes    Diagnostic tests see history    Patient Stated Goals "I need to feel that the pain is decreasing" Pt would like to be active for >1 hour without stopping due to pain.    Currently in Pain? Yes    Pain Score 1     Pain Location Back    Pain Orientation Left;Lower    Pain Descriptors / Indicators Aching    Pain Type Chronic pain    Pain Onset More than a month ago    Pain Frequency Constant                TREATMENT     Therapeutic Exercise NuStep L2-4 x 5 minutes during history (3 minutes unbilled), concurrent moist heat pack; Bilateral calf step stretch 60s x 2; Hooklying lumbar rocking rotation x 2 minutes; HEP modification with review;     Manual Therapy  Prone press ups x 10; Prone press ups with belt lock at L4/5 2 x 10, end range oscillation performed at end range during last rep; Prone on elbows L3-L5 CPA, grade II-III, 30s/bout x 2 bouts/level; Prone L3-L5 L UPA, on elbows, 30s/bout x 1 bout/level; R sidelying L lumbar rotation mobilizations, grade III, 30s/bout x 3 bouts; R sidelying gapping mobilization with pillow under R side and legs of table as counter weight, grade III, 30s/bout x 2 bouts; Long axis bilateral hip distraction grade III, 30s/bout x 2 bouts bilateral; L single knee to chest stretch x 45s; L FADIR and FABER stretch x 45s each;    Pt educated throughout session about proper posture and technique with exercises. Improved exercise  technique, movement at target joints, use of target muscles after min to mod verbal, visual, tactile cues.      Pt demonstrates excellent motivation during session today. Continued manual techniques for low back including lumbar mobilizations and stretches. Also continued prone press ups with overpressure. Back pain is significantly better today compared to previous session. HEP progression provided today. Pt will benefit from PT services to address deficits in strength and pain in order to return to full function at home and work.                              PT Short Term Goals - 06/27/20 1546       PT SHORT TERM GOAL #1  Title Pt will be independent with HEP in order to improve strength and decrease back pain in order to improve pain-free function at home and work.    Time 4    Period Weeks    Status New    Target Date 07/25/20      PT SHORT TERM GOAL #2   Title Pt will decrease worst back pain as reported on NPRS by at least 2 points in order to demonstrate clinically significant reduction in back pain.    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    Time 8    Period Weeks    Status New    Target Date 08/22/20      PT LONG TERM GOAL #3   Title Pt will improve his FOTO score to at least 61 in order to demonstrate significant improvement in his function related to his back pain at home and work    Baseline 06/27/20: 43    Time 8     Period Weeks    Status New    Target Date 08/22/20      PT LONG TERM GOAL #4   Title Pt will decrease mODI scoreby at least 13 points in order demonstrate clinically significant reduction in pain/disability    Baseline 06/27/20: deferred to second session; 06/29/20: 46%    Time 8    Period Weeks    Target Date 08/22/20      PT LONG TERM GOAL #5   Title Pt will report worst low back/LLE pain as no more than 3/10 in order to demonstrate significant improvement in his low back pain    Baseline 06/27/20: worst: 9/10    Time 8    Period Weeks    Status New    Target Date 08/22/20                   Plan - 07/13/20 0900     Clinical Impression Statement Pt demonstrates excellent motivation during session today. Continued manual techniques for low back including lumbar mobilizations and stretches. Also continued prone press ups with overpressure. Back pain is significantly better today compared to previous session. HEP progression provided today. Pt will benefit from PT services to address deficits in strength and pain in order to return to full function at home and work.    Personal Factors and Comorbidities Comorbidity 3+;Comorbidity 2;Past/Current Experience;Time since onset of injury/illness/exacerbation    Comorbidities CAD, HTN    Examination-Activity Limitations Caring for Others;Sit;Stand    Examination-Participation Restrictions Cleaning;Community Activity;Meal Prep;Occupation;Shop    Stability/Clinical Decision Making Evolving/Moderate complexity    Rehab Potential Fair    PT Frequency 2x / week  PT Duration 8 weeks    PT Treatment/Interventions ADLs/Self Care Home Management;Aquatic Therapy;Biofeedback;Canalith Repostioning;Cryotherapy;Electrical Stimulation;Iontophoresis 4mg /ml Dexamethasone;Moist Heat;Traction;Ultrasound;DME Instruction;Gait training;Stair training;Functional mobility training;Therapeutic activities;Therapeutic exercise;Balance training;Neuromuscular  re-education;Patient/family education;Manual techniques;Passive range of motion;Dry needling;Vestibular;Spinal Manipulations;Joint Manipulations    PT Next Visit Plan Review HEP, consider long axis distraction/traction    PT Home Exercise Plan Access Code: [email or web address]             Patient will benefit from skilled therapeutic intervention in order to improve the following deficits and impairments:  Abnormal gait, Pain  Visit Diagnosis: Chronic left-sided low back pain with left-sided sciatica  Muscle weakness (generalized)     Problem List Patient Active Problem List   Diagnosis Date Noted   Lipoma 07/07/2020   Sciatica, left side 12/31/2019   Type A blood, Rh positive 10/30/2019   Multiple pulmonary nodules determined by computed tomography of lung 10/09/2019   Acute blood loss as cause of postoperative anemia 10/09/2019   S/P CABG x 2 09/14/2019   S/P aortic valve replacement with bioprosthetic valve 09/14/2019   Coronary artery disease 09/14/2019   CAD (coronary artery disease) 09/10/2019   Prediabetes 09/10/2019   Hyperlipidemia LDL goal <70 09/10/2019   History of syncope 09/09/2019   Nicotine dependence 09/09/2019   HYPERTENSION, BENIGN 11/17/2009   Phillips Grout PT, DPT, GCS  Aara Jacquot 07/13/2020, 9:44 AM  [place] [facility] [address]. [place], [place], [address] Phone: [phone]   Fax:  [phone]  Name: Armstrong Creasy MRN: [id number] Date of Birth: [birth year]

## 2020-07-13 NOTE — Telephone Encounter (Signed)
   [facility] Pre-operative Risk Assessment    Patient Name: Dean Obrien  DOB: February 27, 1960  MRN: [id number]   HEARTCARE STAFF: - Please ensure there is not already an duplicate clearance open for this procedure. - Under Visit Info/Reason for Call, type in Other and utilize the format Clearance MM/DD/YY or Clearance TBD. Do not use dashes or single digits. - If request is for dental extraction, please clarify the # of teeth to be extracted. - If the patient is currently at the dentist's office, call Pre-Op APP to address. If the patient is not currently in the dentist office, please route to the Pre-Op pool  Request for surgical clearance:  What type of surgery is being performed? Excision of soft tissue mass   When is this surgery scheduled? 08/28/20   What type of clearance is required (medical clearance vs. Pharmacy clearance to hold med vs. Both)? both  Are there any medications that need to be held prior to surgery and how long?hold plavix x 7 days - ASA x 5 days    Practice name and name of physician performing surgery? [facility] - Dr Peyton Najjar  What is the office phone number? [phone]   7.   What is the office fax number? [phone]  8.   Anesthesia type (None, local, MAC, general) ? Not listed    Ace Gins 07/13/2020, 8:14 AM  _________________________________________________________________   (provider comments below)

## 2020-07-17 NOTE — Telephone Encounter (Signed)
   Name: Dean Obrien  DOB: 1960/04/13  MRN: [id number]   Primary Cardiologist: Dean Rogue, MD  Chart reviewed as part of pre-operative protocol coverage. Patient was contacted 07/17/2020 in reference to pre-operative risk assessment for pending surgery as outlined below.  Dean Obrien was last seen on 04/28/20 by Dr. Rockey Obrien.  Since that day, Dean Obrien has done well. He can complete more than 4.0 METS without angina.   Per Dr. Rockey Obrien: Would stay on aspirin 81 mg daily. Stop Plavix 5 to 7 days what ever is needed before procedure.   Therefore, based on ACC/AHA guidelines, the patient would be at acceptable risk for the planned procedure without further cardiovascular testing.   The patient was advised that if he develops new symptoms prior to surgery to contact our office to arrange for a follow-up visit, and he verbalized understanding.  I will route this recommendation to the requesting party via Epic fax function and remove from pre-op pool. Please call with questions.  Tami Lin Dean Jaquith, PA 07/17/2020, 8:22 AM

## 2020-07-18 ENCOUNTER — Other Ambulatory Visit: Payer: Self-pay

## 2020-07-18 ENCOUNTER — Ambulatory Visit: Admitting: Physical Therapy

## 2020-07-18 DIAGNOSIS — M5442 Lumbago with sciatica, left side: Secondary | ICD-10-CM | POA: Diagnosis not present

## 2020-07-18 DIAGNOSIS — G8929 Other chronic pain: Secondary | ICD-10-CM

## 2020-07-18 DIAGNOSIS — M6281 Muscle weakness (generalized): Secondary | ICD-10-CM

## 2020-07-18 NOTE — Therapy (Signed)
[facility] [address] [place], [place], [address] Phone: [phone]   Fax:  [phone]  Physical Therapy Treatment  Patient Details  Name: Dean Obrien MRN: [id number] Date of Birth: March 14, 1960 Referring Provider (PT): Meeler, Whitney   Encounter Date: 07/18/2020   PT End of Session - 07/18/20 1002     Visit Number 7    Number of Visits 17    Date for PT Re-Evaluation 08/22/20    Authorization Type eval: 06/27/20    PT Start Time 0847    PT Stop Time 0932    PT Time Calculation (min) 45 min    Activity Tolerance Patient tolerated treatment well    Behavior During Therapy [facility] for tasks assessed/performed             Past Medical History:  Diagnosis Date   Aortic stenosis, severe 09/09/2018   RHC 09/10/19 Heavily calcified AV. VTI 0.42 cm. AV mean gradient 84.7 mmHg. Vmax measures 5.51 m/s. nl filling pressures, nl pulmonary pressure, nl CO.   CAD (coronary artery disease)    LHC 09/10/2019: pRCA 90%s, pLCx 20%s, mLAD 50%s   Critical aortic valve stenosis 09/14/2351   Diastolic dysfunction    07/04/4313   Hyperlipidemia LDL goal <70    8/19 LDL 87   Hypertension    NSTEMI (non-ST elevated myocardial infarction) ([place]) 09/09/2019   Prediabetes     Past Surgical History:  Procedure Laterality Date   AORTIC VALVE REPLACEMENT N/A 09/14/2019   Procedure: AORTIC VALVE REPLACEMENT (AVR) USING INSPIRIS VALVE SIZE 25MM;  Surgeon: Wonda Olds, MD;  Location: [place];  Service: Open Heart Surgery;  Laterality: N/A;   CORONARY ARTERY BYPASS GRAFT N/A 09/14/2019   Procedure: CORONARY ARTERY BYPASS GRAFTING (CABG) x Two using bilateral Internal mammary arteries;  Surgeon: Wonda Olds, MD;  Location: [facility];  Service: Open Heart Surgery;  Laterality: N/A;   RIGHT/LEFT HEART CATH AND CORONARY ANGIOGRAPHY N/A 09/10/2019   Procedure: RIGHT/LEFT HEART CATH AND CORONARY ANGIOGRAPHY;  Surgeon: Wellington Hampshire, MD;  Location: [place] CV [facility];  Service: Cardiovascular;  Laterality: N/A;   TEE WITHOUT CARDIOVERSION N/A 09/14/2019   Procedure: TRANSESOPHAGEAL ECHOCARDIOGRAM (TEE);  Surgeon: Wonda Olds, MD;  Location: [place];  Service: Open Heart Surgery;  Laterality: N/A;    There were no vitals filed for this visit.   Subjective Assessment - 07/18/20 0851     Subjective Pt arrives reporting 4/10 left low back pain currently. Pain has ranged between a 4-5/10 for the past 4 days after working 3 days in a row (Fri-Sun). States this may be his last session due to going out of town and plan to recieve an injection as soon as he is back in town. States he is not sure if he will return to PT after injection - he will speak to his referring provider. No specific questions currently.    Pertinent History Pt arrives complaining of left-sided low back pain with radiating pain in the left buttock and into the left lateral and anterior lateral thigh extending to the anterior portion of the shin and the lateral calf. First episode of back pain was in 2015. No known trauma. Symptoms started as a sharp, intense (9/10) pain in his lower LLE. He saw his PCP who prescribed Gabapentin. At that time he also saw a chiropractor without any relief. He was sent for an injection and 2 weeks after the injection his pain resolved. Since that  time he gets pain in his lower back a couple times/year. If he is careful the pain resolves within a few days/weeks. At the end of February he started to have the pain again however it has continued to worsen. After a month he went to his PCP who referred him to physiatry. He had a spinal injection with mild (approximately 20%) relief of his pain. He was referred to neurosurgery and according to the patient he was told that he "probably needs surgery" but recommended physical therapy first. Pt cares for his wife who has MS and is wheelchair-bound. 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 He had a lumbar MRI which showed multilevel lumbar spondylosis. Findings are most pronounced at L4-5 where there is a prominent left foraminal/extraforaminal disc herniation resulting in severe left foraminal stenosis and mild-moderate right foraminal stenosis. Correlate for radicular symptoms within the left L4 nerve root distribution. Moderate right and mild left foraminal stenosis at L3-4. Moderate left and mild right foraminal stenosis at L5-S1. No canal stenosis at any level. PMH includes CAD, hyperlipidemia, HTN, prediabetes.    Limitations Sitting;Walking;Standing    How long can you sit comfortably? 30 minutes    How long can you stand comfortably? 5 minutes    How long can you walk comfortably? 5 minutes    Diagnostic tests see history    Patient Stated Goals "I need to feel that the pain is decreasing" Pt would like to be active for >1 hour without stopping due to pain.    Currently in Pain? Yes    Pain Score 4     Pain Location Back    Pain Orientation Left;Lower    Pain Descriptors / Indicators Aching    Pain Onset More than a month ago               TREATMENT     Therapeutic Exercise NuStep L2-4 x 5 minutes during history (3 minutes unbilled), concurrent moist heat pack; Hooklying lumbar rocking rotation x 2 minutes;     Manual Therapy  Prone press ups x 10; Prone press ups with belt lock at L4/5 2 x 10, end range oscillation performed at end range during last rep; Prone on elbows L3-L5 CPA, grade II-III, 30s/bout x 2 bouts/level; Prone L3-L5 L UPA, on elbows, 30s/bout x 1 bout/level; R sidelying L lumbar rotation mobilizations, grade III, 30s/bout x 3 bouts; R sidelying gapping mobilization with pillow under R side and legs off table as counter weight, grade III, 30s/bout x 2 bouts; Long axis bilateral hip distraction grade III, 30s/bout x 2 bouts bilateral; L single knee to chest stretch x 45s; L FADIR and FABER stretch x 45s  each;     Pt educated throughout session about proper posture and technique with exercises. Improved exercise technique, movement at target joints, use of target muscles after min to mod verbal, visual, tactile cues.      Pt demonstrates excellent motivation during session today. He reported this may be his last session so progress towards goals were discussed and OM were completed at end of session. Continued manual techniques for low back including lumbar mobilizations and stretches. Also continued prone press ups with overpressure. Back pain seems to have increased since last session - pt states the onset of pain correlates with the 3 shifts he worked with long hours of standing. HEP was reviewed today and progressed with 2 additional  stretches today. PT predicts patient will return to therapy after injection; will leave pt file open and follow up in 3 weeks. Pt will benefit from PT services to address deficits in strength and pain in order to return to full function at home and work.         PT Short Term Goals - 07/18/20 1008       PT SHORT TERM GOAL #1   Title Pt will be independent with HEP in order to improve strength and decrease back pain in order to improve pain-free function at home and work.    Time 4    Period Weeks    Status Achieved    Target Date 07/25/20      PT SHORT TERM GOAL #2   Title Pt will decrease worst back pain as reported on NPRS by at least 2 points in order to demonstrate clinically significant reduction in back pain.    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 in his pain    Baseline 07/18/20: 5 minutes    Time 8    Period Weeks    Status On-going      PT LONG TERM GOAL #3   Title Pt will improve his FOTO score to at least 61 in order to demonstrate significant improvement in his function related to his back pain at home and work    Baseline 06/27/20: 43, 07/18/20: 45    Time 8    Period Weeks    Status On-going      PT LONG TERM GOAL #4   Title Pt will decrease mODI scoreby at least 13 points in order demonstrate clinically significant reduction in pain/disability    Baseline 06/27/20: deferred to second session; 06/29/20: 46%, 07/18/20: 50%    Time 8    Period Weeks    Status On-going      PT LONG TERM GOAL #5   Title Pt will report worst low back/LLE pain as no more than 3/10 in order to demonstrate significant improvement in his low back pain    Baseline 06/27/20: worst: 9/10, 07/18/20: 6/10    Time 8    Period Weeks    Status On-going                   Plan - 07/18/20 1003     Clinical Impression Statement Pt demonstrates excellent motivation during session today. He reported this may be his last session so progress towards goals were discussed and OM were completed at end of session. Continued manual techniques for low back including lumbar mobilizations and stretches. Also continued prone press ups with overpressure. Back pain seems to  have increased since last session - pt states the onset of pain correlates with the 3 shifts he worked with long hours of standing. HEP was reviewed today and progressed with 2 additional stretches today. PT predicts patient will return to therapy after injection; will leave pt file open and follow up in 3 weeks. Pt will benefit from PT services to address deficits in strength and pain in order to return to full function at home and  work.    Personal Factors and Comorbidities Comorbidity 3+;Comorbidity 2;Past/Current Experience;Time since onset of injury/illness/exacerbation    Comorbidities CAD, HTN    Examination-Activity Limitations Caring for Others;Sit;Stand    Examination-Participation Restrictions Cleaning;Community Activity;Meal Prep;Occupation;Shop    Stability/Clinical Decision Making Evolving/Moderate complexity    Rehab Potential Fair    PT Frequency 2x / week    PT Duration 8 weeks    PT Treatment/Interventions ADLs/Self Care Home Management;Aquatic Therapy;Biofeedback;Canalith Repostioning;Cryotherapy;Electrical Stimulation;Iontophoresis 4mg /ml Dexamethasone;Moist Heat;Traction;Ultrasound;DME Instruction;Gait training;Stair training;Functional mobility training;Therapeutic activities;Therapeutic exercise;Balance training;Neuromuscular re-education;Patient/family education;Manual techniques;Passive range of motion;Dry needling;Vestibular;Spinal Manipulations;Joint Manipulations    PT Next Visit Plan Review HEP, consider long axis distraction/traction    PT Home Exercise Plan Access Code: [email or web address]             Patient will benefit from skilled therapeutic intervention in order to improve the following deficits and impairments:  Abnormal gait, Pain  Visit Diagnosis: Chronic left-sided low back pain with left-sided sciatica  Muscle weakness (generalized)     Problem List Patient Active Problem List   Diagnosis Date Noted   Lipoma 07/07/2020   Sciatica, left side 12/31/2019   Type A blood, Rh positive 10/30/2019   Multiple pulmonary nodules determined by computed tomography of lung 10/09/2019   Acute blood loss as cause of postoperative anemia 10/09/2019   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 [place], [place], [address] Phone: [phone]   Fax:  [phone]  Name: Dean Obrien MRN: [id number] Date of Birth: 1960-04-01

## 2020-07-20 ENCOUNTER — Encounter

## 2020-07-25 ENCOUNTER — Encounter

## 2020-07-27 ENCOUNTER — Encounter

## 2020-08-01 ENCOUNTER — Encounter

## 2020-08-03 ENCOUNTER — Encounter

## 2020-08-09 ENCOUNTER — Ambulatory Visit: Payer: Self-pay | Admitting: General Surgery

## 2020-08-09 NOTE — H&P (Signed)
PATIENT PROFILE: Dean Obrien is a 60 y.o. male who presents to the Clinic for consultation at the request of Dr. Derrel Nip for evaluation of right buttock soft tissue mass.   PCP:  Jannifer Hick, MD   HISTORY OF PRESENT ILLNESS: Dean Obrien reports having a right buttock subcu mass since around 10 years ago.  He reports that he was not symptomatic until recently.  He reported that it is getting more uncomfortable and is slightly getting bigger.  He denies any previous surgery, previous infection of the mass.  He denies any drainage.  Discomfort and tenderness is localized to the right buttock area.  There is no pain radiation.  Aggravating factor is applying pressure and sitting down.  There has been no alleviating factors.     PROBLEM LIST: Problem List  Date Reviewed: 05/30/2020  None        GENERAL REVIEW OF SYSTEMS:    General ROS: negative for - chills, fatigue, fever, weight gain or weight loss Allergy and Immunology ROS: negative for - hives  Hematological and Lymphatic ROS: negative for - bleeding problems or bruising, negative for palpable nodes Endocrine ROS: negative for - heat or cold intolerance, hair changes Respiratory ROS: negative for - cough, shortness of breath or wheezing Cardiovascular ROS: no chest pain or palpitations GI ROS: negative for nausea, vomiting, abdominal pain, diarrhea, constipation Musculoskeletal ROS: negative for - joint swelling or muscle pain Neurological ROS: negative for - confusion, syncope Dermatological ROS: negative for pruritus and rash Psychiatric: negative for anxiety, depression, difficulty sleeping and memory loss   MEDICATIONS: Current Medications        Current Outpatient Medications  Medication Sig Dispense Refill   atorvastatin (LIPITOR) 40 MG tablet Take 40 mg by mouth once daily       clopidogreL (PLAVIX) 75 mg tablet Take 75 mg by mouth once daily       FUROsemide (LASIX) 40 MG tablet Take 40 mg by mouth once daily        gabapentin (NEURONTIN) 100 MG capsule Take 100 mg by mouth nightly       HYDROcodone-acetaminophen (NORCO) 5-325 mg tablet Take 1 tablet by mouth once daily as needed for Pain (only takes when the pain is extremely bad, not everyday)       ondansetron (ZOFRAN-ODT) 4 MG disintegrating tablet as needed       telmisartan (MICARDIS) 20 MG tablet once daily       traZODone (DESYREL) 50 MG tablet Take 50 mg by mouth nightly as needed for Sleep        No current facility-administered medications for this visit.        ALLERGIES: Benadryl [diphenhydramine hcl] and Tramadol   PAST MEDICAL HISTORY:     Past Medical History:  Diagnosis Date   Heart attack (CMS-HCC)     Hypertension        PAST SURGICAL HISTORY:      Past Surgical History:  Procedure Laterality Date   cardiac bypass graft   09/19/2019    [place] ---- double   REPLACEMENT AORTIC VALVE   09/19/2019    [place]      FAMILY HISTORY:      Family History  Problem Relation Age of Onset   High blood pressure (Hypertension) Mother     High blood pressure (Hypertension) Father     Lung cancer Father     High blood pressure (Hypertension) Brother     Lung cancer Brother  SOCIAL HISTORY: Social History           Socioeconomic History   Marital status: Married  Tobacco Use   Smoking status: Former Smoker      Years: 45.00      Quit date: 09/09/2019      Years since quitting: 0.8   Smokeless tobacco: Never Used  Vaping Use   Vaping Use: Never used  Substance and Sexual Activity   Alcohol use: Never   Drug use: Never        PHYSICAL EXAM:    Vitals:    07/11/20 1333  BP: 139/73  Pulse: 71    Body mass index is 26.04 kg/m. Weight: 66.7 kg (147 lb)    GENERAL: Alert, active, oriented x3   HEENT: Pupils equal reactive to light. Extraocular movements are intact. Sclera clear. Palpebral conjunctiva normal red color.Pharynx clear.   NECK: Supple with no palpable mass and no  adenopathy.   LUNGS: Sound clear with no rales rhonchi or wheezes.   HEART: Regular rhythm S1 and S2 without murmur.   ABDOMEN: Soft and depressible, nontender with no palpable mass, no hepatomegaly.    EXTREMITIES: Well-developed well-nourished symmetrical with no dependent edema.  Right hip/buttock area with soft tissue mass greater than 12 cm.  Rubbery, mobile.  No skin changes.   NEUROLOGICAL: Awake alert oriented, facial expression symmetrical, moving all extremities.   REVIEW OF DATA: I have reviewed the following data today: No visits with results within 3 Month(s) from this visit.  Latest known visit with results is:  No results found for any previous visit.      ASSESSMENT: Dean Obrien is a 60 y.o. male presenting for consultation for right hip/buttock soft tissue mass.     Patient with a soft tissue mass on the right buttock for more than 10 years.  Due to the mass increasing in size and getting more uncomfortable patient is requesting excision.  Due to the size of the mass and possibly at the place of the mass I would recommend to do the excision of the mass in the operation room.  Patient was oriented about the procedure.  Patient was oriented about the risk that includes infection, bleeding, seroma, pain, scar tissue, among others.   Patient has history of coronary artery disease.  He denies any chest pain.  No chest pain on exertion either.  He has been followed by Dr. Rockey Situ.  We will request cardiology clearance.   Palpable mass of soft tissue of pelvis [M79.89]   PLAN: Excision of right buttock soft tissue mass (21931) CBC, CMP Cardiology Clearance Hold Plavix 7 days before surgery Contact us if you have any concern.    Patient verbalized understanding, all questions were answered, and were agreeable with the plan outlined above.      Herbert Pun, MD   Electronically signed by Herbert Pun, MD

## 2020-08-09 NOTE — H&P (View-Only) (Signed)
PATIENT PROFILE: Dean Obrien is a 60 y.o. male who presents to the Clinic for consultation at the request of Dr. Derrel Nip for evaluation of right buttock soft tissue mass.   PCP:  Jannifer Hick, MD   HISTORY OF PRESENT ILLNESS: Mr. Dean Obrien reports having a right buttock subcu mass since around 10 years ago.  He reports that he was not symptomatic until recently.  He reported that it is getting more uncomfortable and is slightly getting bigger.  He denies any previous surgery, previous infection of the mass.  He denies any drainage.  Discomfort and tenderness is localized to the right buttock area.  There is no pain radiation.  Aggravating factor is applying pressure and sitting down.  There has been no alleviating factors.     PROBLEM LIST: Problem List  Date Reviewed: 05/30/2020  None        GENERAL REVIEW OF SYSTEMS:    General ROS: negative for - chills, fatigue, fever, weight gain or weight loss Allergy and Immunology ROS: negative for - hives  Hematological and Lymphatic ROS: negative for - bleeding problems or bruising, negative for palpable nodes Endocrine ROS: negative for - heat or cold intolerance, hair changes Respiratory ROS: negative for - cough, shortness of breath or wheezing Cardiovascular ROS: no chest pain or palpitations GI ROS: negative for nausea, vomiting, abdominal pain, diarrhea, constipation Musculoskeletal ROS: negative for - joint swelling or muscle pain Neurological ROS: negative for - confusion, syncope Dermatological ROS: negative for pruritus and rash Psychiatric: negative for anxiety, depression, difficulty sleeping and memory loss   MEDICATIONS: Current Medications        Current Outpatient Medications  Medication Sig Dispense Refill   atorvastatin (LIPITOR) 40 MG tablet Take 40 mg by mouth once daily       clopidogreL (PLAVIX) 75 mg tablet Take 75 mg by mouth once daily       FUROsemide (LASIX) 40 MG tablet Take 40 mg by mouth once daily        gabapentin (NEURONTIN) 100 MG capsule Take 100 mg by mouth nightly       HYDROcodone-acetaminophen (NORCO) 5-325 mg tablet Take 1 tablet by mouth once daily as needed for Pain (only takes when the pain is extremely bad, not everyday)       ondansetron (ZOFRAN-ODT) 4 MG disintegrating tablet as needed       telmisartan (MICARDIS) 20 MG tablet once daily       traZODone (DESYREL) 50 MG tablet Take 50 mg by mouth nightly as needed for Sleep        No current facility-administered medications for this visit.        ALLERGIES: Benadryl [diphenhydramine hcl] and Tramadol   PAST MEDICAL HISTORY:     Past Medical History:  Diagnosis Date   Heart attack (CMS-HCC)     Hypertension        PAST SURGICAL HISTORY:      Past Surgical History:  Procedure Laterality Date   cardiac bypass graft   09/19/2019    [place] ---- double   REPLACEMENT AORTIC VALVE   09/19/2019    [place]      FAMILY HISTORY:      Family History  Problem Relation Age of Onset   High blood pressure (Hypertension) Mother     High blood pressure (Hypertension) Father     Lung cancer Father     High blood pressure (Hypertension) Brother     Lung cancer Brother  SOCIAL HISTORY: Social History           Socioeconomic History   Marital status: Married  Tobacco Use   Smoking status: Former Smoker      Years: 45.00      Quit date: 09/09/2019      Years since quitting: 0.8   Smokeless tobacco: Never Used  Vaping Use   Vaping Use: Never used  Substance and Sexual Activity   Alcohol use: Never   Drug use: Never        PHYSICAL EXAM:    Vitals:    07/11/20 1333  BP: 139/73  Pulse: 71    Body mass index is 26.04 kg/m. Weight: 66.7 kg (147 lb)    GENERAL: Alert, active, oriented x3   HEENT: Pupils equal reactive to light. Extraocular movements are intact. Sclera clear. Palpebral conjunctiva normal red color.Pharynx clear.   NECK: Supple with no palpable mass and no  adenopathy.   LUNGS: Sound clear with no rales rhonchi or wheezes.   HEART: Regular rhythm S1 and S2 without murmur.   ABDOMEN: Soft and depressible, nontender with no palpable mass, no hepatomegaly.    EXTREMITIES: Well-developed well-nourished symmetrical with no dependent edema.  Right hip/buttock area with soft tissue mass greater than 12 cm.  Rubbery, mobile.  No skin changes.   NEUROLOGICAL: Awake alert oriented, facial expression symmetrical, moving all extremities.   REVIEW OF DATA: I have reviewed the following data today: No visits with results within 3 Month(s) from this visit.  Latest known visit with results is:  No results found for any previous visit.      ASSESSMENT: Mr. Dean Obrien is a 60 y.o. male presenting for consultation for right hip/buttock soft tissue mass.     Patient with a soft tissue mass on the right buttock for more than 10 years.  Due to the mass increasing in size and getting more uncomfortable patient is requesting excision.  Due to the size of the mass and possibly at the place of the mass I would recommend to do the excision of the mass in the operation room.  Patient was oriented about the procedure.  Patient was oriented about the risk that includes infection, bleeding, seroma, pain, scar tissue, among others.   Patient has history of coronary artery disease.  He denies any chest pain.  No chest pain on exertion either.  He has been followed by Dr. Rockey Situ.  We will request cardiology clearance.   Palpable mass of soft tissue of pelvis [M79.89]   PLAN: Excision of right buttock soft tissue mass (21931) CBC, CMP Cardiology Clearance Hold Plavix 7 days before surgery Contact us if you have any concern.    Patient verbalized understanding, all questions were answered, and were agreeable with the plan outlined above.      Herbert Pun, MD   Electronically signed by Herbert Pun, MD

## 2020-08-17 ENCOUNTER — Inpatient Hospital Stay: Admission: RE | Admit: 2020-08-17 | Payer: Self-pay | Source: Ambulatory Visit

## 2020-08-22 ENCOUNTER — Other Ambulatory Visit: Payer: Self-pay

## 2020-08-22 ENCOUNTER — Encounter
Admission: RE | Admit: 2020-08-22 | Discharge: 2020-08-22 | Disposition: A | Discharge status: Home / Self Care | Source: Ambulatory Visit | Attending: General Surgery | Admitting: General Surgery

## 2020-08-22 ENCOUNTER — Encounter: Payer: Self-pay | Admitting: General Surgery

## 2020-08-22 NOTE — Patient Instructions (Signed)
Your procedure is scheduled [id number] 8/8 Report to Registration Desk in [facility] then to 2nd floor surgery desk. To find out your arrival time please call [phone] between 1PM - 3PM on Friday 8/5  Remember: Instructions that are not followed completely may result in serious medical risk,  up to and including death, or upon the discretion of your surgeon and anesthesiologist your  surgery may need to be rescheduled.     _X__ 1. Do not eat food after midnight the night before your procedure.                 No chewing gum or hard candies. You may drink clear liquids up to 2 hours                 before you are scheduled to arrive for your surgery- DO not drink clear                 liquids within 2 hours of the start of your surgery.                 Clear Liquids include:  water, apple juice without pulp, clear Gatorade, G2 or                  Gatorade Zero (avoid Red/Purple/Blue), Black Coffee or Tea (Do not add                 anything to coffee or tea). _____2.   Complete the "Ensure Clear Pre-surgery Clear Carbohydrate Drink"       provided to you, 2 hours before arrival. **If you are diabetic you will be       provided with an alternative drink, Gatorade Zero or G2.  __X__2.  On the morning of surgery brush your teeth with toothpaste and water, you                may rinse your mouth with mouthwash if you wish.  Do not swallow any         toothpaste of mouthwash.     ___ 3.  No Alcohol for 24 hours before or after surgery.   __ 4.  Do Not Smoke or use e-cigarettes For 24 Hours Prior to Your Surgery.                 Do not use any chewable tobacco products for at least 6 hours prior to                 Surgery.  ___  5.  Do not use any recreational drugs (marijuana, cocaine, heroin, ecstasy,       MDMA or other) For at least one week prior to your surgery.            Combination of these drugs with anesthesia may have life threatening        results.  ____  6.  Bring all medications with you on the day of surgery if instructed.   __x__  7.  Notify your doctor if there is any change in your medical condition      (cold, fever, infections).     Do not wear jewelry,  Do not wear lotions, You may wear deodorant. Do not shave 48 hours prior to surgery. Men may shave face and neck. Do not bring valuables to the hospital.    [facility] is not responsible for any belongings or valuables.  Contacts, dentures or bridgework may not  be worn into surgery. Leave your suitcase in the car. After surgery it may be brought to your room. For patients admitted to the hospital, discharge time is determined by your treatment team.   Patients discharged the day of surgery will not be allowed to drive home.   Make arrangements for someone to be with you for the first 24 hours of your Same Day Discharge.    Please read over the following fact sheets that you were given:       _x___ Take these medicines the morning of surgery with A SIP OF WATER:    1. acetaminophen (TYLENOL) 500 MG tablet if needed  2. atorvastatin (LIPITOR) 40 MG tablet  3. fluticasone (FLONASE) 50 MCG/ACT nasal spray  4.gabapentin (NEURONTIN) 100 MG capsule  5.loratadine (CLARITIN) 10 MG tablet  6.metoprolol tartrate (LOPRESSOR) 25 MG tablet             7.  ____ Fleet Enema (as directed)   __x__ Shower the night before surgery and morning of surgery.  ____ Use Benzoyl Peroxide Gel as instructed  ____ Use inhalers on the day of surgery  ____ Stop metformin 2 days prior to surgery    ____ Take 1/2 of usual insulin dose the night before surgery. No insulin the morning          of surgery.   __x__ Stopped Plavix/aspirin on 7/31 per MD instructions  ___    Stop Anti-inflammatories    ____ Stop supplements until after surgery.    ____ Bring C-Pap to the hospital.    If you have any questions regarding your pre-procedure instructions,  Please call  Pre-admit Testing at [phone]

## 2020-08-22 NOTE — Progress Notes (Signed)
Perioperative Services  Pre-Admission/Anesthesia Testing Clinical Review  Date: 08/23/20  Patient Demographics:  Name: Dean Obrien DOB:   Mar 11, 1960 MRN:   [id number]  Planned Surgical Procedure(s):    Case: [id number] Date/Time: 08/28/20 0730   Procedure: EXCISION MASS (Right: Buttocks)   Anesthesia type: General   Pre-op diagnosis: M79.89 Palpable mass of soft tissue of pelvis   Location: [facility] OR ROOM 02 / [place] ORS FOR ANESTHESIA GROUP   Surgeons: Herbert Pun, MD     NOTE: Available PAT nursing documentation and vital signs have been reviewed. Clinical nursing staff has updated patient's PMH/PSHx, current medication list, and drug allergies/intolerances to ensure comprehensive history available to assist in medical decision making as it pertains to the aforementioned surgical procedure and anticipated anesthetic course. Extensive review of available clinical information performed. Dean Obrien PMH and PSHx updated with any diagnoses/procedures that  may have been inadvertently omitted during his intake with the pre-admission testing department's nursing staff.  Clinical Discussion:  Dean Obrien is a 60 y.o. male who is submitted for pre-surgical anesthesia review and clearance prior to him undergoing the above procedure. Patient is a Former Smoker (quit 08/2019). Pertinent PMH includes: CAD, NSTEMI (s/p CABG), severe aortic valve stenosis (s/p AVR), aortic atherosclerosis, G2DD, HTN, HLD, prediabetes, pulmonary nodules, emphysema, lumbar spinal stenosis and spondylosis.  Patient is followed by cardiology Rockey Situ, MD). He was last seen in the cardiology clinic on 04/28/2020; notes reviewed.  At the time of his clinic visit, patient doing well overall from a cardiovascular perspective.  He denied any chest pain, shortness breath, PND, orthopnea, palpitations, significant peripheral edema, vertiginous symptoms, or presyncope/syncope PMH significant for cardiovascular  diagnoses.  Patient suffered an NSTEMI on 09/09/2019.  TTE revealed normal left ventricular systolic function with an EF of 55-60%. Diastolic parameters consistent with G1DD. There was mild MV regurgitation.  Aortic valve appeared heavily calcified, however regurgitation not visualized.  There was critical aortic valve stenosis noted; mean gradient 84.7 mmHg.  Patient underwent diagnostic right/left heart catheterization on 09/10/2019 that revealed single-vessel CAD. There was 20% stenosis noted in the proximal LCx, 50% stenosis in the mid LAD, and 90% stenosis of the proximal RCA.  Aortic valve severely calcified with restricted opening.  Filling pressures, PA pressure, and cardiac output normal.  Patient was transferred to [facility] for evaluation for possible CABG and AVR.  Patient underwent a two-vessel CABG on 09/14/2019.  LIMA-LAD and RIMA-distal RCA bypass grafts were placed.  At the same time, he had AVR replaced with a 25 mm Inspiris bioprosthetic valve. Intraoperative TEE revealed a normal EF and a well seated bioprosthetic valve with no evidence of perivalvular leak.  Of note, procedures were complicated by postoperative atrial fibrillation.  Patient was treated with an amiodarone drip converting him to NSR.  Last TTE performed on 11/02/2019 revealed a low normal left ventricular systolic function with an EF of 50-55%.  Doppler parameters consistent with G2DD.  Right ventricular systolic function moderately reduced.  There was borderline dilatation of the ascending aorta measuring 36 mm.  Bioprosthetic valve functioning well; mean gradient 8.0 mmHg (see full interpretation of cardiovascular testing and interventions below).  Patient on daily DAPT therapy (ASA + clopidogrel); compliant with therapy with no evidence of GI bleeding.  Blood pressure reasonably controlled at 130/82 on currently prescribed beta-blocker and ARB therapies.  Patient is on a statin for his HLD. Functional capacity, as  defined by DASI, is documented as being >/= 4 METS.  No changes were made to patient's  medication regimen.  Patient to follow-up with outpatient cardiology in 6 months or sooner if needed.  Patient is scheduled for an elective soft tissue mass excision on 08/28/2020 with Dr. Herbert Pun, MD. Given patient's past medical history significant for cardiovascular diagnoses, presurgical cardiac clearance was sought by the performing surgeon's office and PAT team. Per cardiology, "based on patient's past medical history and time since his last clinic visit, patient would be at an overall ACCEPTABLE risk for the planned procedure without further cardiovascular testing or intervention at this time". Again, this patient is on daily DAPT therapy. He has been instructed on recommendations for holding his clopidogrel for 7 days prior to his procedure with plans to restart as soon as postoperative bleeding risk felt to be minimized by his attending surgeon. The patient has been instructed that his last dose of his anticoagulant will be on 08/20/2020.  The patient will continue his daily low-dose ASA throughout the perioperative period.  Patient denies previous perioperative complications with anesthesia in the past. In review of the available records, it is noted that patient underwent a general anesthetic course at [facility] (ASA IV) in 08/2019 without documented complications.  Of note, PMH significant for lower back pain secondary to disc disease.  Results from most recent MRI imaging of the spine included for review by anesthesia team in the event that neuraxial anesthetic course is considered as part of patient's procedure plan of care.  Vitals with BMI 08/22/2020 07/05/2020 04/28/2020  Height '5\' 3"'$  '5\' 3"'$  '5\' 3"'$   Weight 154 lbs 5 oz 154 lbs 6 oz 150 lbs 6 oz  BMI 27.34 [id number]  Systolic - [id number]  Diastolic - 80 82  Pulse - 60 59    Providers/Specialists:   NOTE: Primary physician provider  listed below. Patient may have been seen by APP or partner within same practice.   PROVIDER ROLE / SPECIALTY LAST Tanna Savoy, MD General Surgery 08/09/2020  Crecencio Mc, MD Primary Care Provider 07/05/2020  Ida Rogue, MD Cardiology 04/28/2020   Allergies:  Tramadol and Benadryl [diphenhydramine]  Current Home Medications:   No current facility-administered medications for this encounter.    acetaminophen (TYLENOL) 500 MG tablet   aspirin EC 81 MG EC tablet   atorvastatin (LIPITOR) 40 MG tablet   clopidogrel (PLAVIX) 75 MG tablet   fluticasone (FLONASE) 50 MCG/ACT nasal spray   gabapentin (NEURONTIN) 100 MG capsule   HYDROcodone-acetaminophen (NORCO) 10-325 MG tablet   loratadine (CLARITIN) 10 MG tablet   metoprolol tartrate (LOPRESSOR) 25 MG tablet   naphazoline-pheniramine (ALLERGY EYE) 0.025-0.3 % ophthalmic solution   telmisartan (MICARDIS) 20 MG tablet   traZODone (DESYREL) 50 MG tablet   ondansetron (ZOFRAN ODT) 4 MG disintegrating tablet   History:   Past Medical History:  Diagnosis Date   Aortic atherosclerosis (HCC)    Aortic stenosis, severe 09/09/2018   a.) RHC 09/10/19 Heavily calcified AV. VTI 0.42 cm. AV mean gradient 84.7 mmHg. Vmax measures 5.51 m/s. nl filling pressures, nl pulmonary pressure, nl CO; b.) underwent AVR (25 mm Inspiris-Resilia valve) on 09/14/2019   CAD (coronary artery disease)    LHC 09/10/2019: pRCA 90%s, pLCx 20%s, mLAD 50%s   Emphysema, unspecified (HCC)    Grade II diastolic dysfunction    Hx of CABG 09/14/2019   LIMA-LAD and RIMA-dRCA   Hyperlipidemia LDL goal <70    8/19 LDL 87   Hypertension    Lumbar spinal stenosis    Lumbar spondylosis  Multiple pulmonary nodules determined by computed tomography of lung    NSTEMI (non-ST elevated myocardial infarction) ([place]) 09/09/2019   Postoperative atrial fibrillation ([place]) 09/14/2019   Tx'd with amiodarone gtt; converted to NSR   Prediabetes    Past Surgical  History:  Procedure Laterality Date   AORTIC VALVE REPLACEMENT N/A 09/14/2019   Procedure: AORTIC VALVE REPLACEMENT (AVR) USING INSPIRIS VALVE SIZE 25MM;  Surgeon: Wonda Olds, MD;  Location: [place];  Service: Open Heart Surgery;  Laterality: N/A;   CORONARY ARTERY BYPASS GRAFT N/A 09/14/2019   Procedure: CORONARY ARTERY BYPASS GRAFTING (CABG); LIMA-LAD and RIMA-dRCA;  Surgeon: Wonda Olds, MD;  Location: [place];  Service: Open Heart Surgery;  Laterality: N/A;   RIGHT/LEFT HEART CATH AND CORONARY ANGIOGRAPHY N/A 09/10/2019   Procedure: RIGHT/LEFT HEART CATH AND CORONARY ANGIOGRAPHY;  Surgeon: Wellington Hampshire, MD;  Location: [place] CV LAB;  Service: Cardiovascular;  Laterality: N/A;   TEE WITHOUT CARDIOVERSION N/A 09/14/2019   Procedure: TRANSESOPHAGEAL ECHOCARDIOGRAM (TEE);  Surgeon: Wonda Olds, MD;  Location: [place];  Service: Open Heart Surgery;  Laterality: N/A;   Family History  Problem Relation Age of Onset   Emphysema Mother    Liver disease Mother    Lung cancer Father    Hypertension Sister    Hypertension Brother    Social History   Tobacco Use   Smoking status: Former    Packs/day: 1.50    Years: 40.00    Pack years: 60.00    Types: Cigarettes    Quit date: 09/08/2019    Years since quitting: 0.9   Smokeless tobacco: Never  Vaping Use   Vaping Use: Never used  Substance Use Topics   Alcohol use: No   Drug use: No    Pertinent Clinical Results:  LABS: Labs reviewed: Acceptable for surgery.   Ref Range & Units 07/11/2020  WBC (White Blood Cell Count) 4.1 - 10.2 10^3/uL 8.5   RBC (Red Blood Cell Count) 4.69 - 6.13 10^6/uL 4.49 Low    Hemoglobin 14.1 - 18.1 gm/dL 14.1   Hematocrit 40.0 - 52.0 % 42.0   MCV (Mean Corpuscular Volume) 80.0 - 100.0 fl 93.5   MCH (Mean Corpuscular Hemoglobin) 27.0 - 31.2 pg 31.4 High    MCHC (Mean Corpuscular Hemoglobin Concentration) 32.0 - 36.0 gm/dL 33.6   Platelet Count 150 - 450 10^3/uL 190   RDW-CV (Red  Cell Distribution Width) 11.6 - 14.8 % 12.6   MPV (Mean Platelet Volume) 9.4 - 12.4 fl 10.9   Neutrophils 1.50 - 7.80 10^3/uL 4.80   Lymphocytes 1.00 - 3.60 10^3/uL 2.37   Monocytes 0.00 - 1.50 10^3/uL 1.02   Eosinophils 0.00 - 0.55 10^3/uL 0.23   Basophils 0.00 - 0.09 10^3/uL 0.07   Neutrophil % 32.0 - 70.0 % 56.3   Lymphocyte % 10.0 - 50.0 % 27.8   Monocyte % 4.0 - 13.0 % 12.0   Eosinophil % 1.0 - 5.0 % 2.7   Basophil% 0.0 - 2.0 % 0.8   Immature Granulocyte % <=0.7 % 0.4   Immature Granulocyte Count <=0.06 10^3/L 0.03   Resulting Agency  [facility] - [facility]  Specimen Collected: 07/11/20 14:45 Last Resulted: 07/11/20 15:42  Received From: [place]  Result Received: 07/13/20 08:13   Component Date Value Ref Range Status   Sodium 07/05/2020 138  135 - 145 mEq/L Final   Potassium 07/05/2020 4.1  3.5 - 5.1 mEq/L Final   Chloride 07/05/2020 102  96 - 112 mEq/L Final   CO2 07/05/2020 27  19 - 32 mEq/L Final   Glucose, Bld 07/05/2020 115 (A) 70 - 99 mg/dL Final   BUN 07/05/2020 18  6 - 23 mg/dL Final   Creatinine, Ser 07/05/2020 0.71  0.40 - 1.50 mg/dL Final   Total Bilirubin 07/05/2020 0.8  0.2 - 1.2 mg/dL Final   Alkaline Phosphatase 07/05/2020 57  39 - 117 U/L Final   AST 07/05/2020 15  0 - 37 U/L Final   ALT 07/05/2020 23  0 - 53 U/L Final   Total Protein 07/05/2020 7.1  6.0 - 8.3 g/dL Final   Albumin 07/05/2020 4.4  3.5 - 5.2 g/dL Final   GFR 07/05/2020 100.83  >60.00 mL/min Final   Calculated using the CKD-EPI Creatinine Equation (2021)   Calcium 07/05/2020 9.5  8.4 - 10.5 mg/dL Final    ECG: Date: 04/28/2020 Time ECG obtained: 0934 AM Rate: 59 bpm Rhythm: sinus bradycardia Axis (leads I and aVF): Normal Intervals: PR 180 ms. QRS 88 ms. QTc 399 ms. ST segment and T wave changes: No evidence of acute ST segment elevation or depression Comparison: Similar to previous tracing obtained on 10/01/2019   IMAGING / PROCEDURES: CT CHEST WITHOUT  CONTRAST performed on 04/25/2020 Scattered small pulmonary nodules are unchanged or minimally decreased in size from 09/12/2019. Consider ongoing evaluation with annual low-dose lung cancer screening CT. Aortic atherosclerosis Emphysema  MRI LUMBAR SPINE WITHOUT CONTRAST performed on 04/15/2020 Similar degree of multilevel lumbar spondylosis. Findings are most pronounced at L4-5 where there is a prominent left foraminal/extraforaminal disc herniation resulting in severe left foraminal stenosis and mild-moderate right foraminal stenosis. Correlate for radicular symptoms within the left L4 nerve root distribution. Moderate right and mild left foraminal stenosis at L3-4. Moderate left and mild right foraminal stenosis at L5-S1. No canal stenosis at any level.  TRANSTHORACIC ECHOCARDIOGRAM performed on 11/02/2019 Left ventricular ejection fraction, by estimation, is 50 to 55%. The left ventricle has low normal function. The left ventricle has no regional  wall motion abnormalities. Left ventricular diastolic parameters are consistent with Grade II diastolic dysfunction (pseudonormalization). Elevated left atrial pressure.  Right ventricular systolic function is moderately reduced. The right ventricular size is normal.  A small pericardial effusion is present. The pericardial effusion is circumferential.  The mitral valve is normal in structure. Trivial mitral valve regurgitation. No evidence of mitral stenosis.  The aortic valve has been repaired/replaced. Aortic valve regurgitation is not visualized. No aortic stenosis is present. There is a 25 mm Edwards Inspiris-Resilia valve present in the aortic position. Procedure Date: 09/14/19.  There is borderline dilatation of the ascending aorta, measuring 36 mm.  The inferior vena cava is normal in size with greater than 50% respiratory variability, suggesting right atrial pressure of 3 mmHg.   INTRAOPERATIVE TRANSESOPHAGEAL ECHOCARDIOGRAM performed on  09/14/2019 Limited post CPB exam: Left Ventricle:  The left ventricle appears unchanged from pre-bypass.  There are no wall motion abnormalities.  The overall EF remains normal, 55-60%.  Aortic Valve:  A pericardial bioprosthetic valve was placed, leaflets are freely mobile and function well.  No regurgitation post repair.  There is no aortic stenosis, regurgitation, or perivalvular leak.  Mitral Valve:  The mitral valve appears unchanged from pre-bypass images.   AVR AND CORONARY ARTERY BYPASS GRAFTING procedure performed on 09/14/2019 Two-vessel CABG procedure LIMA-LAD LIMA-distal RCA AVR 25 mm Inspiris bioprosthetic valve placed  RIGHT/LEFT HEART CATHETERIZATION AND CORONARY ANGIOGRAPHY performed on 09/10/2019 Significant one-vessel CAD with  moderate to heavy calcifications 20% stenosis of the proximal LCx 50% stenosis at the mid LAD 90% stenosis of the proximal RCA Severely calcified aortic valve with restricted opening Right heart catheterization showed normal filling pressures, normal pulmonary pressure, and normal cardiac output Recommendations: Transfer to [facility] for consultation with CVTS for consideration of AVR and CABG procedure  TRANSTHORACIC ECHOCARDIOGRAM performed on 09/09/2019 Left ventricular ejection fraction, by estimation, is 55 to 60%. The left ventricle has normal function. The left ventricle has no regional  wall motion abnormalities. There is moderate left ventricular hypertrophy. Left ventricular diastolic parameters are consistent with Grade I diastolic dysfunction (impaired relaxation).  Right ventricular systolic function is normal. The right ventricular size is normal. There is normal pulmonary artery systolic pressure. The  estimated right ventricular systolic pressure is [id number] mmHg.  Mild mitral valve regurgitation.  The aortic valve was not well visualized. Appears heavily calcified. Aortic valve regurgitation is not visualized. Critical aortic  valve stenosis. Aortic valve area, by VTI measures 0.42 cm. Aortic valve mean gradient measures 84.7 mmHg. Aortic valve Vmax measures 5.51 m/s.   Impression and Plan:  Keval Wolske has been referred for pre-anesthesia review and clearance prior to him undergoing the planned anesthetic and procedural courses. Available labs, pertinent testing, and imaging results were personally reviewed by me. This patient has been appropriately cleared by cardiology with an overall ACCEPTABLE risk of significant perioperative cardiovascular complications.  Based on clinical review performed today (08/23/20), barring any significant acute changes in the patient's overall condition, it is anticipated that he will be able to proceed with the planned surgical intervention. Any acute changes in clinical condition may necessitate his procedure being postponed and/or cancelled. Patient will meet with anesthesia team (MD and/or CRNA) on the day of his procedure for preoperative evaluation/assessment. Questions regarding anesthetic course will be fielded at that time.   Pre-surgical instructions were reviewed with the patient during his PAT appointment and questions were fielded by PAT clinical staff. Patient was advised that if any questions or concerns arise prior to his procedure then he should return a call to PAT and/or his surgeon's office to discuss.  Honor Loh, MSN, APRN, FNP-C, CEN [facility]  Peri-operative Services Nurse Practitioner Phone: [phone] Fax: [phone] 08/23/20 9:40 AM  NOTE: This note has been prepared using Dragon dictation software. Despite my best ability to proofread, there is always the potential that unintentional transcriptional errors may still occur from this process.

## 2020-08-23 ENCOUNTER — Encounter: Payer: Self-pay | Admitting: General Surgery

## 2020-08-27 MED ORDER — FAMOTIDINE 20 MG PO TABS: 20.0000 mg | ORAL_TABLET | Freq: Once | ORAL | Status: AC

## 2020-08-27 MED ORDER — CHLORHEXIDINE GLUCONATE 0.12 % MT SOLN: 15.0000 mL | Freq: Once | OROMUCOSAL | Status: AC

## 2020-08-27 MED ORDER — ORAL CARE MOUTH RINSE: 15.0000 mL | Freq: Once | OROMUCOSAL | Status: AC

## 2020-08-27 MED ORDER — LACTATED RINGERS IV SOLN: INTRAVENOUS | Status: DC

## 2020-08-27 MED ORDER — CEFAZOLIN SODIUM-DEXTROSE 2-4 GM/100ML-% IV SOLN
2.0000 g | INTRAVENOUS | Status: AC
  Administered 2020-08-28: 2 g via INTRAVENOUS

## 2020-08-28 ENCOUNTER — Other Ambulatory Visit: Payer: Self-pay

## 2020-08-28 ENCOUNTER — Encounter
Admission: RE | Disposition: A | Discharge status: Home / Self Care | Payer: Self-pay | Source: Home / Self Care | Attending: General Surgery

## 2020-08-28 ENCOUNTER — Ambulatory Visit
Admission: RE | Admit: 2020-08-28 | Discharge: 2020-08-28 | Disposition: A | Discharge status: Home / Self Care | Attending: General Surgery | Admitting: General Surgery

## 2020-08-28 ENCOUNTER — Ambulatory Visit: Admitting: Urgent Care

## 2020-08-28 ENCOUNTER — Encounter: Payer: Self-pay | Admitting: General Surgery

## 2020-08-28 DIAGNOSIS — R222 Localized swelling, mass and lump, trunk: Secondary | ICD-10-CM | POA: Diagnosis present

## 2020-08-28 DIAGNOSIS — Z888 Allergy status to other drugs, medicaments and biological substances status: Secondary | ICD-10-CM | POA: Insufficient documentation

## 2020-08-28 DIAGNOSIS — Z87891 Personal history of nicotine dependence: Secondary | ICD-10-CM | POA: Insufficient documentation

## 2020-08-28 DIAGNOSIS — Z8249 Family history of ischemic heart disease and other diseases of the circulatory system: Secondary | ICD-10-CM | POA: Diagnosis not present

## 2020-08-28 DIAGNOSIS — L720 Epidermal cyst: Secondary | ICD-10-CM | POA: Insufficient documentation

## 2020-08-28 DIAGNOSIS — Z801 Family history of malignant neoplasm of trachea, bronchus and lung: Secondary | ICD-10-CM | POA: Insufficient documentation

## 2020-08-28 DIAGNOSIS — Z885 Allergy status to narcotic agent status: Secondary | ICD-10-CM | POA: Insufficient documentation

## 2020-08-28 DIAGNOSIS — Z951 Presence of aortocoronary bypass graft: Secondary | ICD-10-CM | POA: Insufficient documentation

## 2020-08-28 DIAGNOSIS — Z79899 Other long term (current) drug therapy: Secondary | ICD-10-CM | POA: Diagnosis not present

## 2020-08-28 DIAGNOSIS — Z7902 Long term (current) use of antithrombotics/antiplatelets: Secondary | ICD-10-CM | POA: Diagnosis not present

## 2020-08-28 HISTORY — DX: Other nonspecific abnormal finding of lung field: R91.8

## 2020-08-28 HISTORY — DX: Spinal stenosis, lumbar region without neurogenic claudication: M48.061

## 2020-08-28 HISTORY — PX: MASS EXCISION: SHX2000

## 2020-08-28 HISTORY — DX: Spondylosis without myelopathy or radiculopathy, lumbar region: M47.816

## 2020-08-28 HISTORY — DX: Other ill-defined heart diseases: I51.89

## 2020-08-28 HISTORY — DX: Atherosclerosis of aorta: I70.0

## 2020-08-28 HISTORY — DX: Emphysema, unspecified: J43.9

## 2020-08-28 SURGERY — EXCISION MASS
Anesthesia: General | Site: Buttocks | Laterality: Right | Wound class: Clean

## 2020-08-28 MED ORDER — CEFAZOLIN SODIUM-DEXTROSE 2-4 GM/100ML-% IV SOLN
INTRAVENOUS | Status: AC
  Filled 2020-08-28: qty 100

## 2020-08-28 MED ORDER — PROPOFOL 10 MG/ML IV BOLUS
INTRAVENOUS | Status: AC
  Filled 2020-08-28: qty 20

## 2020-08-28 MED ORDER — ACETAMINOPHEN 10 MG/ML IV SOLN: 1000.0000 mg | Freq: Once | INTRAVENOUS | Status: DC | PRN

## 2020-08-28 MED ORDER — OXYCODONE HCL 5 MG PO TABS
5.0000 mg | ORAL_TABLET | Freq: Once | ORAL | Status: DC | PRN
Start: 2020-08-28 — End: 2020-08-28

## 2020-08-28 MED ORDER — BUPIVACAINE-EPINEPHRINE 0.5% -1:200000 IJ SOLN
INTRAMUSCULAR | Status: DC | PRN
  Administered 2020-08-28: 30 mL

## 2020-08-28 MED ORDER — OXYCODONE HCL 5 MG/5ML PO SOLN: 5.0000 mg | Freq: Once | ORAL | Status: DC | PRN

## 2020-08-28 MED ORDER — FENTANYL CITRATE (PF) 100 MCG/2ML IJ SOLN: 25.0000 ug | INTRAMUSCULAR | Status: DC | PRN

## 2020-08-28 MED ORDER — FAMOTIDINE 20 MG PO TABS
ORAL_TABLET | ORAL | Status: AC
  Administered 2020-08-28: 20 mg via ORAL
  Filled 2020-08-28: qty 1

## 2020-08-28 MED ORDER — PROPOFOL 10 MG/ML IV BOLUS
INTRAVENOUS | Status: DC | PRN
  Administered 2020-08-28: 20 mg via INTRAVENOUS
  Administered 2020-08-28: 150 mg via INTRAVENOUS
  Administered 2020-08-28: 30 mg via INTRAVENOUS

## 2020-08-28 MED ORDER — PHENYLEPHRINE HCL (PRESSORS) 10 MG/ML IV SOLN
INTRAVENOUS | Status: AC
  Filled 2020-08-28: qty 1

## 2020-08-28 MED ORDER — GLYCOPYRROLATE 0.2 MG/ML IJ SOLN
INTRAMUSCULAR | Status: AC
  Filled 2020-08-28: qty 1

## 2020-08-28 MED ORDER — FENTANYL CITRATE (PF) 100 MCG/2ML IJ SOLN
INTRAMUSCULAR | Status: DC | PRN
  Administered 2020-08-28: 100 ug via INTRAVENOUS

## 2020-08-28 MED ORDER — CHLORHEXIDINE GLUCONATE 0.12 % MT SOLN
OROMUCOSAL | Status: AC
  Administered 2020-08-28: 15 mL via OROMUCOSAL
  Filled 2020-08-28: qty 15

## 2020-08-28 MED ORDER — MIDAZOLAM HCL 2 MG/2ML IJ SOLN
INTRAMUSCULAR | Status: DC | PRN
  Administered 2020-08-28: 2 mg via INTRAVENOUS

## 2020-08-28 MED ORDER — EPHEDRINE 5 MG/ML INJ
INTRAVENOUS | Status: AC
  Filled 2020-08-28: qty 5

## 2020-08-28 MED ORDER — PHENYLEPHRINE HCL (PRESSORS) 10 MG/ML IV SOLN
INTRAVENOUS | Status: DC | PRN
  Administered 2020-08-28 (×7): 100 ug via INTRAVENOUS

## 2020-08-28 MED ORDER — SUCCINYLCHOLINE CHLORIDE 200 MG/10ML IV SOSY
PREFILLED_SYRINGE | INTRAVENOUS | Status: DC | PRN
  Administered 2020-08-28: 70 mg via INTRAVENOUS

## 2020-08-28 MED ORDER — LIDOCAINE HCL (PF) 2 % IJ SOLN
INTRAMUSCULAR | Status: AC
  Filled 2020-08-28: qty 5

## 2020-08-28 MED ORDER — ONDANSETRON HCL 4 MG/2ML IJ SOLN
INTRAMUSCULAR | Status: AC
  Filled 2020-08-28: qty 2

## 2020-08-28 MED ORDER — FENTANYL CITRATE (PF) 100 MCG/2ML IJ SOLN
INTRAMUSCULAR | Status: AC
  Filled 2020-08-28: qty 2

## 2020-08-28 MED ORDER — EPHEDRINE SULFATE 50 MG/ML IJ SOLN
INTRAMUSCULAR | Status: DC | PRN
Start: 2020-08-28 — End: 2020-08-28
  Administered 2020-08-28: 10 mg via INTRAVENOUS
  Administered 2020-08-28: 5 mg via INTRAVENOUS
  Administered 2020-08-28: 10 mg via INTRAVENOUS

## 2020-08-28 MED ORDER — BUPIVACAINE-EPINEPHRINE (PF) 0.5% -1:200000 IJ SOLN
INTRAMUSCULAR | Status: AC
  Filled 2020-08-28: qty 30

## 2020-08-28 MED ORDER — HYDROCODONE-ACETAMINOPHEN 5-325 MG PO TABS: 1.0000 | ORAL_TABLET | ORAL | 0 refills | Status: AC | PRN

## 2020-08-28 MED ORDER — LIDOCAINE HCL (CARDIAC) PF 100 MG/5ML IV SOSY
PREFILLED_SYRINGE | INTRAVENOUS | Status: DC | PRN
  Administered 2020-08-28: 70 mg via INTRAVENOUS

## 2020-08-28 MED ORDER — GLYCOPYRROLATE 0.2 MG/ML IJ SOLN
INTRAMUSCULAR | Status: DC | PRN
Start: 2020-08-28 — End: 2020-08-28
  Administered 2020-08-28: .2 mg via INTRAVENOUS

## 2020-08-28 MED ORDER — MIDAZOLAM HCL 2 MG/2ML IJ SOLN
INTRAMUSCULAR | Status: AC
  Filled 2020-08-28: qty 2

## 2020-08-28 SURGICAL SUPPLY — 34 items
ADH SKN CLS APL DERMABOND .7 (GAUZE/BANDAGES/DRESSINGS) ×1
APL PRP STRL LF DISP 70% ISPRP (MISCELLANEOUS) ×1
BLADE SURG 15 STRL LF DISP TIS (BLADE) ×1 IMPLANT
BLADE SURG 15 STRL SS (BLADE) ×2
CHLORAPREP W/TINT 26 (MISCELLANEOUS) ×2 IMPLANT
CNTNR SPEC 2.5X3XGRAD LEK (MISCELLANEOUS) ×1
CONT SPEC 4OZ STER OR WHT (MISCELLANEOUS) ×1
CONT SPEC 4OZ STRL OR WHT (MISCELLANEOUS) ×1
CONTAINER SPEC 2.5X3XGRAD LEK (MISCELLANEOUS) ×1 IMPLANT
DERMABOND ADVANCED (GAUZE/BANDAGES/DRESSINGS) ×1
DERMABOND ADVANCED .7 DNX12 (GAUZE/BANDAGES/DRESSINGS) ×1 IMPLANT
DRAPE LAPAROTOMY 100X77 ABD (DRAPES) ×2 IMPLANT
DRAPE UNDER BUTTOCK W/FLU (DRAPES) IMPLANT
ELECT REM PT RETURN 9FT ADLT (ELECTROSURGICAL) ×2
ELECTRODE REM PT RTRN 9FT ADLT (ELECTROSURGICAL) ×1 IMPLANT
GAUZE 4X4 16PLY ~~LOC~~+RFID DBL (SPONGE) ×2 IMPLANT
GLOVE SURG ENC MOIS LTX SZ6.5 (GLOVE) ×8 IMPLANT
GLOVE SURG UNDER POLY LF SZ6.5 (GLOVE) ×8 IMPLANT
KIT TURNOVER KIT A (KITS) ×2 IMPLANT
LABEL OR SOLS (LABEL) ×2 IMPLANT
MANIFOLD NEPTUNE II (INSTRUMENTS) ×2 IMPLANT
MARGIN MAP 10MM (MISCELLANEOUS) ×2 IMPLANT
NEEDLE HYPO 25X1 1.5 SAFETY (NEEDLE) ×2 IMPLANT
NS IRRIG 500ML POUR BTL (IV SOLUTION) ×2 IMPLANT
PACK BASIN MINOR ARMC (MISCELLANEOUS) ×2 IMPLANT
SUT ETHILON 3-0 (SUTURE) ×2 IMPLANT
SUT MNCRL 4-0 (SUTURE) ×2
SUT MNCRL 4-0 27XMFL (SUTURE) ×1
SUT VIC AB 2-0 SH 27 (SUTURE) ×2
SUT VIC AB 2-0 SH 27XBRD (SUTURE) ×1 IMPLANT
SUT VIC AB 3-0 SH 27 (SUTURE) ×2
SUT VIC AB 3-0 SH 27X BRD (SUTURE) ×1 IMPLANT
SUTURE MNCRL 4-0 27XMF (SUTURE) ×1 IMPLANT
SYR 10ML LL (SYRINGE) ×2 IMPLANT

## 2020-08-28 NOTE — Anesthesia Preprocedure Evaluation (Addendum)
Anesthesia Evaluation  Patient identified by MRN, date of birth, ID band Patient awake    Reviewed: Allergy & Precautions, NPO status , Patient's Chart, lab work & pertinent test results, reviewed documented beta blocker date and time   History of Anesthesia Complications Negative for: history of anesthetic complications  Airway Mallampati: II  TM Distance: >3 FB Neck ROM: Full    Dental  (+) Dental Advisory Given, Poor Dentition,    Pulmonary neg pulmonary ROS, Patient abstained from smoking., former smoker,  09/09/2019 SARS coronavirus NEG   breath sounds clear to auscultation       Cardiovascular Exercise Tolerance: Good hypertension, Pt. on medications and Pt. on home beta blockers (-) angina+ CAD (s/p 2v CABG), + Past MI, + CABG (LIMA-LAD and RIMA-dRCA 2021) and +CHF (Grade II DD)  (-) DOE + Valvular Problems/Murmurs (s/p AVR)  Rhythm:Regular Rate:Normal + Systolic murmurs ECHO Q000111Q: IMPRESSIONS  1. Left ventricular ejection fraction, by estimation, is 50 to 55%. The  left ventricle has low normal function. The left ventricle has no regional  wall motion abnormalities. Left ventricular diastolic parameters are  consistent with Grade II diastolic  dysfunction (pseudonormalization). Elevated left atrial pressure.  2. Right ventricular systolic function is moderately reduced. The right  ventricular size is normal.  3. A small pericardial effusion is present. The pericardial effusion is  circumferential.  4. The mitral valve is normal in structure. Trivial mitral valve  regurgitation. No evidence of mitral stenosis.  5. The aortic valve has been repaired/replaced. Aortic valve  regurgitation is not visualized. No aortic stenosis is present. There is a  25 mm Edwards Inspiris-Resilia valve present in the aortic position.  Procedure Date: 09/14/19.  6. There is borderline dilatation of the ascending aorta, measuring 36   mm.  7. The inferior vena cava is normal in size with greater than 50%  respiratory variability, suggesting right atrial pressure of 3 mmHg.   Neuro/Psych  Neuromuscular disease (Sciatica Left leg) negative psych ROS   GI/Hepatic negative GI ROS, Neg liver ROS,   Endo/Other  negative endocrine ROS  Renal/GU negative Renal ROS     Musculoskeletal  (+) Arthritis , Osteoarthritis,    Abdominal (+) + obese,   Peds  Hematology negative hematology ROS (+)   Anesthesia Other Findings   Reproductive/Obstetrics                            Anesthesia Physical  Anesthesia Plan  ASA: 3  Anesthesia Plan: General   Post-op Pain Management:    Induction: Intravenous  PONV Risk Score and Plan: 1 and Treatment may vary due to age or medical condition, Ondansetron and Dexamethasone  Airway Management Planned: Oral ETT  Additional Equipment:   Intra-op Plan:   Post-operative Plan: Extubation in OR  Informed Consent: I have reviewed the patients History and Physical, chart, labs and discussed the procedure including the risks, benefits and alternatives for the proposed anesthesia with the patient or authorized representative who has indicated his/her understanding and acceptance.     Dental advisory given  Plan Discussed with: CRNA and Anesthesiologist  Anesthesia Plan Comments:        Anesthesia Quick Evaluation

## 2020-08-28 NOTE — Interval H&P Note (Signed)
History and Physical Interval Note:  08/28/2020 6:57 AM  Dean Obrien  has presented today for surgery, with the diagnosis of M79.89 Palpable mass of soft tissue of pelvis.  The various methods of treatment have been discussed with the patient and family. After consideration of risks, benefits and other options for treatment, the patient has consented to  Procedure(s): EXCISION MASS (Right) as a surgical intervention.  The patient's history has been reviewed, patient examined, no change in status, stable for surgery.  I have reviewed the patient's chart and labs.  Questions were answered to the patient's satisfaction.     Herbert Pun

## 2020-08-28 NOTE — Discharge Instructions (Addendum)
  Diet: Resume home heart healthy regular diet.   Activity: Increase activity as tolerated. Light activity and walking are encouraged. Do not drive or drink alcohol if taking narcotic pain medications.  Avoid pressure on the right buttock area.   Wound care: May shower with soapy water and pat dry (do not rub incisions), but no baths or submerging incision underwater until follow-up. (no swimming)   Medications: Resume all home medications. For mild to moderate pain: acetaminophen (Tylenol) or ibuprofen (if no kidney disease). Combining Tylenol with alcohol can substantially increase your risk of causing liver disease. Narcotic pain medications, if prescribed, can be used for severe pain, though may cause nausea, constipation, and drowsiness. Do not combine Tylenol and Norco within a 6 hour period as Norco contains Tylenol. If you do not need the narcotic pain medication, you do not need to fill the prescription.  Call office [phone]) at any time if any questions, worsening pain, fevers/chills, bleeding, drainage from incision site, or other concerns.   AMBULATORY SURGERY  DISCHARGE INSTRUCTIONS   The drugs that you were given will stay in your system until tomorrow so for the next 24 hours you should not:  Drive an automobile Make any legal decisions Drink any alcoholic beverage   You may resume regular meals tomorrow.  Today it is better to start with liquids and gradually work up to solid foods.  You may eat anything you prefer, but it is better to start with liquids, then soup and crackers, and gradually work up to solid foods.   Please notify your doctor immediately if you have any unusual bleeding, trouble breathing, redness and pain at the surgery site, drainage, fever, or pain not relieved by medication.    Additional Instructions:        Please contact your physician with any problems or Same Day Surgery at [phone], Monday through Friday 6 am to 4 pm, or  [place] at [facility] number at [phone].

## 2020-08-28 NOTE — Anesthesia Procedure Notes (Signed)
Procedure Name: Intubation Date/Time: 08/28/2020 7:33 AM Performed by: Rona Ravens, CRNA Pre-anesthesia Checklist: Patient identified, Patient being monitored, Timeout performed, Emergency Drugs available and Suction available Patient Re-evaluated:Patient Re-evaluated prior to induction Oxygen Delivery Method: Circle system utilized Preoxygenation: Pre-oxygenation with 100% oxygen Induction Type: IV induction Ventilation: Mask ventilation without difficulty Laryngoscope Size: Mac, 3 and McGraph Grade View: Grade I Tube type: Oral Tube size: 7.0 mm Number of attempts: 1 Airway Equipment and Method: Stylet Placement Confirmation: ETT inserted through vocal cords under direct vision, positive ETCO2 and breath sounds checked- equal and bilateral Secured at: 21 cm Tube secured with: Tape Dental Injury: Teeth and Oropharynx as per pre-operative assessment

## 2020-08-28 NOTE — Op Note (Signed)
OPERATION REPORT  Pre Operative Diagnosis: Right pelvic soft tissue mass  Post operative diagnosis: Right pelvic soft tissue mass   Procedure: Excision of soft tissue mass                    Rearrangement of tissue for wound closure.  Anesthesia: General and local   Surgeon: Dr. Windell Moment   Indication: This 60 y.o. year old male with a left soft tissue mass in the right pelvic area.  He has been growing for the last years.  Started to get extremely uncomfortable due to size.   Description of procedure: after orienting patient about the procedure steps and benefits and patient agreed to proceed. Time out was done identifying correct patient and location of procedure.  The patient was placed in left lateral decubitus position.  Right hip was prepped and draped in the usual sterile fashion.  Local anesthesia was infiltrated around the palpable lesion. With a blade #15, an elliptical incision was made using the skin lines. Sharp dissection was carried down and lesion was excised including dermal tissue down to the muscle. The mass measured 12 cm x 10 cm.  The wound was then approached for closure.  Eliminate dead space a tissue transfer technique was utilized.  The fatty layer and fascia was elevated off the underlying muscle circumferentially for a distance of about 6 x6 centimeters (area: 36 cm).  The fascial layer was then approximated with interrupted 2-0 Vicryl sutures.  The superficial layer was then approximated in a similar fashion.  This was done in a radial direction.  The skin flaps were then elevated circumferentially to remove a ripple noted superiorly and medially. The skin was closed with 4-0 Monocryl. Dermabond was applied.   Complications: none   EBL: minimal

## 2020-08-28 NOTE — Transfer of Care (Signed)
Immediate Anesthesia Transfer of Care Note  Patient: Dean Obrien  Procedure(s) Performed: EXCISION MASS (Right: Buttocks)  Patient Location: PACU  Anesthesia Type:General  Level of Consciousness: awake, alert  and oriented  Airway & Oxygen Therapy: Patient Spontanous Breathing and Patient connected to face mask oxygen  Post-op Assessment: Report given to RN and Post -op Vital signs reviewed and stable  Post vital signs: Reviewed and stable  Last Vitals:  Vitals Value Taken Time  BP 119/70 08/28/20 0845  Temp 36.2 C 08/28/20 0845  Pulse 72 08/28/20 0846  Resp 11 08/28/20 0846  SpO2 100 % 08/28/20 0846  Vitals shown include unvalidated device data.  Last Pain:  Vitals:   08/28/20 0845  TempSrc:   PainSc: 0-No pain         Complications: No notable events documented.

## 2020-08-29 NOTE — Anesthesia Postprocedure Evaluation (Signed)
Anesthesia Post Note  Patient: Dean Obrien  Procedure(s) Performed: EXCISION MASS (Right: Buttocks)  Patient location during evaluation: PACU Anesthesia Type: General Level of consciousness: awake and alert Pain management: pain level controlled Vital Signs Assessment: post-procedure vital signs reviewed and stable Respiratory status: spontaneous breathing, nonlabored ventilation, respiratory function stable and patient connected to nasal cannula oxygen Cardiovascular status: blood pressure returned to baseline and stable Postop Assessment: no apparent nausea or vomiting Anesthetic complications: no   No notable events documented.   Last Vitals:  Vitals:   08/28/20 0900 08/28/20 0908  BP: 122/77 125/68  Pulse: 79 70  Resp: 20 18  Temp: (!) 36.2 C (!) 36.1 C  SpO2: 96% 96%    Last Pain:  Vitals:   08/28/20 0908  TempSrc: Temporal  PainSc: 0-No pain                 Arita Miss

## 2020-08-30 LAB — SURGICAL PATHOLOGY

## 2020-09-26 ENCOUNTER — Other Ambulatory Visit: Payer: Self-pay | Admitting: Internal Medicine

## 2020-10-16 ENCOUNTER — Other Ambulatory Visit: Payer: Self-pay | Admitting: Internal Medicine

## 2020-10-20 ENCOUNTER — Other Ambulatory Visit: Payer: Self-pay | Admitting: Family

## 2020-10-20 DIAGNOSIS — I25118 Atherosclerotic heart disease of native coronary artery with other forms of angina pectoris: Secondary | ICD-10-CM

## 2020-10-20 NOTE — Telephone Encounter (Signed)
Rx(s) sent to pharmacy electronically.  

## 2020-12-13 ENCOUNTER — Other Ambulatory Visit: Payer: Self-pay | Admitting: Internal Medicine

## 2021-01-18 ENCOUNTER — Other Ambulatory Visit: Payer: Self-pay | Admitting: Cardiovascular Disease

## 2021-01-18 DIAGNOSIS — I25118 Atherosclerotic heart disease of native coronary artery with other forms of angina pectoris: Secondary | ICD-10-CM

## 2021-03-02 ENCOUNTER — Encounter: Payer: Self-pay | Admitting: Internal Medicine

## 2021-03-05 MED ORDER — GABAPENTIN 100 MG PO CAPS: ORAL_CAPSULE | ORAL | 0 refills | Status: DC

## 2021-04-10 ENCOUNTER — Telehealth: Payer: Self-pay | Admitting: Cardiovascular Disease

## 2021-04-10 DIAGNOSIS — E785 Hyperlipidemia, unspecified: Secondary | ICD-10-CM

## 2021-04-10 MED ORDER — ATORVASTATIN CALCIUM 40 MG PO TABS
40.0000 mg | ORAL_TABLET | Freq: Every day | ORAL | 0 refills | Status: DC
Start: 2021-04-10 — End: 2021-04-24

## 2021-04-10 NOTE — Telephone Encounter (Signed)
Requested Prescriptions  ? ?Signed Prescriptions Disp Refills  ? atorvastatin (LIPITOR) 40 MG tablet 90 tablet 0  ?  Sig: Take 1 tablet (40 mg total) by mouth daily.  ?  Authorizing Provider: Minna Merritts  ?  Ordering User: Raelene Bott, Sophie Quiles L  ? ?

## 2021-04-10 NOTE — Telephone Encounter (Signed)
?*  STAT* If patient is at the pharmacy, call can be transferred to refill team. ? ? ?1. Which medications need to be refilled? (please list name of each medication and dose if known) atorvastatin 40 MG 1 tablet daily  ? ?2. Which pharmacy/location (including street and city if local pharmacy) is medication to be sent to? [facility] in [place]  ? ?3. Do they need a 30 day or 90 day supply? 90 day  ? ?

## 2021-04-23 NOTE — Progress Notes (Signed)
Cardiology Office Note ? ?Date:  04/24/2021  ? ?ID:  Dean Obrien, DOB [birth year], MRN [id number] ? ?PCP:  Crecencio Mc, MD  ? ?Chief Complaint  ?Patient presents with  ? 12 month follow up   ?  "Doing well." Medications reviewed by the patient verbally.   ? ? ?HPI:  ?61 year old gentleman PMH of  ?Smoking, quit 08/2019 ?CAD ?Severe aortic valve stenosis, ?Post op afib ?status post CABG x2 and aortic valve replacement on 09/14/2019.  ? ?On today's visit reports he is doing well ? ?Stress at home, wife with MS many years, paralyzed ?Uses a hoyer lift for her mobility otherwise essentially bedbound, no use of her arms ?Daughters available for assistance ? ?Prior imaging reviewed ?Echocardiogram : October 2021 ?Aortic valve functioning well, appropriate gradient ?Left ventricle function grossly normal ?Left ventricular diastolic parameters are consistent with Grade II diastolic  ?dysfunction (pseudonormalization).  ?25 mm Edwards Inspiris-Resilia valve present in the aortic position.  ?Procedure Date: 09/14/19.  ? ?Active at home, no restrictions, no chest pain shortness of breath no leg edema no PND orthopnea ?Feels he is doing well ? ?History of chronic Back pain ? ?EKG personally reviewed by myself on todays visit ?NSR rate 57 bpm no significant ST-T wave changes ? ?Other past medical history reviewed ?unstable angina sx with syncope  in 08/2019, leading to hospitalization, ? ? Echo 09/09/2019 with critical aortic stenosis with valve area by VTI 0.42 cm?, AV mean gradient 84.7 mmHg, aortic valve V-max 5.15 m/s, LVEF 55 to 60%, no RWMA, gr1DD, mild MR.  ? ?R/LHC 09/10/2019 significant one-vessel CAD with P RCA 90% stenosed, P LCx 20% stenosis, moderate M LAD 50% stenosis. ? ?CTA with probable bicuspid aortic valve.  Ascending aorta mildly enlarged at 3.7 cm tapering to 3.4 cm in arch.  He had a 7 mm nodule in LUL that will require CT follow-up in 6 months due to his tobacco use. ? ?08/25/2019 he underwent CABGx2 (LIM-LAD,  RIMA-RCA) and AVR with 25 mm Edwards Inspiris bioprosthetic.  He went into atrial fibrillation in the postoperative period and was treated with amiodarone drip. ? ? ?PMH:   has a past medical history of Aortic atherosclerosis ([place]), Aortic stenosis, severe (09/09/2018), CAD (coronary artery disease), Emphysema, unspecified ([place]), Grade II diastolic dysfunction, CABG (09/14/2019), Hyperlipidemia LDL goal <70, Hypertension, Lumbar spinal stenosis, Lumbar spondylosis, Multiple pulmonary nodules determined by computed tomography of lung, NSTEMI (non-ST elevated myocardial infarction) ([place]) (09/09/2019), Postoperative atrial fibrillation ([place]) (09/14/2019), and Prediabetes. ? ?PSH:    ?Past Surgical History:  ?Procedure Laterality Date  ? AORTIC VALVE REPLACEMENT N/A 09/14/2019  ? Procedure: AORTIC VALVE REPLACEMENT (AVR) USING INSPIRIS VALVE SIZE 25MM;  Surgeon: Wonda Olds, MD;  Location: [facility];  Service: Open Heart Surgery;  Laterality: N/A;  ? CORONARY ARTERY BYPASS GRAFT N/A 09/14/2019  ? Procedure: CORONARY ARTERY BYPASS GRAFTING (CABG); LIMA-LAD and RIMA-dRCA;  Surgeon: Wonda Olds, MD;  Location: [facility];  Service: Open Heart Surgery;  Laterality: N/A;  ? MASS EXCISION Right 08/28/2020  ? Procedure: EXCISION MASS;  Surgeon: Herbert Pun, MD;  Location: [facility];  Service: General;  Laterality: Right;  ? RIGHT/LEFT HEART CATH AND CORONARY ANGIOGRAPHY N/A 09/10/2019  ? Procedure: RIGHT/LEFT HEART CATH AND CORONARY ANGIOGRAPHY;  Surgeon: Wellington Hampshire, MD;  Location: [facility];  Service: Cardiovascular;  Laterality: N/A;  ? TEE WITHOUT CARDIOVERSION N/A 09/14/2019  ? Procedure: TRANSESOPHAGEAL ECHOCARDIOGRAM (TEE);  Surgeon: Wonda Olds, MD;  Location: [facility];  Service: Open Heart Surgery;  Laterality: N/A;  ? ? ?Current Outpatient Medications  ?Medication Sig Dispense Refill  ? acetaminophen (TYLENOL) 500 MG tablet Take 500-1,000 mg by mouth every 6 (six) hours as needed (pain).     ? aspirin EC 81 MG EC tablet Take 1 tablet (81 mg total) by mouth daily. Swallow whole. (Patient taking differently: Take 81 mg by mouth in the morning. Swallow whole.) 30 tablet 11  ? atorvastatin (LIPITOR) 40 MG tablet Take 1 tablet (40 mg total) by mouth daily. 90 tablet 0  ? clopidogrel (PLAVIX) 75 MG tablet TAKE 1 TABLET(75 MG) BY MOUTH DAILY 90 tablet 3  ? fluticasone (FLONASE) 50 MCG/ACT nasal spray SHAKE LIQUID AND USE 2 SPRAYS IN EACH NOSTRIL DAILY 48 g 3  ? gabapentin (NEURONTIN) 100 MG capsule TAKE TWO CAPSULES BY MOUTH FOUR TIMES A DAY 240 capsule 0  ? HYDROcodone-acetaminophen (NORCO) 10-325 MG tablet Take 1 tablet by mouth every 6 (six) hours as needed. 30 tablet 0  ? loratadine (CLARITIN) 10 MG tablet Take 10 mg by mouth in the morning.    ? metoprolol tartrate (LOPRESSOR) 25 MG tablet TAKE 1 TABLET(25 MG) BY MOUTH TWICE DAILY 180 tablet 1  ? naphazoline-pheniramine (ALLERGY EYE) 0.025-0.3 % ophthalmic solution Place 1-2 drops into both eyes in the morning.    ? telmisartan (MICARDIS) 20 MG tablet TAKE 1 TABLET(20 MG) BY MOUTH AT BEDTIME 90 tablet 3  ? traZODone (DESYREL) 50 MG tablet TAKE 1/2 TO 1 TABLET(25 TO 50 MG) BY MOUTH AT BEDTIME AS NEEDED FOR SLEEP 90 tablet 3  ? ?No current facility-administered medications for this visit.  ? ? ? ?Allergies:   Tramadol and Benadryl [diphenhydramine]  ? ?Social History:  The patient  reports that he quit smoking about 19 months ago. His smoking use included cigarettes. He has a 60.00 pack-year smoking history. He has never used smokeless tobacco. He reports that he does not drink alcohol and does not use drugs.  ? ?Family History:   family history includes Emphysema in his mother; Hypertension in his brother and sister; Liver disease in his mother; Lung cancer in his father.  ? ? ?Review of Systems: ?Review of Systems  ?Constitutional: Negative.   ?HENT: Negative.    ?Respiratory: Negative.    ?Cardiovascular: Negative.   ?Gastrointestinal: Negative.    ?Musculoskeletal: Negative.   ?Neurological: Negative.   ?Psychiatric/Behavioral: Negative.    ?All other systems reviewed and are negative. ? ?PHYSICAL EXAM: ?VS:  BP 126/80 (BP Location: Left Arm, Patient Position: Sitting, Cuff Size: Normal)   Pulse (!) 57   Ht '5\' 3"'$  (1.6 m)   Wt 72.3 kg   SpO2 98%   BMI 28.25 kg/m?  , BMI Body mass index is 28.25 kg/m?Marland Kitchen ?Constitutional:  oriented to person, place, and time. No distress.  ?HENT:  ?Head: Grossly normal ?Eyes:  no discharge. No scleral icterus.  ?Neck: No JVD, no carotid bruits  ?Cardiovascular: Regular rate and rhythm, 1/6 systolic ejection murmur appreciated right sternal border  ?Pulmonary/Chest: Clear to auscultation bilaterally, no wheezes or rails ?Abdominal: Soft.  no distension.  no tenderness.  ?Musculoskeletal: Normal range of motion ?Neurological:  normal muscle tone. Coordination normal. No atrophy ?Skin: Skin warm and dry ?Psychiatric: normal affect, pleasant ? ? ?Recent Labs: ?07/05/2020: ALT 23; BUN 18; Creatinine, Ser 0.71; Potassium 4.1; Sodium 138  ? ? ?Lipid Panel ?Lab Results  ?Component Value Date  ? CHOL 90 (L) 11/09/2019  ? HDL 47 11/09/2019  ?  [place] 31 11/09/2019  ? TRIG 46 11/09/2019  ? ? ?Wt Readings from Last 3 Encounters:  ?04/24/21 72.3 kg  ?08/22/20 70 kg  ?07/05/20 70 kg  ?  ? ?ASSESSMENT AND PLAN: ? ?Problem List Items Addressed This Visit   ? ? HYPERTENSION, BENIGN  ? Hyperlipidemia LDL goal <70  ? Coronary artery disease - Primary  ? ?Other Visit Diagnoses   ? ? Severe aortic stenosis      ? Hx of CABG      ? S/P AVR      ? Essential hypertension      ? Tobacco use      ? Postoperative atrial fibrillation ([facility])      ? ?  ?CAD with stable angina ?Grafting x2,  ?Currently with no symptoms of angina. No further workup at this time. Continue current medication regimen. ?stopped smoking,  ?Repeat lipid panel ordered ? ?AVR ?Bioprosthetic valve ?On asa  ?We can discontinue the Plavix ?Repeat echo end of  2023 ? ?Hyperlipidemia ?Lipid panel ordered ?Previously at goal on Lipitor ? ? ? Total encounter time more than 30 minutes ? Greater than 50% was spent in counseling and coordination of care with the patient ? ? ? ?Signed, ?Esmond Plants,

## 2021-04-24 ENCOUNTER — Other Ambulatory Visit: Payer: Self-pay

## 2021-04-24 ENCOUNTER — Ambulatory Visit: Admitting: Cardiovascular Disease

## 2021-04-24 ENCOUNTER — Encounter: Payer: Self-pay | Admitting: Cardiovascular Disease

## 2021-04-24 VITALS — BP 126/80 | HR 57 | Ht 63.0 in | Wt 159.5 lb

## 2021-04-24 DIAGNOSIS — Z720 Tobacco use: Secondary | ICD-10-CM

## 2021-04-24 DIAGNOSIS — I9789 Other postprocedural complications and disorders of the circulatory system, not elsewhere classified: Secondary | ICD-10-CM

## 2021-04-24 DIAGNOSIS — I350 Nonrheumatic aortic (valve) stenosis: Secondary | ICD-10-CM | POA: Diagnosis not present

## 2021-04-24 DIAGNOSIS — Z952 Presence of prosthetic heart valve: Secondary | ICD-10-CM

## 2021-04-24 DIAGNOSIS — I10 Essential (primary) hypertension: Secondary | ICD-10-CM

## 2021-04-24 DIAGNOSIS — Z951 Presence of aortocoronary bypass graft: Secondary | ICD-10-CM

## 2021-04-24 DIAGNOSIS — I25118 Atherosclerotic heart disease of native coronary artery with other forms of angina pectoris: Secondary | ICD-10-CM | POA: Diagnosis not present

## 2021-04-24 DIAGNOSIS — E785 Hyperlipidemia, unspecified: Secondary | ICD-10-CM

## 2021-04-24 DIAGNOSIS — I4891 Unspecified atrial fibrillation: Secondary | ICD-10-CM

## 2021-04-24 MED ORDER — GABAPENTIN 100 MG PO CAPS: ORAL_CAPSULE | ORAL | 0 refills | Status: DC

## 2021-04-24 MED ORDER — TELMISARTAN 20 MG PO TABS: ORAL_TABLET | ORAL | 3 refills | Status: DC

## 2021-04-24 MED ORDER — METOPROLOL TARTRATE 25 MG PO TABS: ORAL_TABLET | ORAL | 3 refills | Status: DC

## 2021-04-24 MED ORDER — ATORVASTATIN CALCIUM 40 MG PO TABS: 40.0000 mg | ORAL_TABLET | Freq: Every day | ORAL | 3 refills | Status: DC

## 2021-04-24 NOTE — Patient Instructions (Addendum)
Medication Instructions:  ?Ok to stop plavix/clopidogrel ? ?If you need a refill on your cardiac medications before your next appointment, please call your pharmacy.  ? ?Lab work: ?Lipids, LFTs ? ?Testing/Procedures: ?Your physician has requested that you have an echocardiogram in December 2023.  ? ?Echocardiography is a painless test that uses sound waves to create images of your heart. It provides your doctor with information about the size and shape of your heart and how well your heart?s chambers and valves are working. This procedure takes approximately one hour. There are no restrictions for this procedure. ? ? ?Follow-Up: ?At [facility], you and your health needs are our priority.  As part of our continuing mission to provide you with exceptional heart care, we have created designated Provider Care Teams.  These Care Teams include your primary Cardiologist (physician) and Advanced Practice Providers (APPs -  Physician Assistants and Nurse Practitioners) who all work together to provide you with the care you need, when you need it. ? ?You will need a follow up appointment in 12 months ? ?Providers on your designated Care Team:   ?Murray Hodgkins, NP ?Christell [facility], PA-C ?Cadence Kathlen Mody, PA-C ? ?COVID-19 Vaccine Information can be found at: [email or web address].co.[email or web address] For questions related to vaccine distribution or appointments, please email [email or web address] or call [phone].  ? ?

## 2021-04-25 LAB — HEPATIC FUNCTION PANEL
ALT: 25 IU/L (ref 0–44)
AST: 19 IU/L (ref 0–40)
Albumin: 4.4 g/dL (ref 3.8–4.9)
Alkaline Phosphatase: 80 IU/L (ref 44–121)
Bilirubin Total: 0.5 mg/dL (ref 0.0–1.2)
Bilirubin, Direct: 0.18 mg/dL (ref 0.00–0.40)
Total Protein: 7.3 g/dL (ref 6.0–8.5)

## 2021-04-25 LAB — LIPID PANEL
Chol/HDL Ratio: 1.9 ratio (ref 0.0–5.0)
Cholesterol, Total: 96 mg/dL — ABNORMAL LOW (ref 100–199)
HDL: 50 mg/dL (ref >39)
LDL Chol Calc (NIH): 33 mg/dL (ref 0–99)
Triglycerides: 52 mg/dL (ref 0–149)
VLDL Cholesterol Cal: 13 mg/dL (ref 5–40)

## 2021-05-08 ENCOUNTER — Other Ambulatory Visit: Payer: Self-pay

## 2021-05-22 ENCOUNTER — Other Ambulatory Visit: Payer: Self-pay | Admitting: Internal Medicine

## 2021-05-29 IMAGING — DX DG CHEST 1V PORT
1 series · 1 of 1 positions shown · non-contrast
Comparison: September 14, 2019.

CLINICAL DATA: Status post cardiac surgery.

EXAM:
PORTABLE CHEST 1 VIEW

[chest]
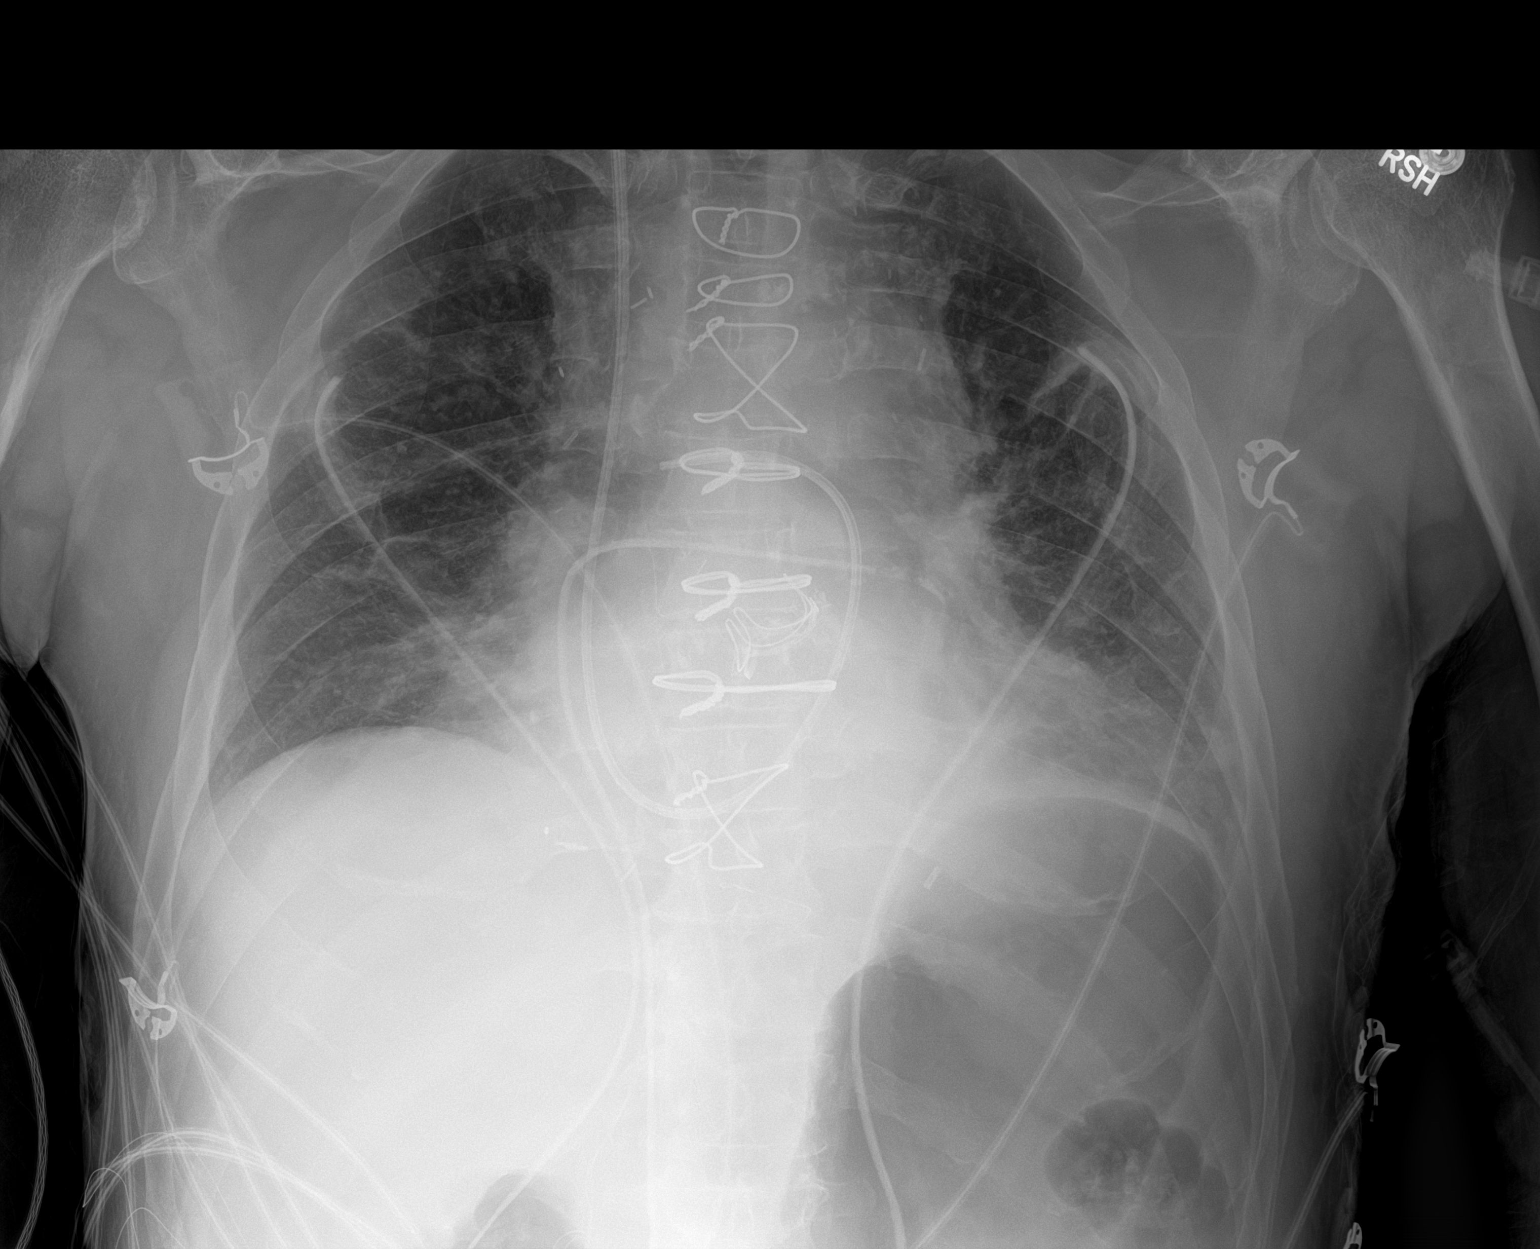

[1 of 1 positions shown; findings below may reference images not displayed]

FINDINGS: Stable cardiomediastinal silhouette. Status post aortic valve
repair. Endotracheal tube has been removed. Stable position of right
internal jugular Swan-Ganz catheter with tip directed toward right
pulmonary artery. Stable bilateral chest tubes are noted. No
pneumothorax is noted. Mild bibasilar subsegmental atelectasis is
noted. Bony thorax is unremarkable.
IMPRESSION: Stable bilateral chest tubes are noted without pneumothorax. Mild
bibasilar subsegmental atelectasis. Endotracheal tube has been
removed.

## 2021-06-01 IMAGING — DX DG CHEST 1V
1 series · 1 of 1 positions shown · non-contrast
Comparison: September 17, 2019

CLINICAL DATA: History of open heart surgery

EXAM:
CHEST  1 VIEW

[chest ap]
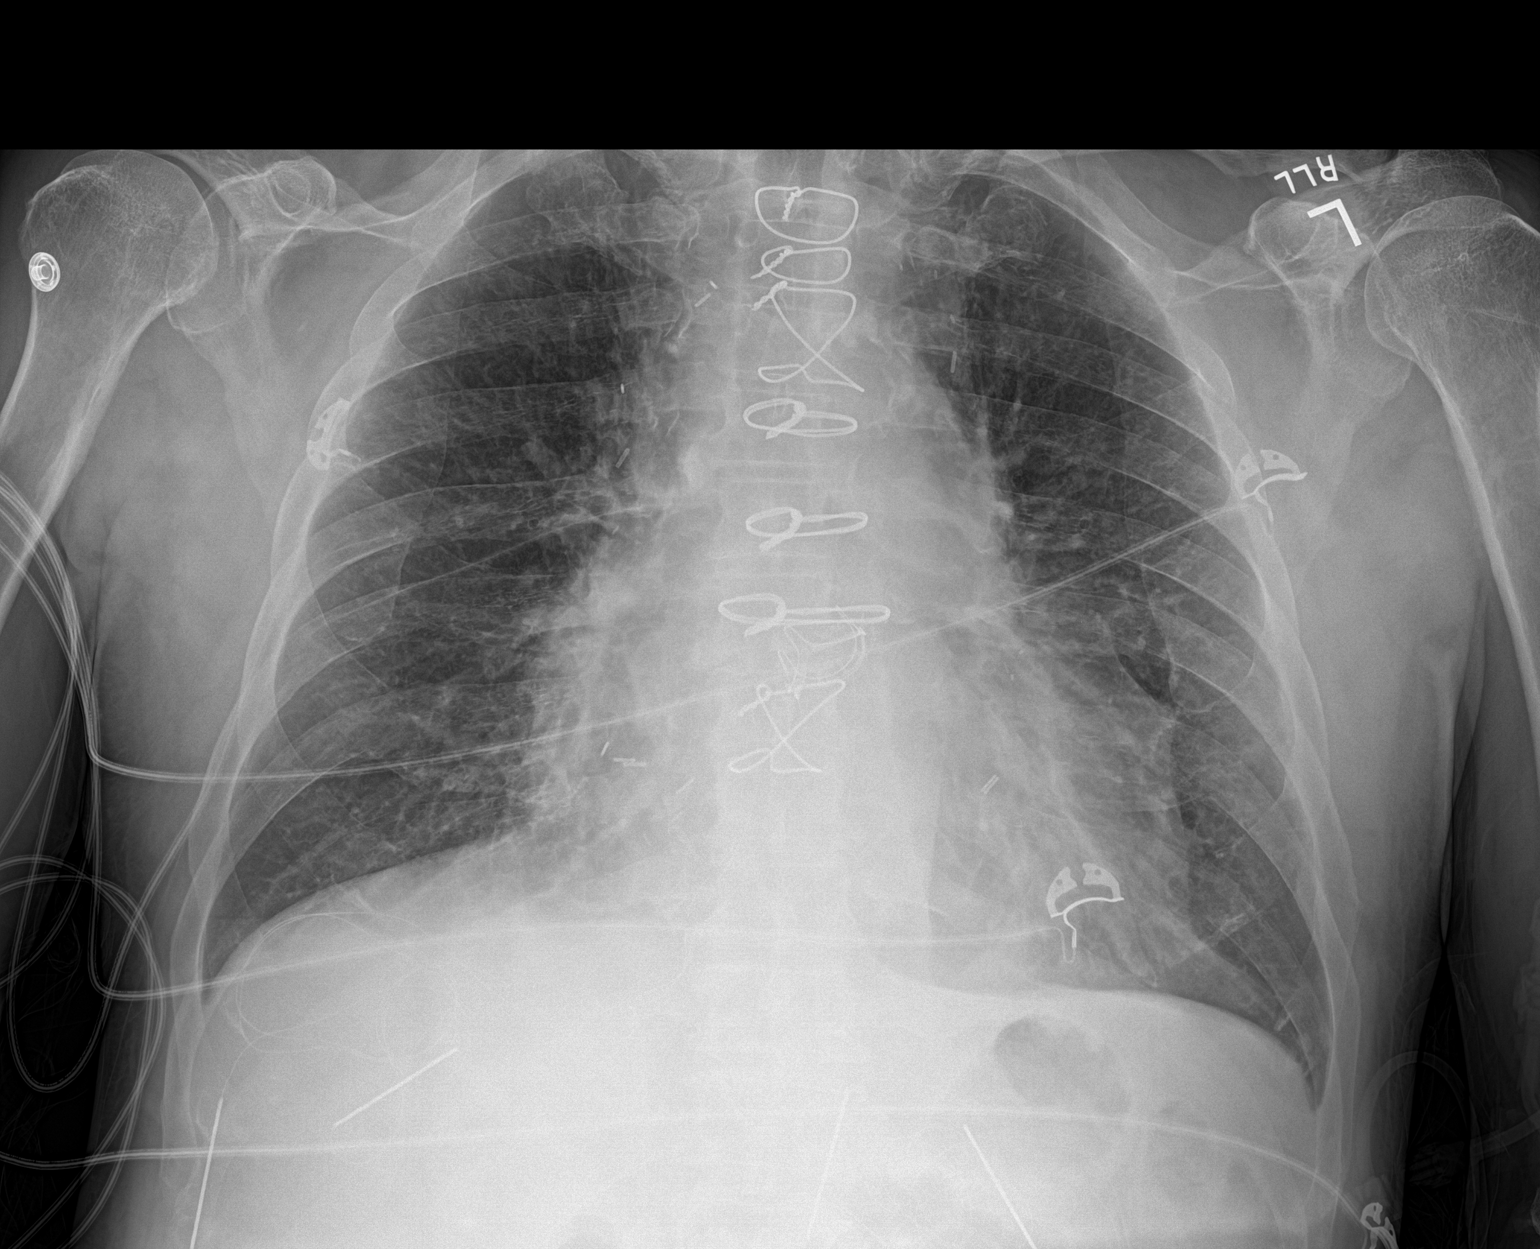

[1 of 1 positions shown; findings below may reference images not displayed]

FINDINGS: Stable cardiomegaly. Support apparatus is been removed. No
pneumothorax. The hila and mediastinum are stable. No overt edema.
No nodules, masses, or focal infiltrates.
IMPRESSION: Interval removal of support apparatus. No overt edema. No
significant interval change otherwise seen.

## 2021-06-17 ENCOUNTER — Other Ambulatory Visit: Payer: Self-pay | Admitting: Internal Medicine

## 2021-07-30 IMAGING — DX DG CHEST 2V
2 series · 2 of 2 positions shown · non-contrast
Comparison: PA and lateral chest 10/04/2019.

CLINICAL DATA: Patient status post CABG and aortic valve
replacement 09/14/2019. Possible protruding wire.

EXAM:
CHEST - 2 VIEW

[dg chest 2 view (1 of 2)]
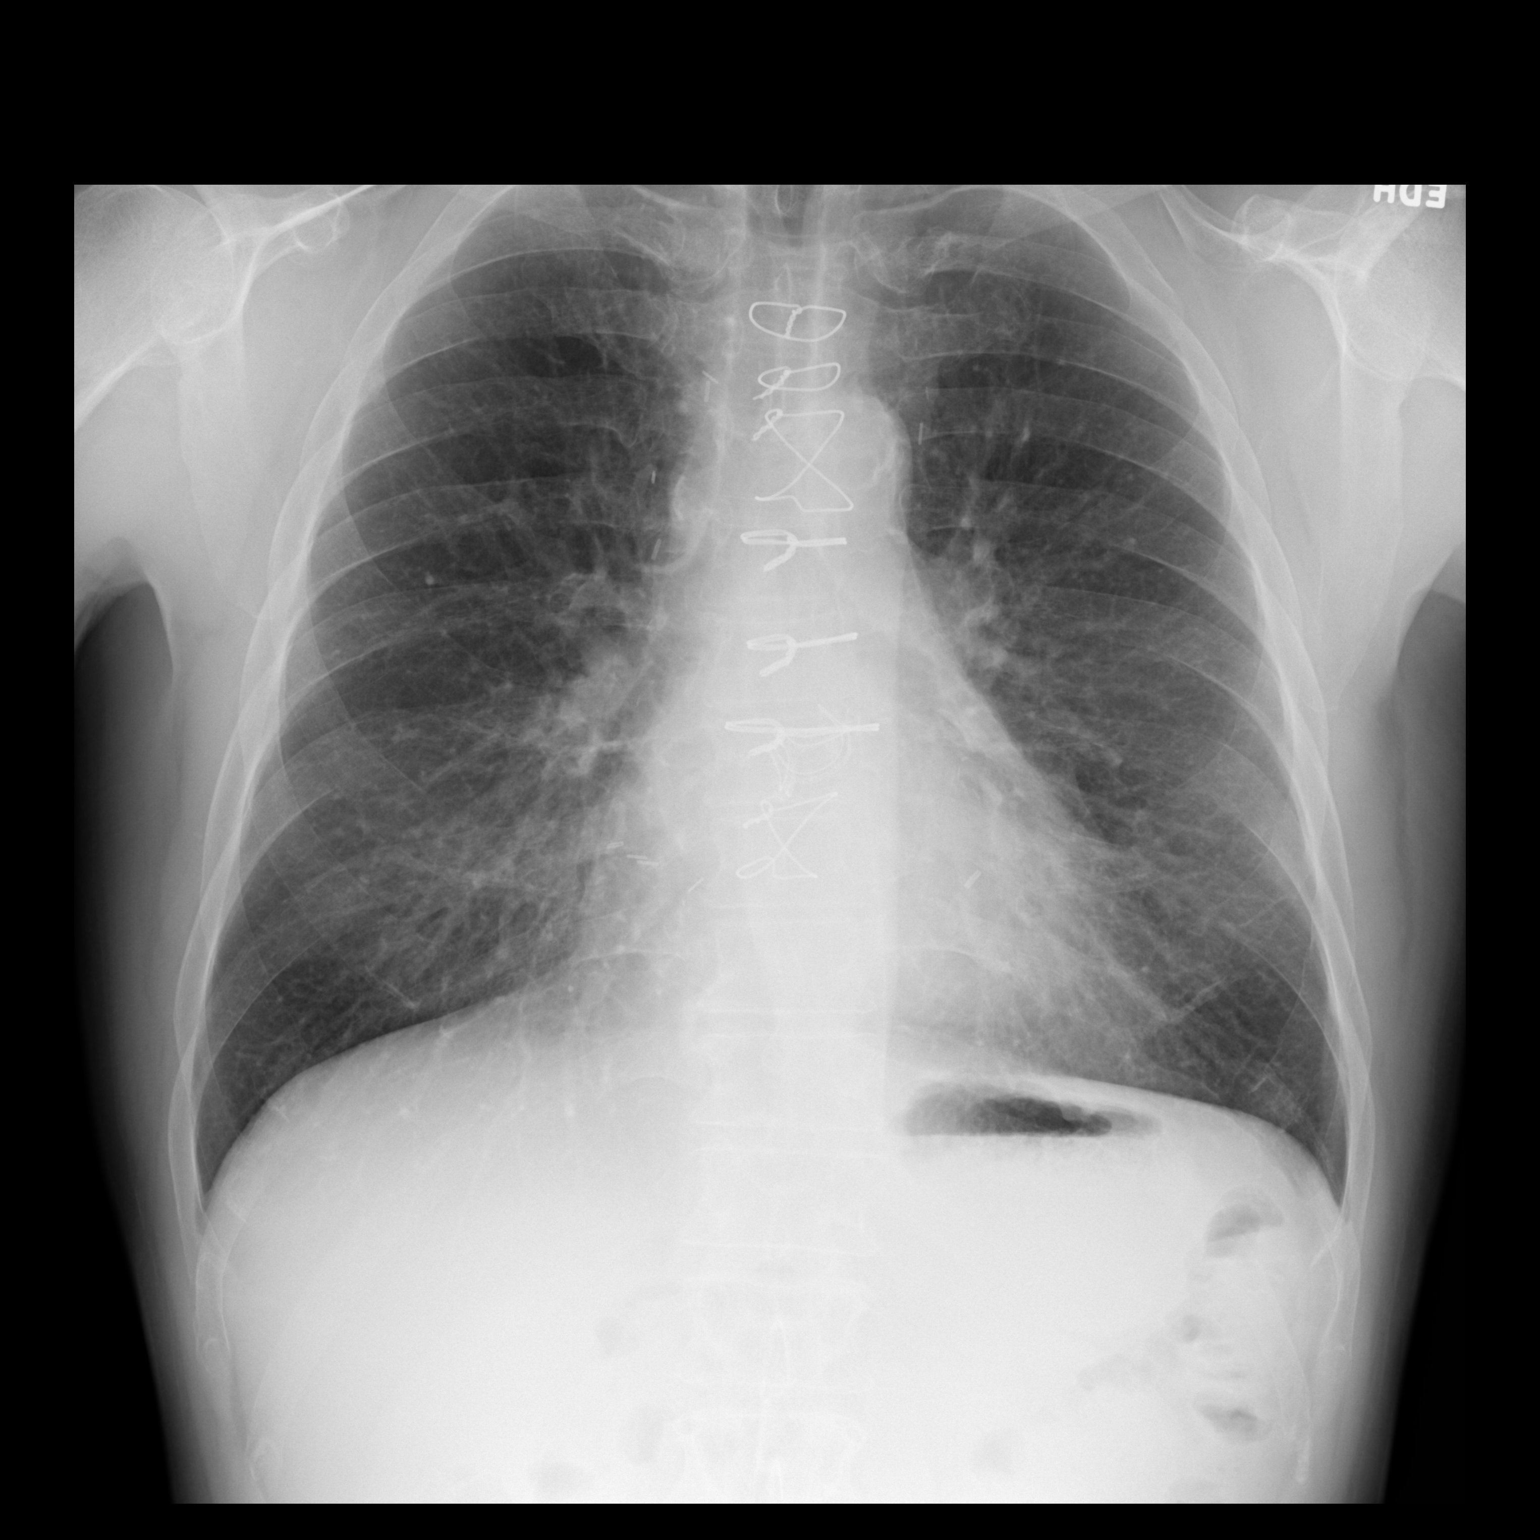

[dg chest 2 view (2 of 2)]
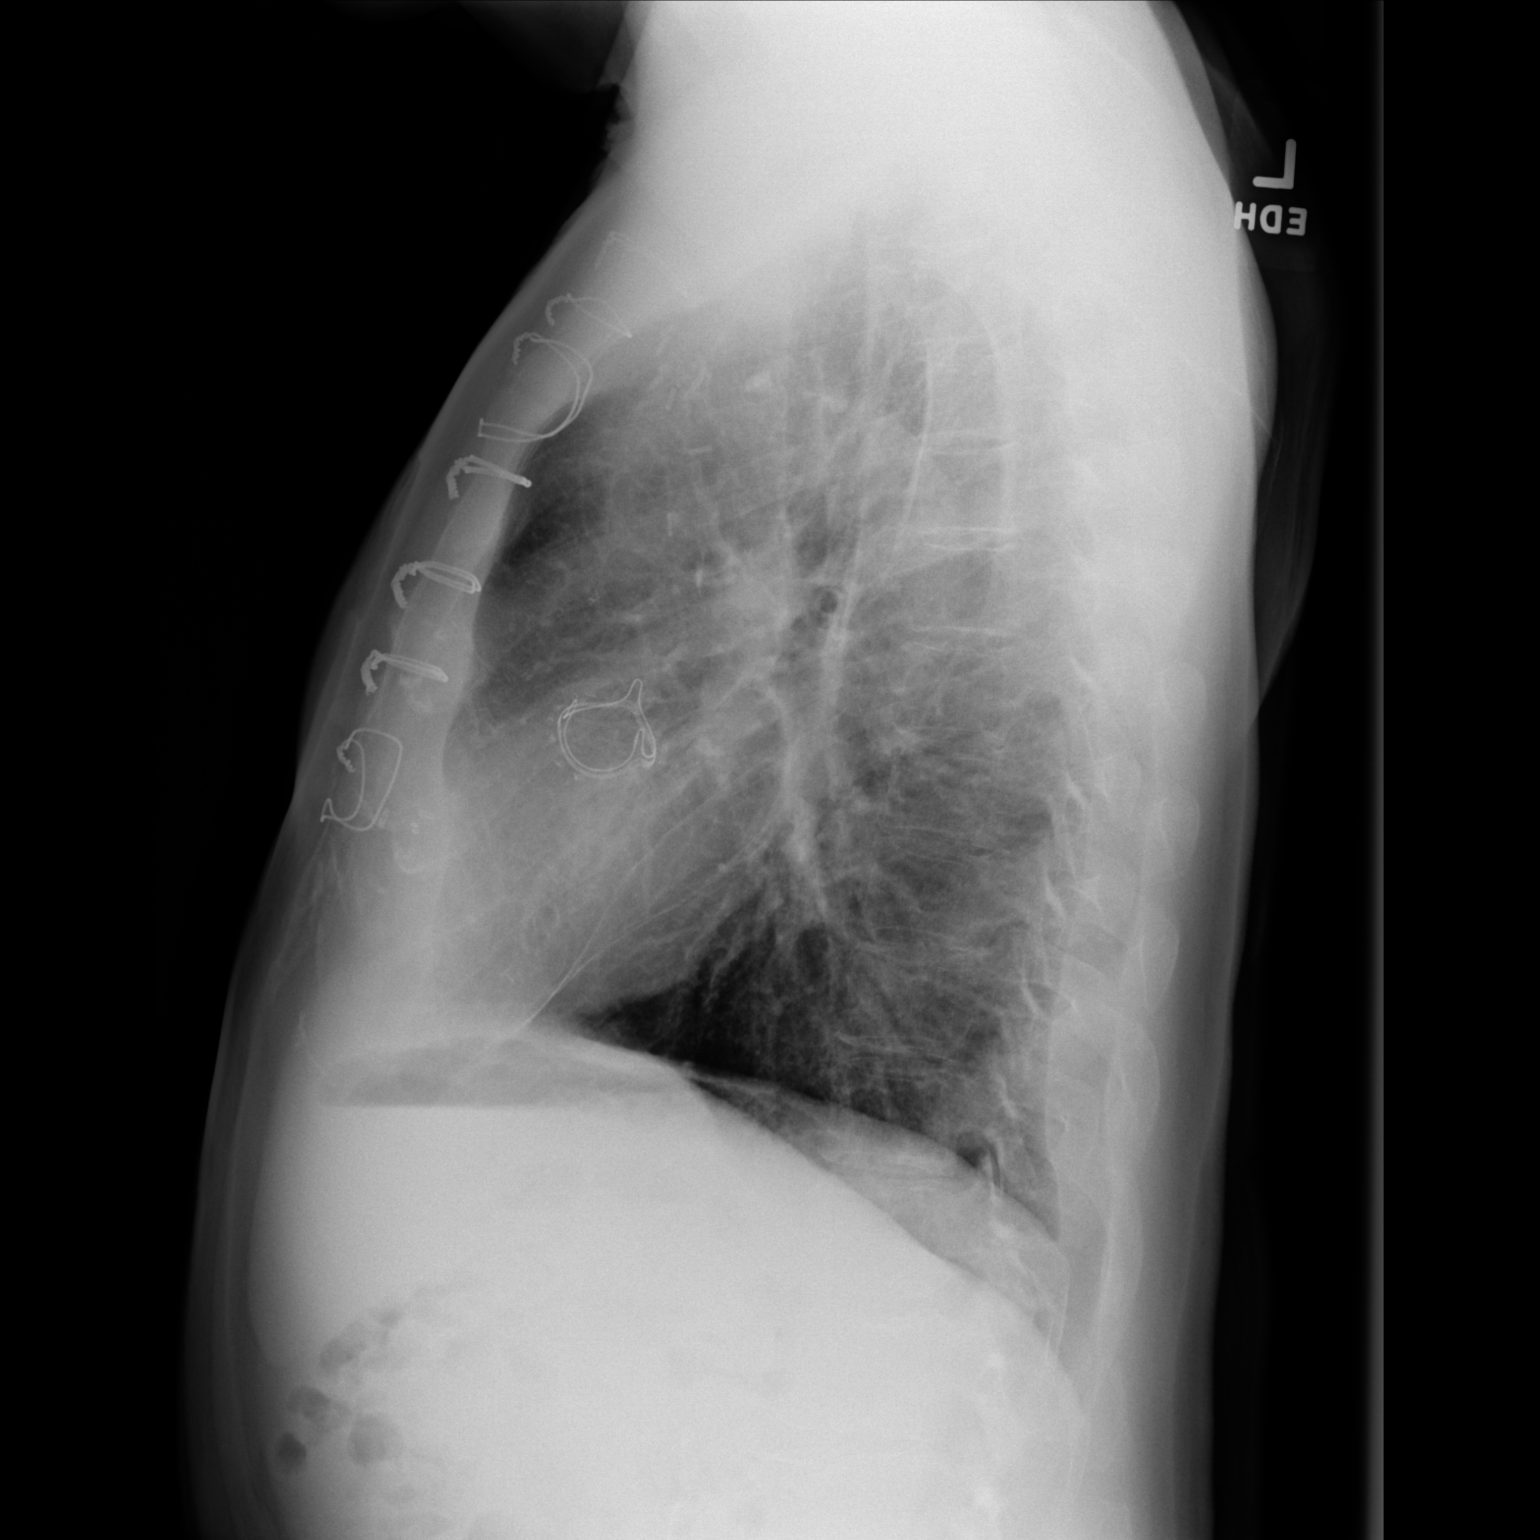

[2 of 2 positions shown; findings below may reference images not displayed]

FINDINGS: Seven median sternotomy wires are intact and unchanged in position.
Prosthetic aortic valve is noted. Heart size is normal. Lungs are
clear. No pneumothorax or pleural effusion. No acute or focal bony
abnormality.
IMPRESSION: No change in the position of the patient's median sternotomy wires
or aortic valve. No acute disease.

## 2021-09-25 ENCOUNTER — Other Ambulatory Visit: Payer: Self-pay | Admitting: Internal Medicine

## 2021-10-17 ENCOUNTER — Other Ambulatory Visit: Payer: Self-pay | Admitting: Internal Medicine

## 2021-10-17 MED ORDER — GABAPENTIN 100 MG PO CAPS: ORAL_CAPSULE | ORAL | 0 refills | Status: DC

## 2021-11-14 ENCOUNTER — Other Ambulatory Visit: Payer: Self-pay | Admitting: Internal Medicine

## 2021-11-14 NOTE — Telephone Encounter (Signed)
LMTCB. Pt has not been seen in over a year and will need to schedule a follow up appt.

## 2021-12-25 ENCOUNTER — Other Ambulatory Visit: Payer: Self-pay | Admitting: Internal Medicine

## 2021-12-27 ENCOUNTER — Ambulatory Visit: Attending: Cardiovascular Disease

## 2021-12-27 DIAGNOSIS — Z952 Presence of prosthetic heart valve: Secondary | ICD-10-CM | POA: Diagnosis not present

## 2021-12-27 DIAGNOSIS — I350 Nonrheumatic aortic (valve) stenosis: Secondary | ICD-10-CM | POA: Diagnosis not present

## 2021-12-27 LAB — ECHOCARDIOGRAM COMPLETE
AR max vel: 1.33 cm2
AV Area VTI: 1.27 cm2
AV Area mean vel: 1.37 cm2
AV Mean grad: 10 mmHg
AV Peak grad: 18.6 mmHg
AV Vena cont: 0.2 cm
Ao pk vel: 2.16 m/s
Area-P 1/2: 5.02 cm2
Calc EF: 54 %
S' Lateral: 3.3 cm
Single Plane A2C EF: 53.8 %
Single Plane A4C EF: 54.6 %

## 2022-01-10 ENCOUNTER — Encounter: Payer: Self-pay | Admitting: Internal Medicine

## 2022-01-10 ENCOUNTER — Ambulatory Visit: Admitting: Internal Medicine

## 2022-01-10 ENCOUNTER — Other Ambulatory Visit: Payer: Self-pay

## 2022-01-10 VITALS — BP 158/80 | HR 62 | Temp 97.6°F | Ht 63.0 in | Wt 160.8 lb

## 2022-01-10 DIAGNOSIS — Z23 Encounter for immunization: Secondary | ICD-10-CM | POA: Diagnosis not present

## 2022-01-10 DIAGNOSIS — Z87891 Personal history of nicotine dependence: Secondary | ICD-10-CM

## 2022-01-10 DIAGNOSIS — Z0000 Encounter for general adult medical examination without abnormal findings: Secondary | ICD-10-CM

## 2022-01-10 DIAGNOSIS — R918 Other nonspecific abnormal finding of lung field: Secondary | ICD-10-CM | POA: Diagnosis not present

## 2022-01-10 DIAGNOSIS — Z122 Encounter for screening for malignant neoplasm of respiratory organs: Secondary | ICD-10-CM

## 2022-01-10 DIAGNOSIS — Z125 Encounter for screening for malignant neoplasm of prostate: Secondary | ICD-10-CM | POA: Diagnosis not present

## 2022-01-10 DIAGNOSIS — E785 Hyperlipidemia, unspecified: Secondary | ICD-10-CM | POA: Diagnosis not present

## 2022-01-10 DIAGNOSIS — I2510 Atherosclerotic heart disease of native coronary artery without angina pectoris: Secondary | ICD-10-CM

## 2022-01-10 DIAGNOSIS — R7303 Prediabetes: Secondary | ICD-10-CM | POA: Diagnosis not present

## 2022-01-10 DIAGNOSIS — D62 Acute posthemorrhagic anemia: Secondary | ICD-10-CM

## 2022-01-10 DIAGNOSIS — I10 Essential (primary) hypertension: Secondary | ICD-10-CM | POA: Diagnosis not present

## 2022-01-10 DIAGNOSIS — Z953 Presence of xenogenic heart valve: Secondary | ICD-10-CM

## 2022-01-10 DIAGNOSIS — I2585 Chronic coronary microvascular dysfunction: Secondary | ICD-10-CM

## 2022-01-10 LAB — CBC WITH DIFFERENTIAL/PLATELET
Basophils Absolute: 0.1 10*3/uL (ref 0.0–0.1)
Basophils Relative: 0.7 % (ref 0.0–3.0)
Eosinophils Absolute: 0.2 10*3/uL (ref 0.0–0.7)
Eosinophils Relative: 2 % (ref 0.0–5.0)
HCT: 45.5 % (ref 39.0–52.0)
Hemoglobin: 15.4 g/dL (ref 13.0–17.0)
Lymphocytes Relative: 22.1 % (ref 12.0–46.0)
Lymphs Abs: 2 10*3/uL (ref 0.7–4.0)
MCHC: 33.8 g/dL (ref 30.0–36.0)
MCV: 90.3 fl (ref 78.0–100.0)
Monocytes Absolute: 0.8 10*3/uL (ref 0.1–1.0)
Monocytes Relative: 8.7 % (ref 3.0–12.0)
Neutro Abs: 6.1 10*3/uL (ref 1.4–7.7)
Neutrophils Relative %: 66.5 % (ref 43.0–77.0)
Platelets: 203 10*3/uL (ref 150.0–400.0)
RBC: 5.04 Mil/uL (ref 4.22–5.81)
RDW: 13.6 % (ref 11.5–15.5)
WBC: 9.1 10*3/uL (ref 4.0–10.5)

## 2022-01-10 LAB — COMPREHENSIVE METABOLIC PANEL
ALT: 22 U/L (ref 0–53)
AST: 17 U/L (ref 0–37)
Albumin: 4.6 g/dL (ref 3.5–5.2)
Alkaline Phosphatase: 68 U/L (ref 39–117)
BUN: 15 mg/dL (ref 6–23)
CO2: 30 mEq/L (ref 19–32)
Calcium: 9.7 mg/dL (ref 8.4–10.5)
Chloride: 100 mEq/L (ref 96–112)
Creatinine, Ser: 0.64 mg/dL (ref 0.40–1.50)
GFR: 102.94 mL/min (ref >60.00)
Glucose, Bld: 97 mg/dL (ref 70–99)
Potassium: 4.2 mEq/L (ref 3.5–5.1)
Sodium: 139 mEq/L (ref 135–145)
Total Bilirubin: 0.8 mg/dL (ref 0.2–1.2)
Total Protein: 7.9 g/dL (ref 6.0–8.3)

## 2022-01-10 LAB — MICROALBUMIN / CREATININE URINE RATIO
Creatinine,U: 17.6 mg/dL
Microalb Creat Ratio: 4 mg/g (ref 0.0–30.0)
Microalb, Ur: <0.7 mg/dL (ref 0.0–1.9)

## 2022-01-10 LAB — PSA: PSA: 1.16 ng/mL (ref 0.10–4.00)

## 2022-01-10 LAB — HEMOGLOBIN A1C: Hgb A1c MFr Bld: 6.1 % (ref 4.6–6.5)

## 2022-01-10 LAB — TSH: TSH: 3.07 u[IU]/mL (ref 0.35–5.50)

## 2022-01-10 MED ORDER — GABAPENTIN 100 MG PO CAPS: ORAL_CAPSULE | ORAL | 0 refills | Status: DC

## 2022-01-10 MED ORDER — AMOXICILLIN 500 MG PO CAPS: 2000.0000 mg | ORAL_CAPSULE | Freq: Once | ORAL | 1 refills | Status: AC | PRN

## 2022-01-10 NOTE — Progress Notes (Addendum)
Patient ID: Dean Obrien, male    DOB: 06-Sep-1960  Age: 61 y.o. MRN: [id number]  The patient is here for annual preventive examination and management of other chronic and acute problems.  Last seen June 2022  [facility] cardiology April 2023 . ECHO repeated s/p AVR 2021 Last colonoscopy at age 4  No results  No recent  PSA  Pulmonary nodules . Has not had follow up   [place] RSV ON OCT 24 WTH FLU    The risk factors are reflected in the social history.   The roster of all physicians providing medical care to patient - is listed in the Snapshot section of the chart.   Activities of daily living:  The patient is 100% independent in all ADLs: dressing, toileting, feeding as well as independent mobility   Home safety : The patient has smoke detectors in the home. They wear seatbelts.  There are no unsecured firearms at home. There is no violence in the home.    There is no risks for hepatitis, STDs or HIV. There is no   history of blood transfusion. They have no travel history to infectious disease endemic areas of the world.   The patient has seen their dentist in the last six month. They have seen their eye doctor in the last year. The patinet  denies slight hearing difficulty with regard to whispered voices and some television programs.  They have deferred audiologic testing in the last year.  They do not  have excessive sun exposure. Discussed the need for sun protection: hats, long sleeves and use of sunscreen if there is significant sun exposure.    Diet: the importance of a healthy diet is discussed. They do have a healthy diet.   The benefits of regular aerobic exercise were discussed. The patient  averages 6 miles a day per [employer] at work or 15000 steps     Depression screen: there are no signs or vegative symptoms of depression- irritability, change in appetite, anhedonia, sadness/tearfullness.   The following portions of the patient's history were reviewed and  updated as appropriate: allergies, current medications, past family history, past medical history,  past surgical history, past social history  and problem list.   Visual acuity was not assessed per patient preference since the patient has regular follow up with an  ophthalmologist. Hearing and body mass index were assessed and reviewed.    During the course of the visit the patient was educated and counseled about appropriate screening and preventive services including : fall prevention , diabetes screening, nutrition counseling, colorectal cancer screening, and recommended immunizations.    Chief Complaint:  Still has back ache but no sciatica .  Foot pain better with with regular use of .  Gabapentin stands on CEMENT FLOOR DAILY .    Review of Symptoms  Patient denies headache, fevers, malaise, unintentional weight loss, skin rash, eye pain, sinus congestion and sinus pain, sore throat, dysphagia,  hemoptysis , cough, dyspnea, wheezing, chest pain, palpitations, orthopnea, edema, abdominal pain, nausea, melena, diarrhea, constipation, flank pain, dysuria, hematuria, urinary  Frequency, nocturia, numbness, tingling, seizures,  Focal weakness, Loss of consciousness,  Tremor, insomnia, depression, anxiety, and suicidal ideation.    Physical Exam:  BP (!) 158/80   Pulse 62   Temp 97.6 F (36.4 C) (Oral)   Ht '5\' 3"'$  (1.6 m)   Wt 160 lb 12.8 oz (72.9 kg)   SpO2 97%   BMI 28.48 kg/m    Physical Exam  Vitals reviewed.  Constitutional:      General: He is not in acute distress.    Appearance: Normal appearance. He is normal weight. He is not ill-appearing, toxic-appearing or diaphoretic.  HENT:     Head: Normocephalic and atraumatic.     Right Ear: Tympanic membrane, ear canal and external ear normal. There is no impacted cerumen.     Left Ear: Tympanic membrane, ear canal and external ear normal. There is no impacted cerumen.     Nose: Nose normal.     Mouth/Throat:     Mouth: Mucous  membranes are moist.     Pharynx: Oropharynx is clear.  Eyes:     General: No scleral icterus.       Right eye: No discharge.        Left eye: No discharge.     Conjunctiva/sclera: Conjunctivae normal.  Neck:     Thyroid: No thyromegaly.     Vascular: No carotid bruit or JVD.  Cardiovascular:     Rate and Rhythm: Normal rate and regular rhythm.     Heart sounds: Normal heart sounds.  Pulmonary:     Effort: Pulmonary effort is normal. No respiratory distress.     Breath sounds: Normal breath sounds.  Abdominal:     General: Bowel sounds are normal.     Palpations: Abdomen is soft. There is no mass.     Tenderness: There is no abdominal tenderness. There is no guarding or rebound.  Musculoskeletal:        General: Normal range of motion.     Cervical back: Normal range of motion and neck supple.  Lymphadenopathy:     Cervical: No cervical adenopathy.  Skin:    General: Skin is warm and dry.  Neurological:     General: No focal deficit present.     Mental Status: He is alert and oriented to person, place, and time. Mental status is at baseline.  Psychiatric:        Mood and Affect: Mood normal.        Behavior: Behavior normal.        Thought Content: Thought content normal.        Judgment: Judgment normal.     Assessment and Plan: Prostate cancer screening -     PSA  Hyperlipidemia LDL goal <70 -     TSH  Prediabetes Assessment & Plan: He has total revised his diet and has lost 12 lbs. However, his Repeat a1c is higher Lab Results  Component Value Date   HGBA1C 6.1 01/10/2022     Orders: -     Comprehensive metabolic panel -     Hemoglobin A1c  HYPERTENSION, BENIGN Assessment & Plan: Well controlled on current regimen of metoprolol and telmisartan . Renal function stable, no changes today.   Lab Results  Component Value Date   CREATININE 0.64 01/10/2022   Lab Results  Component Value Date   NA 139 01/10/2022   K 4.2 01/10/2022   CL 100 01/10/2022    CO2 30 01/10/2022     Orders: -     Microalbumin / creatinine urine ratio  Acute blood loss as cause of postoperative anemia -     CBC with Differential/Platelet  Pulmonary nodules/lesions, multiple -     Ambulatory referral to Pulmonology  Need for Tdap vaccination -     Tdap vaccine greater than or equal to 7yo IM  Multiple pulmonary nodules determined by computed tomography of lung Assessment & Plan:  Referring to Pulmonary Clinic to follow up on nodules last imaged in April 2022.    S/P aortic valve replacement with bioprosthetic valve Assessment & Plan: Annual ECHO reviewed.  Rx for prophylactic treatment prescribed    Chronic coronary microvascular dysfunction Assessment & Plan: Continue stati for LDL or less .Marland Kitchen  LFTs normal  .  No changes today  Lab Results  Component Value Date   CHOL 96 (L) 04/24/2021   HDL 50 04/24/2021   LDLCALC 33 04/24/2021   TRIG 52 04/24/2021   CHOLHDL 1.9 04/24/2021      Coronary artery disease involving native coronary artery of native heart, unspecified whether angina present Assessment & Plan: Continue hi potency statin,  ARB and beta blocker.  He is asymptomatic.  LDL is at goal  Lab Results  Component Value Date   CHOL 96 (L) 04/24/2021   HDL 50 04/24/2021   LDLCALC 33 04/24/2021   TRIG 52 04/24/2021   CHOLHDL 1.9 04/24/2021      Encounter for preventive health examination Assessment & Plan: age appropriate education and counseling updated, referrals for preventative services and immunizations addressed, dietary and smoking counseling addressed, most recent labs reviewed.  I have personally reviewed and have noted:   1) the patient's medical and social history 2) The pt's use of alcohol, tobacco, and illicit drugs 3) The patient's current medications and supplements 4) Functional ability including ADL's, fall risk, home safety risk, hearing and visual impairment 5) Diet and physical activities 6) Evidence for  depression or mood disorder 7) The patient's height, weight, and BMI have been recorded in the chart     I have made referrals, and provided counseling and education based on review of the above    Other orders -     Gabapentin; TAKE 2 CAPSULES BY MOUTH FOUR TIMES DAILY  Dispense: 240 capsule; Refill: 0 -     Amoxicillin; Take 4 capsules (2,000 mg total) by mouth once as needed for up to 1 dose. 2 HOURS PRIOR TO ANY PROCEDURE INCLUDING DENTAL CLEANING  Dispense: 4 capsule; Refill: 1    Return in about 6 months (around 07/12/2022).  Crecencio Mc, MD

## 2022-01-10 NOTE — Patient Instructions (Signed)
REFERRAL TO PULMONOLOGY CLINIC FOR NODULE FOLLOW UP    Please check your blood pressure a few times at home and send me the readings so I can determine if you need to CHANGE your medication to lower your blood pressure .

## 2022-01-12 NOTE — Assessment & Plan Note (Signed)
Continue stati for LDL or less .Marland Kitchen  LFTs normal  .  No changes today  Lab Results  Component Value Date   CHOL 96 (L) 04/24/2021   HDL 50 04/24/2021   LDLCALC 33 04/24/2021   TRIG 52 04/24/2021   CHOLHDL 1.9 04/24/2021

## 2022-01-12 NOTE — Assessment & Plan Note (Signed)
Annual ECHO reviewed.  Rx for prophylactic treatment prescribed

## 2022-01-12 NOTE — Assessment & Plan Note (Signed)
Continue hi potency statin,  ARB and beta blocker.  He is asymptomatic.  LDL is at goal  Lab Results  Component Value Date   CHOL 96 (L) 04/24/2021   HDL 50 04/24/2021   LDLCALC 33 04/24/2021   TRIG 52 04/24/2021   CHOLHDL 1.9 04/24/2021

## 2022-01-12 NOTE — Assessment & Plan Note (Addendum)
Referring to Pulmonary Clinic to follow up on nodules last imaged in April 2022.

## 2022-01-12 NOTE — Assessment & Plan Note (Signed)
Well controlled on current regimen of metoprolol and telmisartan . Renal function stable, no changes today.   Lab Results  Component Value Date   CREATININE 0.64 01/10/2022   Lab Results  Component Value Date   NA 139 01/10/2022   K 4.2 01/10/2022   CL 100 01/10/2022   CO2 30 01/10/2022

## 2022-01-12 NOTE — Assessment & Plan Note (Signed)
He has total revised his diet and has lost 12 lbs. However, his Repeat a1c is higher Lab Results  Component Value Date   HGBA1C 6.1 01/10/2022

## 2022-01-17 ENCOUNTER — Encounter: Payer: Self-pay | Admitting: Internal Medicine

## 2022-01-18 DIAGNOSIS — Z0000 Encounter for general adult medical examination without abnormal findings: Secondary | ICD-10-CM | POA: Insufficient documentation

## 2022-01-18 NOTE — Assessment & Plan Note (Signed)

## 2022-01-25 ENCOUNTER — Other Ambulatory Visit: Payer: Self-pay | Admitting: Cardiovascular Disease

## 2022-01-25 DIAGNOSIS — I25118 Atherosclerotic heart disease of native coronary artery with other forms of angina pectoris: Secondary | ICD-10-CM

## 2022-02-20 NOTE — Progress Notes (Unsigned)
Virtual Visit via Video Note  I connected with Dean Obrien on 02/21/22 at  8:30 AM EST by a video enabled telemedicine application and verified that I am speaking with the correct person using two identifiers.  Location: Patient: Home Provider: Office   I discussed the limitations of evaluation and management by telemedicine and the availability of in person appointments. The patient expressed understanding and agreed to proceed.  Shared Decision Making Visit Lung Cancer Screening Program [phone])   Eligibility: Age 62 y.o. Pack Years Smoking History Calculation 19 (# packs/per year x # years smoked) Recent History of coughing up blood  NO Unexplained weight loss? NO ( >Than 15 pounds within the last 6 months ) Prior History Lung / other cancer NO (Diagnosis within the last 5 years already requiring surveillance chest CT Scans). Smoking Status Former Smoker Former Smokers: Years since quit: 3 years  Quit Date: August 2021  Visit Components: Discussion included one or more decision making aids. yes Discussion included risk/benefits of screening. yes Discussion included potential follow up diagnostic testing for abnormal scans. yes Discussion included meaning and risk of over diagnosis. yes Discussion included meaning and risk of False Positives. yes Discussion included meaning of total radiation exposure. yes  Counseling Included: Importance of adherence to annual lung cancer LDCT screening. yes Impact of comorbidities on ability to participate in the program. yes Ability and willingness to under diagnostic treatment. yes  Smoking Cessation Counseling: Current Smokers:  Discussed importance of smoking cessation. yes Information about tobacco cessation classes and interventions provided to patient. yes Patient provided with "ticket" for LDCT Scan. NA Symptomatic Patient. no  Counseling(Intermediate counseling: > three minutes) 99406 Diagnosis Code: Tobacco Use  Z72.0 Asymptomatic Patient yes  Counseling (Intermediate counseling: > three minutes counseling) Q0347 Former Smokers:  Discussed the importance of maintaining cigarette abstinence. yes Diagnosis Code: Personal History of Nicotine Dependence. Q25.956 Information about tobacco cessation classes and interventions provided to patient. Yes Patient provided with "ticket" for LDCT Scan. NA Written Order for Lung Cancer Screening with LDCT placed in Epic. Yes (CT Chest Lung Cancer Screening Low Dose W/O CM) [id number] Z12.2-Screening of respiratory organs Z87.891-Personal history of nicotine dependence  I have spent 25 minutes of face to face/ virtual visit   time with Dean Obrien discussing the risks and benefits of lung cancer screening. We viewed / discussed a power point together that explained in detail the above noted topics. We paused at intervals to allow for questions to be asked and answered to ensure understanding.We discussed that the single most powerful action that he can take to decrease his risk of developing lung cancer is to quit smoking. We discussed whether or not he is ready to commit to setting a quit date. We discussed options for tools to aid in quitting smoking including nicotine replacement therapy, non-nicotine medications, support groups, Quit Smart classes, and behavior modification. We discussed that often times setting smaller, more achievable goals, such as eliminating 1 cigarette a day for a week and then 2 cigarettes a day for a week can be helpful in slowly decreasing the number of cigarettes smoked. This allows for a sense of accomplishment as well as providing a clinical benefit. I provided  him  with smoking cessation  information  with contact information for community resources, classes, free nicotine replacement therapy, and access to mobile apps, text messaging, and on-line smoking cessation help. I have also provided  him  the office contact information in the event he  needs to contact  me, or the screening staff. We discussed the time and location of the scan, and that either Doroteo Glassman RN, Joella Prince, RN  or I will call / send a letter with the results within 24-72 hours of receiving them. The patient verbalized understanding of all of  the above and had no further questions upon leaving the office. They have my contact information in the event they have any further questions.  I spent 3-5 minutes counseling on smoking cessation and the health risks of continued tobacco abuse.  I explained to the patient that there has been a high incidence of coronary artery disease noted on these exams. I explained that this is a non-gated exam therefore degree or severity cannot be determined. This patient is on statin therapy. I have asked the patient to follow-up with their PCP regarding any incidental finding of coronary artery disease and management with diet or medication as their PCP  feels is clinically indicated. The patient verbalized understanding of the above and had no further questions upon completion of the visit.   Martyn Ehrich, NP

## 2022-02-20 NOTE — Patient Instructions (Signed)
Thank you for participating in the  Lung Cancer Screening Program. It was our pleasure to meet you today. We will call you with the results of your scan within the next few days. Your scan will be assigned a Lung RADS category score by the physicians reading the scans.  This Lung RADS score determines follow up scanning.  See below for description of categories, and follow up screening recommendations. We will be in touch to schedule your follow up screening annually or based on recommendations of our providers. We will fax a copy of your scan results to your Primary Care Physician, or the physician who referred you to the program, to ensure they have the results. Please call the office if you have any questions or concerns regarding your scanning experience or results.  Our office number is [phone]. Please speak with Denise Phelps, RN. , or  Denise Buckner RN, They are  our Lung Cancer Screening RN.'s If They are unavailable when you call, Please leave a message on the voice mail. We will return your call at our earliest convenience.This voice mail is monitored several times a day.  Remember, if your scan is normal, we will scan you annually as long as you continue to meet the criteria for the program. (Age 50-80, Current smoker or smoker who has quit within the last 15 years). If you are a smoker, remember, quitting is the single most powerful action that you can take to decrease your risk of lung cancer and other pulmonary, breathing related problems. We know quitting is hard, and we are here to help.  Please let us know if there is anything we can do to help you meet your goal of quitting. If you are a former smoker, congratulations. We are proud of you! Remain smoke free! Remember you can refer friends or family members through the number above.  We will screen them to make sure they meet criteria for the program. Thank you for helping us take better care of you by  participating in Lung Screening.  You can receive free nicotine replacement therapy ( patches, gum or mints) by calling 1-800-QUIT NOW. Please call so we can get you on the path to becoming  a non-smoker. I know it is hard, but you can do this!  Lung RADS Categories:  Lung RADS 1: no nodules or definitely non-concerning nodules.  Recommendation is for a repeat annual scan in 12 months.  Lung RADS 2:  nodules that are non-concerning in appearance and behavior with a very low likelihood of becoming an active cancer. Recommendation is for a repeat annual scan in 12 months.  Lung RADS 3: nodules that are probably non-concerning , includes nodules with a low likelihood of becoming an active cancer.  Recommendation is for a 6-month repeat screening scan. Often noted after an upper respiratory illness. We will be in touch to make sure you have no questions, and to schedule your 6-month scan.  Lung RADS 4 A: nodules with concerning findings, recommendation is most often for a follow up scan in 3 months or additional testing based on our provider's assessment of the scan. We will be in touch to make sure you have no questions and to schedule the recommended 3 month follow up scan.  Lung RADS 4 B:  indicates findings that are concerning. We will be in touch with you to schedule additional diagnostic testing based on our provider's  assessment of the scan.  Other options for assistance in smoking cessation (   As covered by your insurance benefits)  Hypnosis for smoking cessation  [employer] [phone]  Acupuncture for smoking cessation  [facility] [phone]   

## 2022-02-21 ENCOUNTER — Other Ambulatory Visit: Payer: Self-pay | Admitting: Internal Medicine

## 2022-02-21 ENCOUNTER — Encounter: Payer: Self-pay | Admitting: Primary Care

## 2022-02-21 ENCOUNTER — Ambulatory Visit (INDEPENDENT_AMBULATORY_CARE_PROVIDER_SITE_OTHER): Admitting: Primary Care

## 2022-02-21 DIAGNOSIS — Z87891 Personal history of nicotine dependence: Secondary | ICD-10-CM | POA: Diagnosis not present

## 2022-02-22 ENCOUNTER — Other Ambulatory Visit: Payer: Self-pay | Admitting: Acute Care

## 2022-02-22 ENCOUNTER — Ambulatory Visit
Admission: RE | Admit: 2022-02-22 | Discharge: 2022-02-22 | Disposition: A | Discharge status: Home / Self Care | Source: Ambulatory Visit | Attending: Acute Care | Admitting: Acute Care

## 2022-02-22 DIAGNOSIS — Z87891 Personal history of nicotine dependence: Secondary | ICD-10-CM | POA: Diagnosis present

## 2022-02-22 DIAGNOSIS — Z122 Encounter for screening for malignant neoplasm of respiratory organs: Secondary | ICD-10-CM

## 2022-02-28 ENCOUNTER — Ambulatory Visit: Admitting: Pulmonary Disease

## 2022-02-28 ENCOUNTER — Encounter: Payer: Self-pay | Admitting: Pulmonary Disease

## 2022-02-28 VITALS — BP 128/84 | HR 62 | Temp 98.2°F | Ht 63.0 in | Wt 163.2 lb

## 2022-02-28 DIAGNOSIS — J432 Centrilobular emphysema: Secondary | ICD-10-CM

## 2022-02-28 DIAGNOSIS — R918 Other nonspecific abnormal finding of lung field: Secondary | ICD-10-CM | POA: Diagnosis not present

## 2022-02-28 DIAGNOSIS — Z953 Presence of xenogenic heart valve: Secondary | ICD-10-CM | POA: Diagnosis not present

## 2022-02-28 NOTE — Progress Notes (Signed)
Subjective:    Patient ID: Dean Obrien, male    DOB: Apr 30, 1960, 62 y.o.   MRN: [id number] Patient Care Team: Crecencio Mc, MD as PCP - General (Internal Medicine) Minna Merritts, MD as PCP - Cardiology (Cardiology) Tyler Pita, MD as Consulting Physician (Pulmonary Disease)  Chief Complaint  Patient presents with   Follow-up    Nodule. CT on 02/22/2022   Requesting MD/Service: Deborra Medina, MD Date of initial consultation:11/24/19 Reason for consultation: Multiple lung nodules noted on CT chest 09/12/2019   PT PROFILE: Former smoker with incidental finding of lung nodules 09/12/2019, at the time of evaluation for AVR and one-vessel CABG. He underwent AVR and one-vessel CABG at [facility] on 09/14/2019.   DATA: 09/12/2019 CT angio chest: No PE.  COPD changes.  Nodule in the left apex 8 x 6 x 6 mm, mean diameter 7 mm.  Small nodule at the left base measuring 3 mm.  Nodule at the anterior right lung measuring 4.5 mm, nodule on the posterior right lower lobe too small to characterize well.  Calcified nodule in the medial right upper lobe, likely old granuloma. 02/16/2020 PFTs: FEV1 2.4 L or 86% predicted, FVC 3.03 L or 81% predicted FEV1/FVC 80%, no bronchodilator response. Mild decrease in lung volumes, diffusion capacity normal.  Consistent with very mild restriction. 04/25/2020 chest CT without contrast: Scattered small pulmonary nodules unchanged or minimally decreased in size from 09/12/2019.  Emphysema. 12/27/2021 echocardiogram: LVEF 50 to 16%, A2 diastolic dysfunction, mild mitral regurgitation, aortic valve replaced with 25 mm Edwards bioprosthetic valve (09/14/2019). 02/22/2022 LDCT lung cancer screening: Lung RADS 2 benign appearance or behavior.  Scattered small lung nodules unchanged from prior.  No suspicious lung nodules.  Emphysema, status post CABG procedure.      INTERVAL: Last seen by me 24 February 2020.  At that time was being followed for multiple lung  nodules.  He is enrolled in lung cancer screening program.   HPI 62 year old  former smoker (quit 08/2019, 60 pack years) who presents for follow-up of multiple lung nodules incidentally found on CT scan of September 12, 2019.  At that time he was being evaluated for AV replacement due to critical aortic stenosis.  I last saw the patient on 24 February 2020 at that time he was asymptomatic after his aortic valve replacement and had not had any change in his imaging.  Since then he has been transition to the lung cancer screening program.  He had aortic valve replacement and coronary artery bypass grafting x2 on September 14, 2019.  He has been asymptomatic with regards to these multiple nodules. Patient has not had any cough, fevers, chills or sweats. He has not had any chest pain, orthopnea or paroxysmal nocturnal dyspnea. He has not had any dyspnea since his AV replacement and CABG.  CT scan of the chest also showed some mild emphysema.  He had simple spirometry on September 13, 2019 which was essentially normal with perhaps minimal obstructive airways disease.  No lung volumes or diffusion capacity.  He had pulmonary function test performed on 16 February 2020 that are consistent with very mild restriction this is not unusual after median sternotomy. Otherwise no overt obstruction.  Diffusion capacity was normal.   He continues to be asymptomatic with regards to dyspnea, cough, sputum production.  No hemoptysis.  No fevers, chills or sweats.  No weight loss or anorexia.  He remains abstinent of smoking.  He is retired from [employer] after  21 years.  He worked in Probation officer.  He works part-time at [employer] and remains active.  Does not note any pulmonary symptoms.  Most recent low-dose CT chest performed 22 February 2022 was reviewed with the patient.  It is a lung RADS 2 with no change in his scattered small lung nodules as previously noted.  Very mild emphysematous changes.   Review of  Systems A 10 point review of systems was performed and it is as noted above otherwise negative.  Past Medical History:  Diagnosis Date   Aortic atherosclerosis ([place])    Aortic stenosis, severe 09/09/2018   a.) RHC 09/10/19 Heavily calcified AV. VTI 0.42 cm. AV mean gradient 84.7 mmHg. Vmax measures 5.51 m/s. nl filling pressures, nl pulmonary pressure, nl CO; b.) underwent AVR (25 mm Inspiris-Resilia valve) on 09/14/2019   CAD (coronary artery disease)    LHC 09/10/2019: pRCA 90%s, pLCx 20%s, mLAD 50%s   Emphysema, unspecified (HCC)    Grade II diastolic dysfunction    Hx of CABG 09/14/2019   LIMA-LAD and RIMA-dRCA   Hyperlipidemia LDL goal <70    8/19 LDL 87   Hypertension    Lumbar spinal stenosis    Lumbar spondylosis    Multiple pulmonary nodules determined by computed tomography of lung    NSTEMI (non-ST elevated myocardial infarction) ([place]) 09/09/2019   Postoperative atrial fibrillation ([place]) 09/14/2019   Tx'd with amiodarone gtt; converted to NSR   Prediabetes    Past Surgical History:  Procedure Laterality Date   AORTIC VALVE REPLACEMENT N/A 09/14/2019   Procedure: AORTIC VALVE REPLACEMENT (AVR) USING INSPIRIS VALVE SIZE 25MM;  Surgeon: Wonda Olds, MD;  Location: [place];  Service: Open Heart Surgery;  Laterality: N/A;   CORONARY ARTERY BYPASS GRAFT N/A 09/14/2019   Procedure: CORONARY ARTERY BYPASS GRAFTING (CABG); LIMA-LAD and RIMA-dRCA;  Surgeon: Wonda Olds, MD;  Location: [place];  Service: Open Heart Surgery;  Laterality: N/A;   MASS EXCISION Right 08/28/2020   Procedure: EXCISION MASS;  Surgeon: Herbert Pun, MD;  Location: [facility];  Service: General;  Laterality: Right;   RIGHT/LEFT HEART CATH AND CORONARY ANGIOGRAPHY N/A 09/10/2019   Procedure: RIGHT/LEFT HEART CATH AND CORONARY ANGIOGRAPHY;  Surgeon: Wellington Hampshire, MD;  Location: [place] [facility];  Service: Cardiovascular;  Laterality: N/A;   TEE WITHOUT CARDIOVERSION N/A 09/14/2019    Procedure: TRANSESOPHAGEAL ECHOCARDIOGRAM (TEE);  Surgeon: Wonda Olds, MD;  Location: [place];  Service: Open Heart Surgery;  Laterality: N/A;   Patient Active Problem List   Diagnosis Date Noted   Encounter for preventive health examination 01/18/2022   Lipoma 07/07/2020   Sciatica, left side 12/31/2019   Type A blood, Rh positive 10/30/2019   Multiple pulmonary nodules determined by computed tomography of lung 10/09/2019   S/P CABG x 2 09/14/2019   S/P aortic valve replacement with bioprosthetic valve 09/14/2019   CAD (coronary artery disease) 09/10/2019   Prediabetes 09/10/2019   Hyperlipidemia LDL goal <70 09/10/2019   History of syncope 09/09/2019   Nicotine dependence 09/09/2019   HYPERTENSION, BENIGN 11/17/2009   Family History  Problem Relation Age of Onset   Emphysema Mother    Liver disease Mother    Lung cancer Father    Hypertension Sister    Hypertension Brother    Allergies  Allergen Reactions   Tramadol Itching, Nausea Only and Rash   Benadryl [Diphenhydramine] Other (See Comments)    Racing heart,    Current Meds  Medication  Sig   acetaminophen (TYLENOL) 500 MG tablet Take 500-1,000 mg by mouth every 6 (six) hours as needed (pain).   amoxicillin (AMOXIL) 500 MG capsule Take 4 capsules (2,000 mg total) by mouth once as needed for up to 1 dose. 2 HOURS PRIOR TO ANY PROCEDURE INCLUDING DENTAL CLEANING   aspirin EC 81 MG EC tablet Take 1 tablet (81 mg total) by mouth daily. Swallow whole. (Patient taking differently: Take 81 mg by mouth in the morning. Swallow whole.)   atorvastatin (LIPITOR) 40 MG tablet Take 1 tablet (40 mg total) by mouth daily.   fluticasone (FLONASE) 50 MCG/ACT nasal spray SHAKE AND SPRAY 2 SPRAYS IN EACH NOSTRIL ONCE DAILY   gabapentin (NEURONTIN) 100 MG capsule TAKE 2 CAPSULES BY MOUTH FOUR TIMES DAILY   HYDROcodone-acetaminophen (NORCO) 10-325 MG tablet Take 1 tablet by mouth every 6 (six) hours as needed.   loratadine (CLARITIN) 10  MG tablet Take 10 mg by mouth in the morning.   metoprolol tartrate (LOPRESSOR) 25 MG tablet One pill twice a day   naphazoline-pheniramine (ALLERGY EYE) 0.025-0.3 % ophthalmic solution Place 1-2 drops into both eyes in the morning.   telmisartan (MICARDIS) 20 MG tablet TAKE 1 TABLET(20 MG) BY MOUTH AT BEDTIME   traZODone (DESYREL) 50 MG tablet TAKE 1/2-1 TABLET BY MOUTH AT BEDTIME ASNEEDED FOR SLEEP    Immunization History  Administered Date(s) Administered   Hepatitis A, Adult 12/26/1994, 12/12/1995   Influenza Whole 01/23/1999, 12/20/1999, 12/10/2000   Influenza-Unspecified 11/03/2019, 11/13/2021   MMR 08/21/1980   OPV 08/21/1980   PFIZER(Purple Top)SARS-COV-2 Vaccination 04/16/2019, 05/14/2019, 12/25/2019   Td 12/12/1995   Tdap 01/10/2022   Zoster Recombinat (Shingrix) 07/04/2020       Objective:   Physical Exam BP 128/84 (BP Location: Left Arm, Cuff Size: Normal)   Pulse 62   Temp 98.2 F (36.8 C)   Ht '5\' 3"'$  (1.6 m)   Wt 163 lb 3.2 oz (74 kg)   SpO2 96%   BMI 28.91 kg/m   SpO2: 96 % O2 Device: None (Room air)  GENERAL: Well-developed, well-nourished, no acute distress.  Fully ambulatory, no conversational dyspnea. HEAD: Normocephalic, atraumatic.  EYES: Pupils equal, round, reactive to light.  No scleral icterus.  MOUTH: Dentition intact, oral mucosa moist.  No thrush.   NECK: Supple. No thyromegaly. Trachea midline. No JVD.  No adenopathy. PULMONARY: Good air entry bilaterally.  No adventitious sounds. CARDIOVASCULAR: S1 and S2. Regular rate and rhythm.   Valve sounds crisp, no overt murmur. ABDOMEN: Benign. MUSCULOSKELETAL: No joint deformity, no clubbing, no edema.  NEUROLOGIC: No focal deficit, no gait disturbance, speech is fluent. SKIN: Intact,warm,dry. PSYCH: Mood and behavior normal.     Assessment & Plan:     ICD-10-CM   1. Multiple lung nodules on CT  R91.8    Stable lung nodules Enrolled in lung cancer screening program Continue yearly  screening Next CT 02/2023    2. S/P aortic valve replacement with bioprosthetic valve  Z95.3    No sequela No dyspnea since replacement    3. Centrilobular emphysema + Paraseptal ([place]) - MILD  J43.2    Patient with well-preserved lung function  Asymptomatic     Patient is asymptomatic with regards to his very mild emphysema.  Lung nodules remain stable.  Remains abstinent of tobacco use.  He is to continue yearly lung cancer screening program.  Return as needed or as directed by the program.  C. Derrill Kay, MD Advanced Bronchoscopy PCCM [facility]    *  This note was dictated using voice recognition software/Dragon.  Despite best efforts to proofread, errors can occur which can change the meaning. Any transcriptional errors that result from this process are unintentional and may not be fully corrected at the time of dictation.

## 2022-02-28 NOTE — Patient Instructions (Signed)
Your nodules are stable.  Continue staying active as you are doing.  We will see you in follow-up as needed.

## 2022-03-12 ENCOUNTER — Other Ambulatory Visit: Payer: Self-pay | Admitting: Internal Medicine

## 2022-05-03 ENCOUNTER — Other Ambulatory Visit: Payer: Self-pay | Admitting: Cardiovascular Disease

## 2022-05-03 DIAGNOSIS — I25118 Atherosclerotic heart disease of native coronary artery with other forms of angina pectoris: Secondary | ICD-10-CM

## 2022-05-03 NOTE — Telephone Encounter (Signed)
Please contact pt for future appointment. ?Pt due for 12 month f/u. ?Pt needing refills. ?

## 2022-05-03 NOTE — Telephone Encounter (Signed)
Pt scheduled on 5/2

## 2022-05-17 ENCOUNTER — Other Ambulatory Visit: Payer: Self-pay | Admitting: Internal Medicine

## 2022-05-20 MED ORDER — GABAPENTIN 100 MG PO CAPS: ORAL_CAPSULE | ORAL | 0 refills | Status: DC

## 2022-05-22 NOTE — Progress Notes (Unsigned)
Cardiology Office Note:    Date:  05/23/2022   ID:  Dean Obrien, DOB 1960/12/13, MRN [id number]  PCP:  Sherlene Shams, MD  [facility] Cardiologist:  Julien Nordmann, MD  [facility] Electrophysiologist:  None   Referring MD: Sherlene Shams, MD   Chief Complaint: 12 month follow-up  History of Present Illness:    Dean Obrien is a 62 y.o. male with a hx of smoking, CAD, severe aortic stenosis, post-op Afib, CAD s/p CABG and aortic valve replacement 08/2019.  On 08/25/19 he underwent CABGx2 LIMA-LAD, RIMA-RCA and AVR with 25mm Edwards Inspiris bioprosthetic. Post-op, he went into afib and was treated with amiodarone drip.   Echo 10/2019 showed normally functioning valve.  Last seen 04/2021 and was stable from a cardiac perspective.   Echo from 12/2021 showed LVEF 50-55%, no Wma, G2DD, mild MR, AVR, normally functioning valve.   Today, the patient reports he is overall doing well. No significant changes since the last visit. Patient does not need refills of anything. He denies chest pain. Allergies affect breathing. No lower leg edema, or orthopnea. Labs in December. He works part-time at [employer] and stays active otherwise.    Past Medical History:  Diagnosis Date   Aortic atherosclerosis (HCC)    Aortic stenosis, severe 09/09/2018   a.) RHC 09/10/19 Heavily calcified AV. VTI 0.42 cm. AV mean gradient 84.7 mmHg. Vmax measures 5.51 m/s. nl filling pressures, nl pulmonary pressure, nl CO; b.) underwent AVR (25 mm Inspiris-Resilia valve) on 09/14/2019   CAD (coronary artery disease)    LHC 09/10/2019: pRCA 90%s, pLCx 20%s, mLAD 50%s   Emphysema, unspecified (HCC)    Grade II diastolic dysfunction    Hx of CABG 09/14/2019   LIMA-LAD and RIMA-dRCA   Hyperlipidemia LDL goal <70    8/19 LDL 87   Hypertension    Lumbar spinal stenosis    Lumbar spondylosis    Multiple pulmonary nodules determined by computed tomography of lung    NSTEMI (non-ST elevated myocardial  infarction) (HCC) 09/09/2019   Postoperative atrial fibrillation (HCC) 09/14/2019   Tx'd with amiodarone gtt; converted to NSR   Prediabetes     Past Surgical History:  Procedure Laterality Date   AORTIC VALVE REPLACEMENT N/A 09/14/2019   Procedure: AORTIC VALVE REPLACEMENT (AVR) USING INSPIRIS VALVE SIZE ;  Surgeon: Linden Dolin, MD;  Location: [facility] OR;  Service: Open Heart Surgery;  Laterality: N/A;   CORONARY ARTERY BYPASS GRAFT N/A 09/14/2019   Procedure: CORONARY ARTERY BYPASS GRAFTING (CABG); LIMA-LAD and RIMA-dRCA;  Surgeon: Linden Dolin, MD;  Location: [facility] OR;  Service: Open Heart Surgery;  Laterality: N/A;   MASS EXCISION Right 08/28/2020   Procedure: EXCISION MASS;  Surgeon: Carolan Shiver, MD;  Location: [facility];  Service: General;  Laterality: Right;   RIGHT/LEFT HEART CATH AND CORONARY ANGIOGRAPHY N/A 09/10/2019   Procedure: RIGHT/LEFT HEART CATH AND CORONARY ANGIOGRAPHY;  Surgeon: Iran Ouch, MD;  Location: [facility];  Service: Cardiovascular;  Laterality: N/A;   TEE WITHOUT CARDIOVERSION N/A 09/14/2019   Procedure: TRANSESOPHAGEAL ECHOCARDIOGRAM (TEE);  Surgeon: Linden Dolin, MD;  Location: [facility] OR;  Service: Open Heart Surgery;  Laterality: N/A;    Current Medications: Current Meds  Medication Sig   acetaminophen (TYLENOL) 500 MG tablet Take 500-1,000 mg by mouth every 6 (six) hours as needed (pain).   amoxicillin (AMOXIL) 500 MG capsule Take 4 capsules (2,000 mg total) by mouth once as needed for up to 1 dose.  2 HOURS PRIOR TO ANY PROCEDURE INCLUDING DENTAL CLEANING   aspirin EC 81 MG EC tablet Take 1 tablet (81 mg total) by mouth daily. Swallow whole. (Patient taking differently: Take 81 mg by mouth in the morning.)   atorvastatin (LIPITOR) 40 MG tablet Take 1 tablet (40 mg total) by mouth daily.   fluticasone (FLONASE) 50 MCG/ACT nasal spray SHAKE AND SPRAY 2 SPRAYS IN EACH NOSTRIL ONCE DAILY   gabapentin (NEURONTIN) 100 MG capsule  TAKE 2 CAPSULES BY MOUTH 4 TIMES DAILY   loratadine (CLARITIN) 10 MG tablet Take 10 mg by mouth in the morning.   metoprolol tartrate (LOPRESSOR) 25 MG tablet TAKE 1 TABLET BY MOUTH TWICE DAILY   naphazoline-pheniramine (ALLERGY EYE) 0.025-0.3 % ophthalmic solution Place 1-2 drops into both eyes in the morning.   telmisartan (MICARDIS) 20 MG tablet TAKE 1 TABLET(20 MG) BY MOUTH AT BEDTIME   traZODone (DESYREL) 50 MG tablet TAKE 1/2-1 TABLET BY MOUTH AT BEDTIME ASNEEDED FOR SLEEP     Allergies:   Tramadol and Benadryl [diphenhydramine]   Social History   Socioeconomic History   Marital status: Married    Spouse name: Not on file   Number of children: Not on file   Years of education: Not on file   Highest education level: Not on file  Occupational History   Occupation: Company secretary (desk job)    Comment: Retired   Occupation: Designer, jewellery in the dairy dept  Tobacco Use   Smoking status: Former    Packs/day: 1.50    Years: 40.00    Additional pack years: 0.00    Total pack years: 60.00    Types: Cigarettes    Quit date: 09/08/2019    Years since quitting: 2.7   Smokeless tobacco: Never  Vaping Use   Vaping Use: Never used  Substance and Sexual Activity   Alcohol use: No   Drug use: No   Sexual activity: Yes    Partners: Female    Birth control/protection: None  Other Topics Concern   Not on file  Social History Narrative   Pt gets regular exercise.   Social Determinants of Health   Financial Resource Strain: Not on file  Food Insecurity: Not on file  Transportation Needs: Not on file  Physical Activity: Not on file  Stress: Not on file  Social Connections: Not on file     Family History: The patient's family history includes Emphysema in his mother; Hypertension in his brother and sister; Liver disease in his mother; Lung cancer in his father.  ROS:   Please see the history of present illness.     All other systems reviewed and are negative.  EKGs/Labs/Other  Studies Reviewed:    The following studies were reviewed today:  Echo 12/2021 1. Left ventricular ejection fraction, by estimation, is 50 to 55%. The  left ventricle has low normal function. The left ventricle has no regional  wall motion abnormalities. Left ventricular diastolic parameters are  consistent with Grade II diastolic  dysfunction (pseudonormalization).   2. Right ventricular systolic function is normal. The right ventricular  size is normal.   3. The mitral valve is normal in structure. Mild mitral valve  regurgitation. No evidence of mitral stenosis.   4. The aortic valve has been repaired/replaced. Aortic valve  regurgitation is mild. There is a 25 mm Edwards bioprosthetic valve  present in the aortic position. Procedure Date: 09/14/19. Aortic valve mean  gradient measures 10.0 mmHg. Aortic valve Vmax  measures  2.16 m/s.   5. The inferior vena cava is normal in size with greater than 50%  respiratory variability, suggesting right atrial pressure of 3 mmHg.   Echo 10/2019  1. Left ventricular ejection fraction, by estimation, is 50 to 55%. The  left ventricle has low normal function. The left ventricle has no regional  wall motion abnormalities. Left ventricular diastolic parameters are  consistent with Grade II diastolic  dysfunction (pseudonormalization). Elevated left atrial pressure.   2. Right ventricular systolic function is moderately reduced. The right  ventricular size is normal.   3. A small pericardial effusion is present. The pericardial effusion is  circumferential.   4. The mitral valve is normal in structure. Trivial mitral valve  regurgitation. No evidence of mitral stenosis.   5. The aortic valve has been repaired/replaced. Aortic valve  regurgitation is not visualized. No aortic stenosis is present. There is a  25 mm Edwards Inspiris-Resilia valve present in the aortic position.  Procedure Date: 09/14/19.   6. There is borderline dilatation of the  ascending aorta, measuring 36  mm.   7. The inferior vena cava is normal in size with greater than 50%  respiratory variability, suggesting right atrial pressure of 3 mmHg.    EKG:  EKG is ordered today.  The ekg ordered today demonstrates NSR 64bpm, nonspecific ST changes  Recent Labs: 01/10/2022: ALT 22; BUN 15; Creatinine, Ser 0.64; Hemoglobin 15.4; Platelets 203.0; Potassium 4.2; Sodium 139; TSH 3.07  Recent Lipid Panel    Component Value Date/Time   CHOL 96 (L) 04/24/2021 1030   TRIG 52 04/24/2021 1030   HDL 50 04/24/2021 1030   CHOLHDL 1.9 04/24/2021 1030   CHOLHDL 2.9 09/09/2019 1323   VLDL 9 09/09/2019 1323   LDLCALC 33 04/24/2021 1030     Physical Exam:    VS:  BP 128/72 (BP Location: Left Arm, Patient Position: Sitting, Cuff Size: Normal)   Pulse 64   Ht 5\' 3"  (1.6 m)   Wt 163 lb (73.9 kg)   SpO2 97%   BMI 28.87 kg/m     Wt Readings from Last 3 Encounters:  05/23/22 163 lb (73.9 kg)  02/28/22 163 lb 3.2 oz (74 kg)  01/10/22 160 lb 12.8 oz (72.9 kg)     GEN:  Well nourished, well developed in no acute distress HEENT: Normal NECK: No JVD; No carotid bruits LYMPHATICS: No lymphadenopathy CARDIAC: RRR, + murmur, no rubs, gallops RESPIRATORY:  Clear to auscultation without rales, wheezing or rhonchi  ABDOMEN: Soft, non-tender, non-distended MUSCULOSKELETAL:  No edema; No deformity  SKIN: Warm and dry NEUROLOGIC:  Alert and oriented x 3 PSYCHIATRIC:  Normal affect   ASSESSMENT:    1. S/P aortic valve replacement with bioprosthetic valve   2. Diastolic dysfunction   3. Coronary artery disease of native artery of native heart with stable angina pectoris (HCC)   4. Hyperlipidemia LDL goal <70   5. S/P CABG x 2    PLAN:    In order of problems listed above:  Severe s/p AVR Diastolic dysfunction Echo in December 2023 showed normal LVEF, grade 2 diastolic dysfunction with normally functioning aortic valve.  The patient denies any shortness of breath.   Activity overall is good.  He is euvolemic on exam.  Continue metoprolol and telmisartan.  CAD s/p CABG x2 Patient denies any chest pain.  He has shortness of breath from allergies.  Continue aspirin, beta-blocker, statin.  Lung nodule This is followed by PCP.  HLD LDL  33 in 2023. I will update lipid panel today.  Continue Lipitor 40 mg daily.  Pre-diabetes Recent A1c 6.1.  This is followed by PCP  Disposition: Follow up in 1 year(s) with MD/APP    Signed, Amai Cappiello David Stall, PA-C  05/23/2022 10:51 AM    [facility]

## 2022-05-23 ENCOUNTER — Other Ambulatory Visit
Admission: RE | Admit: 2022-05-23 | Discharge: 2022-05-23 | Disposition: A | Discharge status: Home / Self Care | Source: Ambulatory Visit | Attending: Medical | Admitting: Medical

## 2022-05-23 ENCOUNTER — Encounter: Payer: Self-pay | Admitting: Medical

## 2022-05-23 ENCOUNTER — Ambulatory Visit: Attending: Medical | Admitting: Medical

## 2022-05-23 VITALS — BP 128/72 | HR 64 | Ht 63.0 in | Wt 163.0 lb

## 2022-05-23 DIAGNOSIS — Z951 Presence of aortocoronary bypass graft: Secondary | ICD-10-CM | POA: Diagnosis not present

## 2022-05-23 DIAGNOSIS — E785 Hyperlipidemia, unspecified: Secondary | ICD-10-CM | POA: Diagnosis not present

## 2022-05-23 DIAGNOSIS — Z953 Presence of xenogenic heart valve: Secondary | ICD-10-CM

## 2022-05-23 DIAGNOSIS — I25118 Atherosclerotic heart disease of native coronary artery with other forms of angina pectoris: Secondary | ICD-10-CM | POA: Diagnosis not present

## 2022-05-23 DIAGNOSIS — I5189 Other ill-defined heart diseases: Secondary | ICD-10-CM | POA: Diagnosis not present

## 2022-05-23 LAB — LIPID PANEL
Cholesterol: 94 mg/dL (ref 0–200)
HDL: 43 mg/dL (ref >40)
LDL Cholesterol: 40 mg/dL (ref 0–99)
Total CHOL/HDL Ratio: 2.2 RATIO
Triglycerides: 55 mg/dL (ref <150)
VLDL: 11 mg/dL (ref 0–40)

## 2022-05-23 LAB — LDL CHOLESTEROL, DIRECT: Direct LDL: 37 mg/dL (ref 0–99)

## 2022-05-23 NOTE — Patient Instructions (Signed)
Medication Instructions:  Your physician recommends that you continue on your current medications as directed. Please refer to the Current Medication list given to you today.  *If you need a refill on your cardiac medications before your next appointment, please call your pharmacy*   Lab Work: Your physician recommends that you return for lab work: Fasting lipid panel with direct LDL  [facility] Mall Entrance at [facility] 1st desk on the right to check in (REGISTRATION)  Lab hours: Monday- Friday (7:30 am- 5:30 pm)  If you have labs (blood work) drawn today and your tests are completely normal, you will receive your results only by: MyChart Message (if you have MyChart) OR A paper copy in the mail If you have any lab test that is abnormal or we need to change your treatment, we will call you to review the results.  Testing/Procedures: -None ordered   Follow-Up: At [facility], you and your health needs are our priority.  As part of our continuing mission to provide you with exceptional heart care, we have created designated Provider Care Teams.  These Care Teams include your primary Cardiologist (physician) and Advanced Practice Providers (APPs -  Physician Assistants and Nurse Practitioners) who all work together to provide you with the care you need, when you need it.  We recommend signing up for the patient portal called "MyChart".  Sign up information is provided on this After Visit Summary.  MyChart is used to connect with patients for Virtual Visits (Telemedicine).  Patients are able to view lab/test results, encounter notes, upcoming appointments, etc.  Non-urgent messages can be sent to your provider as well.   To learn more about what you can do with MyChart, go to [email or web address].    Your next appointment:   1 year(s)  Provider:   Julien Nordmann, MD   Other Instructions -None

## 2022-05-28 ENCOUNTER — Other Ambulatory Visit: Payer: Self-pay | Admitting: Cardiovascular Disease

## 2022-05-28 ENCOUNTER — Telehealth: Payer: Self-pay

## 2022-05-28 NOTE — Telephone Encounter (Signed)
Opened in error

## 2022-06-19 ENCOUNTER — Other Ambulatory Visit: Payer: Self-pay | Admitting: Internal Medicine

## 2022-07-02 ENCOUNTER — Other Ambulatory Visit: Payer: Self-pay | Admitting: Cardiovascular Disease

## 2022-07-02 DIAGNOSIS — E785 Hyperlipidemia, unspecified: Secondary | ICD-10-CM

## 2022-07-12 ENCOUNTER — Ambulatory Visit: Admitting: Internal Medicine

## 2022-07-15 ENCOUNTER — Other Ambulatory Visit: Payer: Self-pay | Admitting: Internal Medicine

## 2022-07-15 MED ORDER — GABAPENTIN 100 MG PO CAPS: ORAL_CAPSULE | ORAL | 0 refills | Status: DC

## 2022-07-19 ENCOUNTER — Ambulatory Visit: Admitting: Internal Medicine

## 2022-07-19 ENCOUNTER — Encounter: Payer: Self-pay | Admitting: Internal Medicine

## 2022-07-19 VITALS — BP 127/76 | HR 69 | Temp 98.0°F | Ht 63.0 in | Wt 164.0 lb

## 2022-07-19 DIAGNOSIS — C44311 Basal cell carcinoma of skin of nose: Secondary | ICD-10-CM

## 2022-07-19 DIAGNOSIS — I2510 Atherosclerotic heart disease of native coronary artery without angina pectoris: Secondary | ICD-10-CM

## 2022-07-19 DIAGNOSIS — E785 Hyperlipidemia, unspecified: Secondary | ICD-10-CM | POA: Diagnosis not present

## 2022-07-19 DIAGNOSIS — R7303 Prediabetes: Secondary | ICD-10-CM | POA: Diagnosis not present

## 2022-07-19 DIAGNOSIS — I10 Essential (primary) hypertension: Secondary | ICD-10-CM

## 2022-07-19 MED ORDER — GABAPENTIN 100 MG PO CAPS: ORAL_CAPSULE | ORAL | 6 refills | Status: DC

## 2022-07-19 MED ORDER — TRAZODONE HCL 50 MG PO TABS: ORAL_TABLET | ORAL | 0 refills | Status: DC

## 2022-07-19 MED ORDER — DOXYCYCLINE HYCLATE 100 MG PO TABS: 100.0000 mg | ORAL_TABLET | Freq: Two times a day (BID) | ORAL | 0 refills | Status: DC

## 2022-07-19 MED ORDER — GABAPENTIN 100 MG PO CAPS: ORAL_CAPSULE | ORAL | 0 refills | Status: DC

## 2022-07-19 NOTE — Progress Notes (Unsigned)
Subjective:  Patient ID: Dean Obrien, male    DOB: [birth year]  Age: 62 y.o. MRN: [id number]  CC: The primary encounter diagnosis was Prediabetes. Diagnoses of Basal cell carcinoma of dorsum of nose, Coronary artery disease involving native coronary artery of native heart, unspecified whether angina present, Hyperlipidemia LDL goal <70, and HYPERTENSION, BENIGN were also pertinent to this visit.   HPI Dean Obrien presents for  Chief Complaint  Patient presents with   Medical Management of Chronic Issues    6 month follow up    Feels generally well,   taking his medications as directed.     CAD:  HE REMAINS Asymptomatic and has had annual follow up with Dr Mariah Milling . No medication changes were made.   S/p Aortic valve replacement:    HTN:  Hypertension: patient checks blood pressure  once  weekly at home.  Readings have been <  130/80 at rest . Patient is following a reduce salt diet most days and is taking medications as prescribed   Enlarging lesion on nose resent for 8 years,  has recently increased  in size ,  does not bleed . Has not seen dermatology    Outpatient Medications Prior to Visit  Medication Sig Dispense Refill   acetaminophen (TYLENOL) 500 MG tablet Take 500-1,000 mg by mouth every 6 (six) hours as needed (pain).     amoxicillin (AMOXIL) 500 MG capsule Take 4 capsules (2,000 mg total) by mouth once as needed for up to 1 dose. 2 HOURS PRIOR TO ANY PROCEDURE INCLUDING DENTAL CLEANING 4 capsule 1   aspirin EC 81 MG EC tablet Take 1 tablet (81 mg total) by mouth daily. Swallow whole. (Patient taking differently: Take 81 mg by mouth in the morning.) 30 tablet 11   atorvastatin (LIPITOR) 40 MG tablet TAKE 1 TABLET BY MOUTH ONCE EVERY EVENING 90 tablet 2   fluticasone (FLONASE) 50 MCG/ACT nasal spray SHAKE AND SPRAY 2 SPRAYS IN EACH NOSTRIL ONCE DAILY 48 g 3   loratadine (CLARITIN) 10 MG tablet Take 10 mg by mouth in the morning.     naphazoline-pheniramine (ALLERGY  EYE) 0.025-0.3 % ophthalmic solution Place 1-2 drops into both eyes in the morning.     telmisartan (MICARDIS) 20 MG tablet TAKE 1 TABLET BY MOUTH AT BEDTIME 90 tablet 3   gabapentin (NEURONTIN) 100 MG capsule TAKE 2 CAPSULES BY MOUTH 4 TIMES DAILY 240 capsule 0   metoprolol tartrate (LOPRESSOR) 25 MG tablet TAKE 1 TABLET BY MOUTH TWICE DAILY 180 tablet 0   traZODone (DESYREL) 50 MG tablet TAKE 1/2-1 TABLET BY MOUTH AT BEDTIME ASNEEDED FOR SLEEP 90 tablet 0   HYDROcodone-acetaminophen (NORCO) 10-325 MG tablet Take 1 tablet by mouth every 6 (six) hours as needed. (Patient not taking: Reported on 07/19/2022) 30 tablet 0   No facility-administered medications prior to visit.    Review of Systems;  Patient denies headache, fevers, malaise, unintentional weight loss, skin rash, eye pain, sinus congestion and sinus pain, sore throat, dysphagia,  hemoptysis , cough, dyspnea, wheezing, chest pain, palpitations, orthopnea, edema, abdominal pain, nausea, melena, diarrhea, constipation, flank pain, dysuria, hematuria, urinary  Frequency, nocturia, numbness, tingling, seizures,  Focal weakness, Loss of consciousness,  Tremor, insomnia, depression, anxiety, and suicidal ideation.      Objective:  BP 127/76   Pulse 69   Temp 98 F (36.7 C) (Oral)   Ht 5\' 3"  (1.6 m)   Wt 164 lb (74.4 kg)   SpO2 94%   BMI  29.05 kg/m   BP Readings from Last 3 Encounters:  07/19/22 127/76  05/23/22 128/72  02/28/22 128/84    Wt Readings from Last 3 Encounters:  07/19/22 164 lb (74.4 kg)  05/23/22 163 lb (73.9 kg)  02/28/22 163 lb 3.2 oz (74 kg)    Physical Exam Vitals reviewed.  Constitutional:      General: He is not in acute distress.    Appearance: Normal appearance. He is normal weight. He is not ill-appearing, toxic-appearing or diaphoretic.  HENT:     Head: Normocephalic.     Comments: Pink papular lesion measuring 4 mm on dorsum of nose suspcisious for basal cell CA Eyes:     General: No scleral  icterus.       Right eye: No discharge.        Left eye: No discharge.     Conjunctiva/sclera: Conjunctivae normal.  Cardiovascular:     Rate and Rhythm: Normal rate and regular rhythm.     Heart sounds: Normal heart sounds.  Pulmonary:     Effort: Pulmonary effort is normal. No respiratory distress.     Breath sounds: Normal breath sounds.  Musculoskeletal:        General: Normal range of motion.     Cervical back: Normal range of motion.  Skin:    General: Skin is warm and dry.     Findings: Lesion present.  Neurological:     General: No focal deficit present.     Mental Status: He is alert and oriented to person, place, and time. Mental status is at baseline.  Psychiatric:        Mood and Affect: Mood normal.        Behavior: Behavior normal.        Thought Content: Thought content normal.        Judgment: Judgment normal.    Lab Results  Component Value Date   HGBA1C 6.1 (H) 07/19/2022   HGBA1C 6.1 01/10/2022   HGBA1C 5.8 (H) 09/13/2019    Lab Results  Component Value Date   CREATININE 0.75 07/19/2022   CREATININE 0.64 01/10/2022   CREATININE 0.71 07/05/2020    Lab Results  Component Value Date   WBC 9.1 01/10/2022   HGB 15.4 01/10/2022   HCT 45.5 01/10/2022   PLT 203.0 01/10/2022   GLUCOSE 98 07/19/2022   CHOL 94 05/23/2022   TRIG 55 05/23/2022   HDL 43 05/23/2022   LDLDIRECT 37 05/23/2022   LDLCALC 40 05/23/2022   ALT 21 07/19/2022   AST 19 07/19/2022   NA 140 07/19/2022   K 3.9 07/19/2022   CL 105 07/19/2022   CREATININE 0.75 07/19/2022   BUN 16 07/19/2022   CO2 27 07/19/2022   TSH 3.07 01/10/2022   PSA 1.16 01/10/2022   INR 1.7 (H) 09/14/2019   HGBA1C 6.1 (H) 07/19/2022   MICROALBUR <0.7 01/10/2022    No results found.  Assessment & Plan:  .Prediabetes Assessment & Plan: He has total revised his diet and has lost 12 lbs. A1c remains mildly elevated   Lab Results  Component Value Date   HGBA1C 6.1 (H) 07/19/2022     Orders: -      Comprehensive metabolic panel -     Hemoglobin A1c  Basal cell carcinoma of dorsum of nose -     Ambulatory referral to Dermatology  Coronary artery disease involving native coronary artery of native heart, unspecified whether angina present Assessment & Plan: S/p CABGx2 LIMA-LAD, RIMA-RCA and AVR  with 25mm Edwards Inspiris bioprosthetic In August 2021 .  He remains asymptomatic. Marland KitchenContinue hi potency statin,  ARB and beta blocker.  He is asymptomatic.  LDL is at goal  Lab Results  Component Value Date   CHOL 94 05/23/2022   HDL 43 05/23/2022   LDLCALC 40 05/23/2022   LDLDIRECT 37 05/23/2022   TRIG 55 05/23/2022   CHOLHDL 2.2 05/23/2022      Hyperlipidemia LDL goal <70 Assessment & Plan: LDL and triglycerides are at goal on current medications. He has no side effects and liver enzymes are normal. No changes today   Lab Results  Component Value Date   CHOL 94 05/23/2022   HDL 43 05/23/2022   LDLCALC 40 05/23/2022   LDLDIRECT 37 05/23/2022   TRIG 55 05/23/2022   CHOLHDL 2.2 05/23/2022      HYPERTENSION, BENIGN Assessment & Plan: Well controlled on current regimen of metoprolol and telmisartan . Renal function stable, no changes today.   Lab Results  Component Value Date   CREATININE 0.75 07/19/2022   Lab Results  Component Value Date   NA 140 07/19/2022   K 3.9 07/19/2022   CL 105 07/19/2022   CO2 27 07/19/2022      Other orders -     traZODone HCl; TAKE 1/2-1 TABLET BY MOUTH AT BEDTIME ASNEEDED FOR SLEEP  Dispense: 90 tablet; Refill: 0 -     Gabapentin; TAKE 2 CAPSULES BY MOUTH 4 TIMES DAILY  Dispense: 240 capsule; Refill: 6 -     Doxycycline Hyclate; Take 1 tablet (100 mg total) by mouth 2 (two) times daily.  Dispense: 20 tablet; Refill: 0    Follow-up: No follow-ups on file.   Sherlene Shams, MD

## 2022-07-19 NOTE — Patient Instructions (Signed)
YOUR GOAL BLOOD PRESSURE IS   < 130/80 AND > 110/60.  You are AT GOAL.    [facility] Spotted Fever  can present with fever and a headache, NOT a rash; s the occurrence of these symptoms during spring/summer/fall in the context of recent tick exposure is RMSF until proven otherwise and should be treated immediately with doxycycline.  I have sent this rx to your pharmacy to use if you develop these symptoms.  Let me know if this occurs so we can set you up for the appropriate testing .

## 2022-07-20 LAB — COMPREHENSIVE METABOLIC PANEL
AG Ratio: 1.5 (calc) (ref 1.0–2.5)
ALT: 21 U/L (ref 9–46)
AST: 19 U/L (ref 10–35)
Albumin: 4.3 g/dL (ref 3.6–5.1)
Alkaline phosphatase (APISO): 63 U/L (ref 35–144)
BUN: 16 mg/dL (ref 7–25)
CO2: 27 mmol/L (ref 20–32)
Calcium: 9.7 mg/dL (ref 8.6–10.3)
Chloride: 105 mmol/L (ref 98–110)
Creat: 0.75 mg/dL (ref 0.70–1.35)
Globulin: 2.8 g/dL (calc) (ref 1.9–3.7)
Glucose, Bld: 98 mg/dL (ref 65–99)
Potassium: 3.9 mmol/L (ref 3.5–5.3)
Sodium: 140 mmol/L (ref 135–146)
Total Bilirubin: 0.5 mg/dL (ref 0.2–1.2)
Total Protein: 7.1 g/dL (ref 6.1–8.1)

## 2022-07-20 LAB — HEMOGLOBIN A1C
Hgb A1c MFr Bld: 6.1 % of total Hgb — ABNORMAL HIGH (ref <5.7)
Mean Plasma Glucose: 128 mg/dL
eAG (mmol/L): 7.1 mmol/L

## 2022-07-21 ENCOUNTER — Other Ambulatory Visit: Payer: Self-pay | Admitting: Cardiovascular Disease

## 2022-07-21 DIAGNOSIS — I25118 Atherosclerotic heart disease of native coronary artery with other forms of angina pectoris: Secondary | ICD-10-CM

## 2022-07-21 MED ORDER — METOPROLOL TARTRATE 25 MG PO TABS: ORAL_TABLET | ORAL | 0 refills | Status: DC

## 2022-07-21 NOTE — Assessment & Plan Note (Signed)
He has total revised his diet and has lost 12 lbs. A1c remains mildly elevated   Lab Results  Component Value Date   HGBA1C 6.1 (H) 07/19/2022

## 2022-07-21 NOTE — Assessment & Plan Note (Addendum)
S/p CABGx2 LIMA-LAD, RIMA-RCA and AVR with 25mm Edwards Inspiris bioprosthetic In August 2021 .  He remains asymptomatic. Marland KitchenContinue hi potency statin,  ARB and beta blocker.  He is asymptomatic.  LDL is at goal  Lab Results  Component Value Date   CHOL 94 05/23/2022   HDL 43 05/23/2022   LDLCALC 40 05/23/2022   LDLDIRECT 37 05/23/2022   TRIG 55 05/23/2022   CHOLHDL 2.2 05/23/2022

## 2022-07-21 NOTE — Assessment & Plan Note (Signed)
LDL and triglycerides are at goal on current medications. He has no side effects and liver enzymes are normal. No changes today   Lab Results  Component Value Date   CHOL 94 05/23/2022   HDL 43 05/23/2022   LDLCALC 40 05/23/2022   LDLDIRECT 37 05/23/2022   TRIG 55 05/23/2022   CHOLHDL 2.2 05/23/2022

## 2022-07-21 NOTE — Assessment & Plan Note (Signed)
Well controlled on current regimen of metoprolol and telmisartan . Renal function stable, no changes today.   Lab Results  Component Value Date   CREATININE 0.75 07/19/2022   Lab Results  Component Value Date   NA 140 07/19/2022   K 3.9 07/19/2022   CL 105 07/19/2022   CO2 27 07/19/2022

## 2022-09-04 ENCOUNTER — Other Ambulatory Visit: Payer: Self-pay | Admitting: Cardiovascular Disease

## 2022-09-04 DIAGNOSIS — E785 Hyperlipidemia, unspecified: Secondary | ICD-10-CM

## 2022-09-11 ENCOUNTER — Ambulatory Visit: Admitting: Dermatology

## 2022-09-11 VITALS — BP 141/70 | HR 65

## 2022-09-11 DIAGNOSIS — L738 Other specified follicular disorders: Secondary | ICD-10-CM

## 2022-09-11 DIAGNOSIS — L578 Other skin changes due to chronic exposure to nonionizing radiation: Secondary | ICD-10-CM | POA: Diagnosis not present

## 2022-09-11 DIAGNOSIS — C44311 Basal cell carcinoma of skin of nose: Secondary | ICD-10-CM | POA: Diagnosis not present

## 2022-09-11 DIAGNOSIS — C4491 Basal cell carcinoma of skin, unspecified: Secondary | ICD-10-CM

## 2022-09-11 DIAGNOSIS — D485 Neoplasm of uncertain behavior of skin: Secondary | ICD-10-CM

## 2022-09-11 DIAGNOSIS — W908XXA Exposure to other nonionizing radiation, initial encounter: Secondary | ICD-10-CM | POA: Diagnosis not present

## 2022-09-11 HISTORY — DX: Basal cell carcinoma of skin, unspecified: C44.91

## 2022-09-11 NOTE — Progress Notes (Unsigned)
New Patient Visit   Subjective  Dean Obrien is a 62 y.o. male who presents for the following: Irregular skin lesion on the nose x 8-9 years growing larger and changing over the past 6 months.  The patient has spots, moles and lesions to be evaluated, some may be new or changing and the patient may have concern these could be cancer.   The following portions of the chart were reviewed this encounter and updated as appropriate: medications, allergies, medical history  Review of Systems:  No other skin or systemic complaints except as noted in HPI or Assessment and Plan.  Objective  Well appearing patient in no apparent distress; mood and affect are within normal limits.    A focused examination was performed of the following areas: the face   Relevant exam findings are noted in the Assessment and Plan.  R dorsum distal nose 1.2 cm pink papule.     R dorsum distal nose sup edge of flat indurated plaque Flat indurated plaque 2.1 x 1.7 cm        Assessment & Plan   ACTINIC DAMAGE - chronic, secondary to cumulative UV radiation exposure/sun exposure over time - diffuse scaly erythematous macules with underlying dyspigmentation - Recommend daily broad spectrum sunscreen SPF 30+ to sun-exposed areas, reapply every 2 hours as needed.  - Recommend staying in the shade or wearing long sleeves, sun glasses (UVA+UVB protection) and wide brim hats (4-inch brim around the entire circumference of the hat). - Call for new or changing lesions.  Neoplasm of uncertain behavior of skin (2) R dorsum distal nose  Epidermal / dermal shaving  Lesion diameter (cm):  1.2 Informed consent: discussed and consent obtained   Timeout: patient name, date of birth, surgical site, and procedure verified   Procedure prep:  Patient was prepped and draped in usual sterile fashion Prep type:  Isopropyl alcohol Anesthesia: the lesion was anesthetized in a standard fashion   Anesthetic:  1%  lidocaine w/ epinephrine 1-100,000 buffered w/ 8.4% NaHCO3 Instrument used: flexible razor blade   Hemostasis achieved with: pressure, aluminum chloride and electrodesiccation   Outcome: patient tolerated procedure well   Post-procedure details: sterile dressing applied and wound care instructions given   Dressing type: bandage and petrolatum    Destruction of lesion Complexity: extensive   Destruction method: electrodesiccation and curettage   Informed consent: discussed and consent obtained   Timeout:  patient name, date of birth, surgical site, and procedure verified Procedure prep:  Patient was prepped and draped in usual sterile fashion Prep type:  Isopropyl alcohol Anesthesia: the lesion was anesthetized in a standard fashion   Anesthetic:  1% lidocaine w/ epinephrine 1-100,000 buffered w/ 8.4% NaHCO3 Curettage performed in three different directions: Yes   Electrodesiccation performed over the curetted area: Yes   Lesion length (cm):  1.2 Lesion width (cm):  1.2 Margin per side (cm):  0.2 Final wound size (cm):  1.6 Hemostasis achieved with:  pressure, aluminum chloride and electrodesiccation Outcome: patient tolerated procedure well with no complications   Post-procedure details: sterile dressing applied and wound care instructions given   Dressing type: bandage and petrolatum    Specimen 1 - Surgical pathology Differential Diagnosis: D48.5 r/o BCC ED&C today Check Margins: No  R dorsum distal nose sup edge of flat indurated plaque  Skin / nail biopsy Type of biopsy: tangential   Informed consent: discussed and consent obtained   Timeout: patient name, date of birth, surgical site, and procedure verified  Procedure prep:  Patient was prepped and draped in usual sterile fashion Prep type:  Isopropyl alcohol Anesthesia: the lesion was anesthetized in a standard fashion   Anesthetic:  1% lidocaine w/ epinephrine 1-100,000 buffered w/ 8.4% NaHCO3 Instrument used:  flexible razor blade   Hemostasis achieved with: pressure, aluminum chloride and electrodesiccation   Outcome: patient tolerated procedure well   Post-procedure details: sterile dressing applied and wound care instructions given   Dressing type: bandage and petrolatum    Specimen 2 - Surgical pathology Differential Diagnosis: D48.5 r/o BCC vs other Check Margins: No   Return in about 3 months (around 12/12/2022) for bx recheck.  Maylene Roes, CMA, am acting as scribe for Armida Sans, MD .  Documentation: I have reviewed the above documentation for accuracy and completeness, and I agree with the above.  Armida Sans, MD

## 2022-09-11 NOTE — Patient Instructions (Addendum)

## 2022-09-19 ENCOUNTER — Encounter: Payer: Self-pay | Admitting: Dermatology

## 2022-09-19 ENCOUNTER — Telehealth: Payer: Self-pay

## 2022-09-19 NOTE — Telephone Encounter (Signed)
-----   Message from Armida Sans sent at 09/18/2022  6:37 PM EDT ----- Diagnosis 1. Skin , R dorsum distal nose BASAL CELL CARCINOMA, NODULAR PATTERN 2. Skin , dorsum distal nose sup edge of flat indurated plaque SEBACEOUS GLAND HYPERPLASIA  1- Cancer = BCC Already treated Recheck next visit 2- benign enlarged oil gland No treatment needed at this time Recheck next visit

## 2022-09-19 NOTE — Telephone Encounter (Signed)
Discussed biopsy results with pt, keep follow up appt

## 2022-10-14 ENCOUNTER — Other Ambulatory Visit: Payer: Self-pay | Admitting: Internal Medicine

## 2022-10-17 ENCOUNTER — Other Ambulatory Visit: Payer: Self-pay | Admitting: Internal Medicine

## 2022-11-06 ENCOUNTER — Other Ambulatory Visit: Payer: Self-pay

## 2022-11-06 DIAGNOSIS — I25118 Atherosclerotic heart disease of native coronary artery with other forms of angina pectoris: Secondary | ICD-10-CM

## 2022-11-06 MED ORDER — METOPROLOL TARTRATE 25 MG PO TABS
ORAL_TABLET | ORAL | 3 refills | Status: DC
Start: 2022-11-06 — End: 2023-10-14

## 2022-12-26 ENCOUNTER — Ambulatory Visit: Admitting: Dermatology

## 2022-12-26 DIAGNOSIS — D692 Other nonthrombocytopenic purpura: Secondary | ICD-10-CM | POA: Diagnosis not present

## 2022-12-26 DIAGNOSIS — Z85828 Personal history of other malignant neoplasm of skin: Secondary | ICD-10-CM

## 2022-12-26 DIAGNOSIS — L821 Other seborrheic keratosis: Secondary | ICD-10-CM

## 2022-12-26 DIAGNOSIS — L820 Inflamed seborrheic keratosis: Secondary | ICD-10-CM | POA: Diagnosis not present

## 2022-12-26 DIAGNOSIS — L578 Other skin changes due to chronic exposure to nonionizing radiation: Secondary | ICD-10-CM | POA: Diagnosis not present

## 2022-12-26 DIAGNOSIS — W908XXA Exposure to other nonionizing radiation, initial encounter: Secondary | ICD-10-CM | POA: Diagnosis not present

## 2022-12-26 NOTE — Patient Instructions (Signed)

## 2022-12-26 NOTE — Progress Notes (Signed)
   Follow-Up Visit   Subjective  Dean Obrien is a 62 y.o. male who presents for the following: Recheck BCC of the R dorsum distal nose, tx with ED&C.   The following portions of the chart were reviewed this encounter and updated as appropriate: medications, allergies, medical history  Review of Systems:  No other skin or systemic complaints except as noted in HPI or Assessment and Plan.  Objective  Well appearing patient in no apparent distress; mood and affect are within normal limits.   A focused examination was performed of the following areas: the face, hands, and arms.   Relevant exam findings are noted in the Assessment and Plan.  R temple x 3, R tricep x 1 (4) Erythematous stuck-on, waxy papule or plaque    Assessment & Plan   HISTORY OF BASAL CELL CARCINOMA OF THE SKIN - R dorsum distal nose - No evidence of recurrence today - Recommend regular full body skin exams - Recommend daily broad spectrum sunscreen SPF 30+ to sun-exposed areas, reapply every 2 hours as needed.  - Call if any new or changing lesions are noted between office visits  ACTINIC DAMAGE - chronic, secondary to cumulative UV radiation exposure/sun exposure over time - diffuse scaly erythematous macules with underlying dyspigmentation - Recommend daily broad spectrum sunscreen SPF 30+ to sun-exposed areas, reapply every 2 hours as needed.  - Recommend staying in the shade or wearing long sleeves, sun glasses (UVA+UVB protection) and wide brim hats (4-inch brim around the entire circumference of the hat). - Call for new or changing lesions.  SEBORRHEIC KERATOSIS - Stuck-on, waxy, tan-brown papules and/or plaques  - Benign-appearing - Discussed benign etiology and prognosis. - Observe - Call for any changes  Inflamed seborrheic keratosis (4) R temple x 3, R tricep x 1  Symptomatic, irritating, patient would like treated.   Destruction of lesion - R temple x 3, R tricep x 1 (4) Complexity:  simple   Destruction method: cryotherapy   Informed consent: discussed and consent obtained   Timeout:  patient name, date of birth, surgical site, and procedure verified Lesion destroyed using liquid nitrogen: Yes   Region frozen until ice ball extended beyond lesion: Yes   Outcome: patient tolerated procedure well with no complications   Post-procedure details: wound care instructions given    Purpura - Chronic; persistent and recurrent.  Treatable, but not curable. - Violaceous macules and patches - Benign - Related to trauma, age, sun damage and/or use of blood thinners, chronic use of topical and/or oral steroids - Observe - Can use OTC arnica containing moisturizer such as Dermend Bruise Formula if desired - Call for worsening or other concerns  Return in about 1 year (around 12/26/2023) for TBSE.  Maylene Roes, CMA, am acting as scribe for Armida Sans, MD .   Documentation: I have reviewed the above documentation for accuracy and completeness, and I agree with the above.  Armida Sans, MD

## 2022-12-28 ENCOUNTER — Other Ambulatory Visit: Payer: Self-pay | Admitting: Internal Medicine

## 2022-12-31 ENCOUNTER — Encounter: Payer: Self-pay | Admitting: Dermatology

## 2022-12-31 MED ORDER — TRAZODONE HCL 50 MG PO TABS: ORAL_TABLET | ORAL | 0 refills | Status: DC

## 2023-01-09 ENCOUNTER — Other Ambulatory Visit: Payer: Self-pay | Admitting: Acute Care

## 2023-01-09 DIAGNOSIS — Z122 Encounter for screening for malignant neoplasm of respiratory organs: Secondary | ICD-10-CM

## 2023-02-07 ENCOUNTER — Encounter: Payer: Self-pay | Admitting: Internal Medicine

## 2023-02-24 ENCOUNTER — Ambulatory Visit
Admission: RE | Admit: 2023-02-24 | Discharge: 2023-02-24 | Disposition: A | Discharge status: Home / Self Care | Source: Ambulatory Visit | Attending: Acute Care | Admitting: Acute Care

## 2023-02-24 DIAGNOSIS — Z122 Encounter for screening for malignant neoplasm of respiratory organs: Secondary | ICD-10-CM | POA: Insufficient documentation

## 2023-02-24 DIAGNOSIS — J439 Emphysema, unspecified: Secondary | ICD-10-CM | POA: Diagnosis not present

## 2023-02-24 DIAGNOSIS — Z87891 Personal history of nicotine dependence: Secondary | ICD-10-CM | POA: Diagnosis not present

## 2023-02-24 DIAGNOSIS — I700 Atherosclerosis of aorta: Secondary | ICD-10-CM | POA: Insufficient documentation

## 2023-02-26 ENCOUNTER — Other Ambulatory Visit: Payer: Self-pay | Admitting: Internal Medicine

## 2023-03-04 ENCOUNTER — Encounter: Payer: Self-pay | Admitting: Internal Medicine

## 2023-03-04 ENCOUNTER — Telehealth: Admitting: Internal Medicine

## 2023-03-04 ENCOUNTER — Other Ambulatory Visit: Payer: Self-pay | Admitting: Internal Medicine

## 2023-03-04 VITALS — BP 132/71 | HR 77 | Ht 63.0 in | Wt 160.0 lb

## 2023-03-04 DIAGNOSIS — E785 Hyperlipidemia, unspecified: Secondary | ICD-10-CM | POA: Diagnosis not present

## 2023-03-04 DIAGNOSIS — R7303 Prediabetes: Secondary | ICD-10-CM

## 2023-03-04 DIAGNOSIS — I10 Essential (primary) hypertension: Secondary | ICD-10-CM

## 2023-03-04 MED ORDER — TRAZODONE HCL 50 MG PO TABS: ORAL_TABLET | ORAL | 1 refills | Status: DC

## 2023-03-04 MED ORDER — TELMISARTAN 40 MG PO TABS: ORAL_TABLET | ORAL | 1 refills | Status: DC

## 2023-03-04 NOTE — Assessment & Plan Note (Signed)
S/p CABGx2 LIMA-LAD, RIMA-RCA and AVR with 25mm Edwards Inspiris bioprosthetic In August 2021 .  He remains asymptomatic. Marland KitchenContinue hi potency statin,  ARB and beta blocker.  He is asymptomatic.  LDL is at goal  Lab Results  Component Value Date   CHOL 94 05/23/2022   HDL 43 05/23/2022   LDLCALC 40 05/23/2022   LDLDIRECT 37 05/23/2022   TRIG 55 05/23/2022   CHOLHDL 2.2 05/23/2022

## 2023-03-04 NOTE — Assessment & Plan Note (Signed)
Advised to increase telmisartan dose to 40 mg daily as a trial and continue metoprlolol 25 mg bid.  Encoaurged to address anxiety that may be contributing

## 2023-03-04 NOTE — Assessment & Plan Note (Signed)
He has total revised his diet and has lost 12 lbs. A1c remains mildly elevated .  WILL RECHECK SEM IANNUALLY

## 2023-03-04 NOTE — Progress Notes (Signed)
Virtual Visit via Caregility   Note   This format is felt to be most appropriate for this patient at this time.  All issues noted in this document were discussed and addressed.  No physical exam was performed (except for noted visual exam findings with Video Visits).   I connected with Dean Obrien on 03/04/23 at 11:00 AM EST by a video enabled telemedicine application or telephoe and verified that I am speaking with the correct person using two identifiers. Location patient: home Location provider: work or home office Persons participating in the virtual visit: patient, provider  I discussed the limitations, risks, security and privacy concerns of performing an evaluation and management service by telephone and the availability of in person appointments. I also discussed with the patient that there may be a patient responsible charge related to this service. The patient expressed understanding and agreed to proceed. Reason for visit: follow up on hypertension   HPI:  To mis a 63 yr old male with CAD /sp CABG,  hypertension  who presets for BP management.    1) HTN:  Hypertension: patient  has been checkimg  blood pressure twice weekly at home.  Readings were average 150/85 for several weeks,  without any use of oral decongestants or NSAIDS.  Sine then he has reduced the sodium In his diet and reading have improved but not fallen below  130/80 at rest . He  is taking metoprolol tartrte 25 mg bid and tlemisartan 20 mg daily .    ROS: See pertinent positives and negatives per HPI.  Past Medical History:  Diagnosis Date   Aortic atherosclerosis (HCC)    Aortic stenosis, severe 09/09/2018   a.) RHC 09/10/19 Heavily calcified AV. VTI 0.42 cm. AV mean gradient 84.7 mmHg. Vmax measures 5.51 m/s. 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   LIMA-LAD and RIMA-dRCA   Hyperlipidemia LDL goal <70    8/19 LDL 87   Hypertension    Lumbar spinal stenosis    Lumbar spondylosis    Multiple pulmonary nodules determined by computed tomography of lung    NSTEMI (non-ST elevated myocardial infarction) (HCC) 09/09/2019   Postoperative atrial fibrillation (HCC) 09/14/2019   Tx'd with amiodarone gtt; converted to NSR   Prediabetes     Past Surgical History:  Procedure Laterality Date   AORTIC VALVE REPLACEMENT N/A 09/14/2019   Procedure: AORTIC VALVE REPLACEMENT (AVR) USING INSPIRIS VALVE SIZE ;  Surgeon: Linden Dolin, MD;  Location: [facility] OR;  Service: Open Heart Surgery;  Laterality: N/A;   CORONARY ARTERY BYPASS GRAFT N/A 09/14/2019   Procedure: CORONARY ARTERY BYPASS GRAFTING (CABG); LIMA-LAD and RIMA-dRCA;  Surgeon: Linden Dolin, MD;  Location: [facility] OR;  Service: Open Heart Surgery;  Laterality: N/A;   MASS EXCISION Right 08/28/2020   Procedure: EXCISION MASS;  Surgeon: Carolan Shiver, MD;  Location: [facility] ORS;  Service: General;  Laterality: Right;   RIGHT/LEFT HEART CATH AND CORONARY ANGIOGRAPHY N/A 09/10/2019   Procedure: RIGHT/LEFT HEART CATH AND CORONARY ANGIOGRAPHY;  Surgeon: Iran Ouch, MD;  Location: [facility] INVASIVE CV LAB;  Service: Cardiovascular;  Laterality: N/A;   TEE WITHOUT CARDIOVERSION N/A 09/14/2019  Procedure: TRANSESOPHAGEAL ECHOCARDIOGRAM (TEE);  Surgeon: Linden Dolin, MD;  Location: [facility] OR;  Service: Open Heart Surgery;  Laterality: N/A;    Family History  Problem Relation Age of Onset   Emphysema Mother    Liver disease Mother    Lung cancer Father    Hypertension Sister    Hypertension Brother     SOCIAL HX:  reports that he quit smoking about 3 years ago. His smoking use included cigarettes. He started smoking about 43 years ago. He has a 60  pack-year smoking history. He has never used smokeless tobacco. He reports that he does not drink alcohol and does not use drugs.    Current Outpatient Medications:    amoxicillin (AMOXIL) 500 MG capsule, Take 4 capsules (2,000 mg total) by mouth once as needed for up to 1 dose. 2 HOURS PRIOR TO ANY PROCEDURE INCLUDING DENTAL CLEANING, Disp: 4 capsule, Rfl: 1   aspirin EC 81 MG EC tablet, Take 1 tablet (81 mg total) by mouth daily. Swallow whole. (Patient taking differently: Take 81 mg by mouth in the morning.), Disp: 30 tablet, Rfl: 11   atorvastatin (LIPITOR) 40 MG tablet, TAKE 1 TABLET(40 MG) BY MOUTH DAILY, Disp: 90 tablet, Rfl: 2   fluticasone (FLONASE) 50 MCG/ACT nasal spray, SHAKE AND USE 2 SPRAYS IN EACH NOSTRIL DAILY, Disp: 48 g, Rfl: 3   gabapentin (NEURONTIN) 100 MG capsule, TAKE 2 CAPSULES BY MOUTH 4 TIMES DAILY, Disp: 240 capsule, Rfl: 6   loratadine (CLARITIN) 10 MG tablet, Take 10 mg by mouth in the morning., Disp: , Rfl:    metoprolol tartrate (LOPRESSOR) 25 MG tablet, TAKE 1 TABLET BY MOUTH TWICE DAILY, Disp: 180 tablet, Rfl: 3   naphazoline-pheniramine (ALLERGY EYE) 0.025-0.3 % ophthalmic solution, Place 1-2 drops into both eyes in the morning., Disp: , Rfl:    acetaminophen (TYLENOL) 500 MG tablet, Take 500-1,000 mg by mouth every 6 (six) hours as needed (pain). (Patient not taking: Reported on 03/04/2023), Disp: , Rfl:    telmisartan (MICARDIS) 40 MG tablet, TAKE 1 TABLET BY MOUTH AT BEDTIME, Disp: 90 tablet, Rfl: 1   traZODone (DESYREL) 50 MG tablet, TAKE 1/2 TO 1 TABLET BY MOUTH AT BEDTIME AS NEEDED FOR SLEEP, Disp: 90 tablet, Rfl: 1  EXAM:  VITALS per patient if applicable:  GENERAL: alert, oriented, appears well and in no acute distress  HEENT: atraumatic, conjunttiva clear, no obvious abnormalities on inspection of external nose and ears  NECK: normal movements of the head and neck  LUNGS: on inspection no signs of respiratory distress, breathing rate appears normal,  no obvious gross SOB, gasping or wheezing  CV: no obvious cyanosis  MS: moves all visible extremities without noticeable abnormality  PSYCH/NEURO: pleasant and cooperative, no obvious depression or anxiety, speech and thought processing grossly intact  ASSESSMENT AND PLAN: HYPERTENSION, BENIGN Assessment & Plan: Advised to increase telmisartan dose to 40 mg daily as a trial and continue metoprlolol 25 mg bid.  Encoaurged to address anxiety that may be contributing    Prediabetes Assessment & Plan: He has total revised his diet and has lost 12 lbs. A1c remains mildly elevated .  WILL RECHECK SEM IANNUALLY       Hyperlipidemia LDL goal <70 Assessment & Plan: LDL and triglycerides HAVE BEEN at goal on current medications. He has no side effects and liver enzymes are due for repeat  Lab Results  Component Value Date   CHOL 94 05/23/2022   HDL 43 05/23/2022  LDLCALC 40 05/23/2022   LDLDIRECT 37 05/23/2022   TRIG 55 05/23/2022   CHOLHDL 2.2 05/23/2022      Other orders -     traZODone HCl; TAKE 1/2 TO 1 TABLET BY MOUTH AT BEDTIME AS NEEDED FOR SLEEP  Dispense: 90 tablet; Refill: 1 -     Telmisartan; TAKE 1 TABLET BY MOUTH AT BEDTIME  Dispense: 90 tablet; Refill: 1      I discussed the assessment and treatment plan with the patient. The patient was provided an opportunity to ask questions and all were answered. The patient agreed with the plan and demonstrated an understanding of the instructions.   The patient was advised to call back or seek an in-person evaluation if the symptoms worsen or if the condition fails to improve as anticipated.   I spent 20 minutes dedicated to the care of this patient on the date of this encounter to include pre-visit review of his medical history,  Face-to-face time with the patient , and post visit ordering of testing and therapeutics.    Sherlene Shams, MD

## 2023-03-04 NOTE — Assessment & Plan Note (Signed)
LDL and triglycerides HAVE BEEN at goal on current medications. He has no side effects and liver enzymes are due for repeat  Lab Results  Component Value Date   CHOL 94 05/23/2022   HDL 43 05/23/2022   LDLCALC 40 05/23/2022   LDLDIRECT 37 05/23/2022   TRIG 55 05/23/2022   CHOLHDL 2.2 05/23/2022

## 2023-03-05 ENCOUNTER — Other Ambulatory Visit: Payer: Self-pay

## 2023-03-05 DIAGNOSIS — Z87891 Personal history of nicotine dependence: Secondary | ICD-10-CM

## 2023-03-05 DIAGNOSIS — Z122 Encounter for screening for malignant neoplasm of respiratory organs: Secondary | ICD-10-CM

## 2023-04-03 ENCOUNTER — Other Ambulatory Visit: Payer: Self-pay

## 2023-04-03 ENCOUNTER — Other Ambulatory Visit: Payer: Self-pay | Admitting: Internal Medicine

## 2023-04-03 MED ORDER — TELMISARTAN 40 MG PO TABS: ORAL_TABLET | ORAL | 0 refills | Status: DC

## 2023-05-22 ENCOUNTER — Telehealth: Payer: Self-pay | Admitting: Internal Medicine

## 2023-05-27 ENCOUNTER — Other Ambulatory Visit: Payer: Self-pay

## 2023-05-27 MED ORDER — GABAPENTIN 100 MG PO CAPS: ORAL_CAPSULE | ORAL | 6 refills | Status: AC

## 2023-06-27 ENCOUNTER — Ambulatory Visit: Attending: Medical | Admitting: Medical

## 2023-06-27 ENCOUNTER — Encounter: Payer: Self-pay | Admitting: Medical

## 2023-06-27 VITALS — BP 142/76 | HR 66 | Ht 63.0 in | Wt 168.0 lb

## 2023-06-27 DIAGNOSIS — I2510 Atherosclerotic heart disease of native coronary artery without angina pectoris: Secondary | ICD-10-CM

## 2023-06-27 DIAGNOSIS — Z951 Presence of aortocoronary bypass graft: Secondary | ICD-10-CM

## 2023-06-27 DIAGNOSIS — E782 Mixed hyperlipidemia: Secondary | ICD-10-CM

## 2023-06-27 DIAGNOSIS — Z953 Presence of xenogenic heart valve: Secondary | ICD-10-CM

## 2023-06-27 DIAGNOSIS — I5189 Other ill-defined heart diseases: Secondary | ICD-10-CM | POA: Diagnosis not present

## 2023-06-27 NOTE — Patient Instructions (Signed)
 Medication Instructions:   Your physician recommends that you continue on your current medications as directed. Please refer to the Current Medication list given to you today.  *If you need a refill on your cardiac medications before your next appointment, please call your pharmacy*  Lab Work: No labs ordered today  If you have labs (blood work) drawn today and your tests are completely normal, you will receive your results only by: MyChart Message (if you have MyChart) OR A paper copy in the mail If you have any lab test that is abnormal or we need to change your treatment, we will call you to review the results.  Testing/Procedures: Your physician has requested that you have an echocardiogram. Echocardiography is a painless test that uses sound waves to create images of your heart. It provides your doctor with information about the size and shape of your heart and how well your heart's chambers and valves are working.   You may receive an ultrasound enhancing agent through an IV if needed to better visualize your heart during the echo. This procedure takes approximately one hour.  There are no restrictions for this procedure.  This will take place at [address] [facility]) [address], [place] [address]  Please note: We ask at that you not bring children with you during ultrasound (echo/ vascular) testing. Due to room size and safety concerns, children are not allowed in the ultrasound rooms during exams. Our front office staff cannot provide observation of children in our lobby area while testing is being conducted. An adult accompanying a patient to their appointment will only be allowed in the ultrasound room at the discretion of the ultrasound technician under special circumstances. We apologize for any inconvenience.   Follow-Up: At [facility], you and your health needs are our priority.  As part of our continuing mission to provide you with exceptional heart  care, our providers are all part of one team.  This team includes your primary Cardiologist (physician) and Advanced Practice Providers or APPs (Physician Assistants and Nurse Practitioners) who all work together to provide you with the care you need, when you need it.  Your next appointment:   12 months  Provider:   You may see Timothy Gollan, MD or one of the following Advanced Practice Providers on your designated Care Team:    Cadence Ladysmith, PA-C

## 2023-06-27 NOTE — Progress Notes (Signed)
 Cardiology Office Note   Date:  06/27/2023  ID:  Artin Mceuen, DOB Aug 28, 1960, MRN [id number] PCP: Thersia Flax, MD  [facility] Providers Cardiologist:  Belva Boyden, MD   History of Present Illness Ahmeer Tuman is a 63 y.o. male with a hx of smoking, CAD, severe aortic stenosis, post-op Afib, CAD s/p CABG and aortic valve replacement 08/2019 who presents for 1 year follow-up.    On 08/25/19 he underwent CABGx2 LIMA-LAD, RIMA-RCA and AVR with 25mm Edwards Inspiris bioprosthetic. Post-op, he went into afib and was treated with amiodarone  drip.  Echo from 12/2021 showed LVEF 50-55%, no Wma, G2DD, mild MR, AVR, normally functioning valve.    The patient was last seen 05/23/22 and was overall stable from a cardiac perspective.   Today, the patient is overall doing well. He reported occasional elevated blood pressures at home. He saw PCP and telmisartan  was increased. At home, blood pressure is normally 118/60s. He denies chest pain, SOB, lower leg edema, palpitations, orthopnea, pnd, lightheadedness, dizziness. He stays active at home, does no formal exercise. Diet is Ok.  PCP has ordered annual labs.  Studies Reviewed EKG Interpretation Date/Time:  Friday June 27 2023 07:50:44 EDT Ventricular Rate:  68 PR Interval:  200 QRS Duration:  86 QT Interval:  382 QTC Calculation: 406 R Axis:   58  Text Interpretation: Normal sinus rhythm Normal ECG When compared with ECG of 01-Oct-2019 08:52, No significant change was found Confirmed by Gennaro Khat, Caleen Taaffe ([id number]) on 06/27/2023 8:04:50 AM    Echo 12/2021 1. Left ventricular ejection fraction, by estimation, is 50 to 55%. The  left ventricle has low normal function. The left ventricle has no regional  wall motion abnormalities. Left ventricular diastolic parameters are  consistent with Grade II diastolic  dysfunction (pseudonormalization).   2. Right ventricular systolic function is normal. The right ventricular  size is normal.   3.  The mitral valve is normal in structure. Mild mitral valve  regurgitation. No evidence of mitral stenosis.   4. The aortic valve has been repaired/replaced. Aortic valve  regurgitation is mild. There is a 25 mm Edwards bioprosthetic valve  present in the aortic position. Procedure Date: 09/14/19. Aortic valve mean  gradient measures 10.0 mmHg. Aortic valve Vmax  measures 2.16 m/s.   5. The inferior vena cava is normal in size with greater than 50%  respiratory variability, suggesting right atrial pressure of 3 mmHg.    Echo 10/2019  1. Left ventricular ejection fraction, by estimation, is 50 to 55%. The  left ventricle has low normal function. The left ventricle has no regional  wall motion abnormalities. Left ventricular diastolic parameters are  consistent with Grade II diastolic  dysfunction (pseudonormalization). Elevated left atrial pressure.   2. Right ventricular systolic function is moderately reduced. The right  ventricular size is normal.   3. A small pericardial effusion is present. The pericardial effusion is  circumferential.   4. The mitral valve is normal in structure. Trivial mitral valve  regurgitation. No evidence of mitral stenosis.   5. The aortic valve has been repaired/replaced. Aortic valve  regurgitation is not visualized. No aortic stenosis is present. There is a  25 mm Edwards Inspiris-Resilia valve present in the aortic position.  Procedure Date: 09/14/19.   6. There is borderline dilatation of the ascending aorta, measuring 36  mm.   7. The inferior vena cava is normal in size with greater than 50%  respiratory variability, suggesting right atrial pressure of 3 mmHg.  Physical Exam VS:  BP (!) 142/76   Pulse 66   Ht 5\' 3"  (1.6 m)   Wt 168 lb (76.2 kg)   SpO2 97%   BMI 29.76 kg/m    Wt Readings from Last 3 Encounters:  06/27/23 168 lb (76.2 kg)  03/04/23 160 lb (72.6 kg)  07/19/22 164 lb (74.4 kg)    GEN: Well nourished, well developed in  no acute distress NECK: No JVD; No carotid bruits CARDIAC: RRR, no murmurs, rubs, gallops RESPIRATORY:  Clear to auscultation without rales, wheezing or rhonchi  ABDOMEN: Soft, non-tender, non-distended EXTREMITIES:  No edema; No deformity   ASSESSMENT AND PLAN  Severe AS s/p AVR Diastolic dysfunction Echo in May 2023 showed normal LVEF, grade 2 diastolic dysfunction with normally functioning aortic valve.  Patient denies any shortness of breath.  EF.  Euvolemic on exam.  Activity is overall normal.  I will update echocardiogram.  Continue metoprolol  and telmisartan .  CAD s/p CABG x2 Patient denies any chest pain.  Continue aspirin , beta-blocker, and statin therapy.  Hyperlipidemia LDL 37.  Continue Lipitor 40mg  daily.   Prediabetes A1c 6.1.  This is followed by PCP.       Dispo: Follow-up in 1 year  Signed, Ladona Rosten Rebekah Canada, PA-C

## 2023-07-22 ENCOUNTER — Ambulatory Visit: Attending: Medical

## 2023-07-22 DIAGNOSIS — Z953 Presence of xenogenic heart valve: Secondary | ICD-10-CM

## 2023-07-23 LAB — ECHOCARDIOGRAM COMPLETE
AV Mean grad: 10 mmHg
AV Peak grad: 18.6 mmHg
Ao pk vel: 2.16 m/s
Area-P 1/2: 3.65 cm2
Calc EF: 56.4 %
S' Lateral: 2.9 cm
Single Plane A2C EF: 59.9 %
Single Plane A4C EF: 55.2 %

## 2023-07-30 ENCOUNTER — Ambulatory Visit: Payer: Self-pay | Admitting: Medical

## 2023-09-25 ENCOUNTER — Other Ambulatory Visit: Payer: Self-pay | Admitting: Cardiovascular Disease

## 2023-09-25 DIAGNOSIS — E785 Hyperlipidemia, unspecified: Secondary | ICD-10-CM

## 2023-10-14 ENCOUNTER — Other Ambulatory Visit: Payer: Self-pay | Admitting: Medical

## 2023-10-14 DIAGNOSIS — I25118 Atherosclerotic heart disease of native coronary artery with other forms of angina pectoris: Secondary | ICD-10-CM

## 2023-11-21 ENCOUNTER — Other Ambulatory Visit: Payer: Self-pay | Admitting: Cardiovascular Disease

## 2023-12-09 NOTE — Telephone Encounter (Signed)
 open in error

## 2023-12-26 ENCOUNTER — Other Ambulatory Visit: Payer: Self-pay | Admitting: Cardiovascular Disease

## 2023-12-26 DIAGNOSIS — E785 Hyperlipidemia, unspecified: Secondary | ICD-10-CM

## 2023-12-31 ENCOUNTER — Ambulatory Visit: Admitting: Dermatology

## 2023-12-31 ENCOUNTER — Encounter: Payer: Self-pay | Admitting: Dermatology

## 2023-12-31 DIAGNOSIS — B353 Tinea pedis: Secondary | ICD-10-CM | POA: Diagnosis not present

## 2023-12-31 DIAGNOSIS — Z85828 Personal history of other malignant neoplasm of skin: Secondary | ICD-10-CM

## 2023-12-31 DIAGNOSIS — L719 Rosacea, unspecified: Secondary | ICD-10-CM

## 2023-12-31 DIAGNOSIS — D692 Other nonthrombocytopenic purpura: Secondary | ICD-10-CM | POA: Diagnosis not present

## 2023-12-31 DIAGNOSIS — R238 Other skin changes: Secondary | ICD-10-CM | POA: Diagnosis not present

## 2023-12-31 DIAGNOSIS — L711 Rhinophyma: Secondary | ICD-10-CM

## 2023-12-31 DIAGNOSIS — L578 Other skin changes due to chronic exposure to nonionizing radiation: Secondary | ICD-10-CM

## 2023-12-31 DIAGNOSIS — L814 Other melanin hyperpigmentation: Secondary | ICD-10-CM

## 2023-12-31 DIAGNOSIS — L821 Other seborrheic keratosis: Secondary | ICD-10-CM

## 2023-12-31 DIAGNOSIS — Z1283 Encounter for screening for malignant neoplasm of skin: Secondary | ICD-10-CM

## 2023-12-31 DIAGNOSIS — Z7189 Other specified counseling: Secondary | ICD-10-CM

## 2023-12-31 DIAGNOSIS — W908XXA Exposure to other nonionizing radiation, initial encounter: Secondary | ICD-10-CM

## 2023-12-31 DIAGNOSIS — L603 Nail dystrophy: Secondary | ICD-10-CM | POA: Diagnosis not present

## 2023-12-31 DIAGNOSIS — B351 Tinea unguium: Secondary | ICD-10-CM

## 2023-12-31 DIAGNOSIS — Z79899 Other long term (current) drug therapy: Secondary | ICD-10-CM

## 2023-12-31 DIAGNOSIS — L820 Inflamed seborrheic keratosis: Secondary | ICD-10-CM

## 2023-12-31 DIAGNOSIS — D1801 Hemangioma of skin and subcutaneous tissue: Secondary | ICD-10-CM

## 2023-12-31 DIAGNOSIS — D229 Melanocytic nevi, unspecified: Secondary | ICD-10-CM

## 2023-12-31 DIAGNOSIS — D485 Neoplasm of uncertain behavior of skin: Secondary | ICD-10-CM

## 2023-12-31 NOTE — Progress Notes (Signed)
 Follow-Up Visit   Subjective  Dean Obrien is a 63 y.o. male who presents for the following: Skin Cancer Screening and Full Body Skin Exam  Hx of bcc, hx of isks and aks  Patient reports some skin tags and neck and chest he would like treated.   The patient presents for Total-Body Skin Exam (TBSE) for skin cancer screening and mole check. The patient has spots, moles and lesions to be evaluated, some may be new or changing and the patient may have concern these could be cancer.  The following portions of the chart were reviewed this encounter and updated as appropriate: medications, allergies, medical history  Review of Systems:  No other skin or systemic complaints except as noted in HPI or Assessment and Plan.  Objective  Well appearing patient in no apparent distress; mood and affect are within normal limits.  A full examination was performed including scalp, head, eyes, ears, nose, lips, neck, chest, axillae, abdomen, back, buttocks, bilateral upper extremities, bilateral lower extremities, hands, feet, fingers, toes, fingernails, and toenails. All findings within normal limits unless otherwise noted below.   Relevant physical exam findings are noted in the Assessment and Plan.  face x 6, neck and chest x 12 (18) Erythematous stuck-on, waxy papule or plaque  Assessment & Plan   HISTORY OF BASAL CELL CARCINOMA OF THE SKIN 09/11/2022 Right dorsum nose ED&C  - No evidence of recurrence today - Recommend regular full body skin exams - Recommend daily broad spectrum sunscreen SPF 30+ to sun-exposed areas, reapply every 2 hours as needed.  - Call if any new or changing lesions are noted between office visits  SKIN CANCER SCREENING PERFORMED TODAY.  ACTINIC DAMAGE - Chronic condition, secondary to cumulative UV/sun exposure - diffuse scaly erythematous macules with underlying dyspigmentation - Recommend daily broad spectrum sunscreen SPF 30+ to sun-exposed areas, reapply every 2  hours as needed.  - Staying in the shade or wearing long sleeves, sun glasses (UVA+UVB protection) and wide brim hats (4-inch brim around the entire circumference of the hat) are also recommended for sun protection.  - Call for new or changing lesions.  LENTIGINES, SEBORRHEIC KERATOSES, HEMANGIOMAS - Benign normal skin lesions - Benign-appearing - Call for any changes  MELANOCYTIC NEVI - Tan-brown and/or pink-flesh-colored symmetric macules and papules - Benign appearing on exam today - Observation - Call clinic for new or changing moles - Recommend daily use of broad spectrum spf 30+ sunscreen to sun-exposed areas.   Purpura - Chronic; persistent and recurrent.  Treatable, but not curable. - Violaceous macules and patches - Benign - Related to trauma, age, sun damage and/or use of blood thinners, chronic use of topical and/or oral steroids - Observe - Can use OTC arnica containing moisturizer such as Dermend Bruise Formula if desired - Call for worsening or other concerns  [place] Exam: red or purple papule at right earlobe and left ear helix  Treatment Plan: Benign-appearing. Observe   ROSACEA with Rhinophyma  Exam Mid face erythema with telangiectasias +/- scattered inflammatory papule  Chronic and persistent condition with duration or expected duration over one year. Condition is symptomatic/ bothersome to patient. Not currently at goal. Rosacea is a chronic progressive skin condition usually affecting the face of adults, causing redness and/or acne bumps. It is treatable but not curable. It sometimes affects the eyes (ocular rosacea) as well. It may respond to topical and/or systemic medication and can flare with stress, sun exposure, alcohol, exercise, topical steroids (including hydrocortisone/cortisone 10)  and some foods.  Daily application of broad spectrum spf 30+ sunscreen to face is recommended to reduce flares.  Patient denies grittiness of the eyes  Treatment  Plan Discussed could consider treatment if conditions worsens with more thickened skin at nose or bumps at face No recommended treatment at this time  TINEA PEDIS with Toenail Dystrophy  Exam: Scaling and maceration web spaces and over distal and lateral soles and toenail dystrophy Treatment Plan: Discussed treatment   Terbinafine  Counseling Terbinafine  is an anti-fungal medicine that can be applied to the skin (over the counter) or taken by mouth (prescription) to treat fungal infections. The pill version is often used to treat fungal infections of the nails or scalp. While most people do not have any side effects from taking terbinafine  pills, some possible side effects of the medicine can include taste changes, headache, loss of smell, vision changes, nausea, vomiting, or diarrhea.   Rare side effects can include irritation of the liver, allergic reaction, or decrease in blood counts (which may show up as not feeling well or developing an infection). If you are concerned about any of these side effects, please stop the medicine and call your doctor, or in the case of an emergency such as feeling very unwell, seek immediate medical care.   Reviewed patient's current medications - Terbinafine  will no couterreact with any of patient's current medications. Discussed.   Reviewed Liver function Test from 07/19/2022 - normal  Will order another hepatic Function test today If results are normal will prescribe Terbinafine  250 mg tab take 1 po qd for 1 month.  Discussed will take for 1 month after a month will call or send mychart letting us  know how they did medication meaning side effects. If ok will send in an additional 2 refills of medication   Patient would like rx sent to [facility]    INFLAMED SEBORRHEIC KERATOSIS (18) face x 6, neck and chest x 12 (18) Symptomatic, irritating, patient would like treated. Destruction of lesion - face x 6, neck and chest x 12 (18) Complexity: simple    Destruction method: cryotherapy   Informed consent: discussed and consent obtained   Timeout:  patient name, date of birth, surgical site, and procedure verified Lesion destroyed using liquid nitrogen: Yes   Region frozen until ice ball extended beyond lesion: Yes   Outcome: patient tolerated procedure well with no complications   Post-procedure details: wound care instructions given    MEDICATION MANAGEMENT   Related Procedures Hepatic Function Panel Return in about 1 year (around 12/30/2024) for TBSE.  IEleanor Blush, CMA, am acting as scribe for Alm Rhyme, MD.   Documentation: I have reviewed the above documentation for accuracy and completeness, and I agree with the above.  Alm Rhyme, MD

## 2023-12-31 NOTE — Patient Instructions (Addendum)
 Will call once we get lab results for treatment of fungus at toes and feet  If ok will start Terbinafine  250 mg tab by mouth once daily for 1 month. Once finished will all pills send mychart or call and let us  know how you did while on medication. If no side effects will send in another 2 refills of medication.    Terbinafine  Counseling  Terbinafine  is an anti-fungal medicine that can be applied to the skin (over the counter) or taken by mouth (prescription) to treat fungal infections. The pill version is often used to treat fungal infections of the nails or scalp. While most people do not have any side effects from taking terbinafine  pills, some possible side effects of the medicine can include taste changes, headache, loss of smell, vision changes, nausea, vomiting, or diarrhea.   Rare side effects can include irritation of the liver, allergic reaction, or decrease in blood counts (which may show up as not feeling well or developing an infection). If you are concerned about any of these side effects, please stop the medicine and call your doctor, or in the case of an emergency such as feeling very unwell, seek immediate medical care.     Seborrheic Keratosis  What causes seborrheic keratoses? Seborrheic keratoses are harmless, common skin growths that first appear during adult life.  As time goes by, more growths appear.  Some people may develop a large number of them.  Seborrheic keratoses appear on both covered and uncovered body parts.  They are not caused by sunlight.  The tendency to develop seborrheic keratoses can be inherited.  They vary in color from skin-colored to gray, brown, or even black.  They can be either smooth or have a rough, warty surface.   Seborrheic keratoses are superficial and look as if they were stuck on the skin.  Under the microscope this type of keratosis looks like layers upon layers of skin.  That is why at times the top layer may seem to fall off, but the rest of  the growth remains and re-grows.    Treatment Seborrheic keratoses do not need to be treated, but can easily be removed in the office.  Seborrheic keratoses often cause symptoms when they rub on clothing or jewelry.  Lesions can be in the way of shaving.  If they become inflamed, they can cause itching, soreness, or burning.  Removal of a seborrheic keratosis can be accomplished by freezing, burning, or surgery. If any spot bleeds, scabs, or grows rapidly, please return to have it checked, as these can be an indication of a skin cancer.   Cryotherapy Aftercare  Wash gently with soap and water  everyday.   Apply Vaseline and Band-Aid daily until healed.     Melanoma ABCDEs  Melanoma is the most dangerous type of skin cancer, and is the leading cause of death from skin disease.  You are more likely to develop melanoma if you: Have light-colored skin, light-colored eyes, or red or blond hair Spend a lot of time in the sun Tan regularly, either outdoors or in a tanning bed Have had blistering sunburns, especially during childhood Have a close family member who has had a melanoma Have atypical moles or large birthmarks  Early detection of melanoma is key since treatment is typically straightforward and cure rates are extremely high if we catch it early.   The first sign of melanoma is often a change in a mole or a new dark spot.  The ABCDE system  is a way of remembering the signs of melanoma.  A for asymmetry:  The two halves do not match. B for border:  The edges of the growth are irregular. C for color:  A mixture of colors are present instead of an even brown color. D for diameter:  Melanomas are usually (but not always) greater than 6mm - the size of a pencil eraser. E for evolution:  The spot keeps changing in size, shape, and color.  Please check your skin once per month between visits. You can use a small mirror in front and a large mirror behind you to keep an eye on the back side  or your body.   If you see any new or changing lesions before your next follow-up, please call to schedule a visit.  Please continue daily skin protection including broad spectrum sunscreen SPF 30+ to sun-exposed areas, reapplying every 2 hours as needed when you're outdoors.   Staying in the shade or wearing long sleeves, sun glasses (UVA+UVB protection) and wide brim hats (4-inch brim around the entire circumference of the hat) are also recommended for sun protection.    Due to recent changes in healthcare laws, you may see results of your pathology and/or laboratory studies on MyChart before the doctors have had a chance to review them. We understand that in some cases there may be results that are confusing or concerning to you. Please understand that not all results are received at the same time and often the doctors may need to interpret multiple results in order to provide you with the best plan of care or course of treatment. Therefore, we ask that you please give us  2 business days to thoroughly review all your results before contacting the office for clarification. Should we see a critical lab result, you will be contacted sooner.   If You Need Anything After Your Visit  If you have any questions or concerns for your doctor, please call our main line at [phone] and press option 4 to reach your doctor's medical assistant. If no one answers, please leave a voicemail as directed and we will return your call as soon as possible. Messages left after 4 pm will be answered the following business day.   You may also send us  a message via MyChart. We typically respond to MyChart messages within 1-2 business days.  For prescription refills, please ask your pharmacy to contact our office. Our fax number is [phone].  If you have an urgent issue when the clinic is closed that cannot wait until the next business day, you can page your doctor at the number below.    Please note that while  we do our best to be available for urgent issues outside of office hours, we are not available 24/7.   If you have an urgent issue and are unable to reach us , you may choose to seek medical care at your doctor's office, retail clinic, urgent care center, or emergency room.  If you have a medical emergency, please immediately call 911 or go to the emergency department.  Pager Numbers  - Dr. Hester: [phone]  - Dr. Jackquline: [phone]  - Dr. Claudene: [phone]   - Dr. Raymund: [phone]  In the event of inclement weather, please call our main line at [phone] for an update on the status of any delays or closures.  Dermatology Medication Tips: Please keep the boxes that topical medications come in in order to help keep track of the instructions about where  and how to use these. Pharmacies typically print the medication instructions only on the boxes and not directly on the medication tubes.   If your medication is too expensive, please contact our office at [phone] option 4 or send us  a message through MyChart.   We are unable to tell what your co-pay for medications will be in advance as this is different depending on your insurance coverage. However, we may be able to find a substitute medication at lower cost or fill out paperwork to get insurance to cover a needed medication.   If a prior authorization is required to get your medication covered by your insurance company, please allow us  1-2 business days to complete this process.  Drug prices often vary depending on where the prescription is filled and some pharmacies may offer cheaper prices.  The website www.goodrx.com contains coupons for medications through different pharmacies. The prices here do not account for what the cost may be with help from insurance (it may be cheaper with your insurance), but the website can give you the price if you did not use any insurance.  - You can print the associated coupon and  take it with your prescription to the pharmacy.  - You may also stop by our office during regular business hours and pick up a GoodRx coupon card.  - If you need your prescription sent electronically to a different pharmacy, notify our office through [facility] or by phone at [phone] option 4.     Si Usted Necesita Algo Despus de Su Visita  Tambin puede enviarnos un mensaje a travs de Clinical Cytogeneticist. Por lo general respondemos a los mensajes de MyChart en el transcurso de 1 a 2 das hbiles.  Para renovar recetas, por favor pida a su farmacia que se ponga en contacto con nuestra oficina. Randi lakes de fax es [place] [phone].  Si tiene un asunto urgente cuando la clnica est cerrada y que no puede esperar hasta el siguiente da hbil, puede llamar/localizar a su doctor(a) al nmero que aparece a continuacin.   Por favor, tenga en cuenta que aunque hacemos todo lo posible para estar disponibles para asuntos urgentes fuera del horario de [place], no estamos disponibles las 24 horas del da, los 7 [address]  [address] de la [place].   Si tiene un problema urgente y no puede comunicarse con nosotros, puede optar por buscar atencin mdica  en el consultorio de su doctor(a), en una clnica privada, en un centro de atencin urgente o en una sala de emergencias.  Si tiene engineer, drilling, por favor llame inmediatamente al 911 o vaya a la sala de emergencias.  Nmeros de bper  - Dr. Hester: [phone]  - Dra. Jackquline: [phone]  - Dr. Claudene: [phone]  - Dra. Kitts: [phone]  En caso de inclemencias del [place], por favor llame a nuestra lnea principal al [phone] para una actualizacin sobre el estado de cualquier retraso o cierre.  Consejos para la medicacin en dermatologa: Por favor, guarde las cajas en las que vienen los medicamentos de uso tpico para ayudarle a seguir las instrucciones sobre dnde y cmo usarlos. Las farmacias generalmente imprimen las  instrucciones del medicamento slo en las cajas y no directamente en los tubos del [place].   Si su medicamento es muy caro, por favor, pngase en contacto con landry rieger llamando al [phone] y presione la opcin 4 o envenos un mensaje a travs de Clinical Cytogeneticist.   No podemos decirle cul ser su copago por los medicamentos por  adelantado ya que esto es diferente dependiendo de la cobertura de su seguro. Sin embargo, es posible que podamos encontrar un medicamento sustituto a audiological scientist un formulario para que el seguro cubra el medicamento que se considera necesario.   Si se requiere una autorizacin previa para que su compaa de seguros cubra su medicamento, por favor permtanos de 1 a 2 das hbiles para completar este proceso.  Los precios de los medicamentos varan con frecuencia dependiendo del environmental consultant de dnde se surte la receta y alguna farmacias pueden ofrecer precios ms baratos.  El sitio web www.goodrx.com tiene cupones para medicamentos de health and safety inspector. Los precios aqu no tienen en cuenta lo que podra costar con la ayuda del seguro (puede ser ms barato con su seguro), pero el sitio web puede darle el precio si no utiliz tourist information centre manager.  - Puede imprimir el cupn correspondiente y llevarlo con su receta a la farmacia.  - Tambin puede pasar por nuestra oficina durante el horario de atencin regular y education officer, museum una tarjeta de cupones de GoodRx.  - Si necesita que su receta se enve electrnicamente a una farmacia diferente, informe a nuestra oficina a travs de MyChart de [place] o por telfono llamando al [phone] y presione la opcin 4.

## 2024-01-01 ENCOUNTER — Ambulatory Visit: Payer: Self-pay | Admitting: Dermatology

## 2024-01-01 DIAGNOSIS — B353 Tinea pedis: Secondary | ICD-10-CM

## 2024-01-01 DIAGNOSIS — B351 Tinea unguium: Secondary | ICD-10-CM

## 2024-01-01 LAB — HEPATIC FUNCTION PANEL
ALT: 23 IU/L (ref 0–44)
AST: 19 IU/L (ref 0–40)
Albumin: 4.3 g/dL (ref 3.9–4.9)
Alkaline Phosphatase: 73 IU/L (ref 47–123)
Bilirubin Total: 0.4 mg/dL (ref 0.0–1.2)
Bilirubin, Direct: 0.17 mg/dL (ref 0.00–0.40)
Total Protein: 7.7 g/dL (ref 6.0–8.5)

## 2024-01-01 MED ORDER — TERBINAFINE HCL 250 MG PO TABS: 250.0000 mg | ORAL_TABLET | Freq: Every day | ORAL | 0 refills | Status: DC

## 2024-01-01 NOTE — Telephone Encounter (Addendum)
 Called and discussed lab results and went over medication instructions, advised patient to send mychart in 1 month letting us  know how he tolerated medication. If he tolerated ok will sent an additional 2 refills   ----- Message from Alm Rhyme, MD sent at 01/01/2024 12:58 PM EST ----- Hepatic Function Panel from 12/10.2025 was NORMAL. Please advise pt and may send: Oral Lamisil  (terbinafine ) 250 mg 1 po every day #30 0rf. After finishing 1 month, pt to contact us  (by MyChart?) to let us  know how tolerated. If he did OK, we may send 2 rf for a total 3 mos

## 2024-01-24 ENCOUNTER — Encounter: Payer: Self-pay | Admitting: Dermatology

## 2024-01-24 DIAGNOSIS — B351 Tinea unguium: Secondary | ICD-10-CM

## 2024-01-24 DIAGNOSIS — B353 Tinea pedis: Secondary | ICD-10-CM

## 2024-01-26 MED ORDER — TERBINAFINE HCL 250 MG PO TABS: 250.0000 mg | ORAL_TABLET | Freq: Every day | ORAL | 1 refills | Status: AC

## 2024-02-26 ENCOUNTER — Ambulatory Visit
Admission: RE | Admit: 2024-02-26 | Discharge: 2024-02-26 | Disposition: A | Source: Ambulatory Visit | Attending: Acute Care | Admitting: Acute Care

## 2024-02-26 DIAGNOSIS — Z122 Encounter for screening for malignant neoplasm of respiratory organs: Secondary | ICD-10-CM

## 2024-02-26 DIAGNOSIS — Z87891 Personal history of nicotine dependence: Secondary | ICD-10-CM

## 2024-03-01 ENCOUNTER — Other Ambulatory Visit

## 2024-03-15 ENCOUNTER — Ambulatory Visit: Admitting: Internal Medicine

## 2024-12-30 ENCOUNTER — Ambulatory Visit: Admitting: Dermatology
